# Patient Record
Sex: Male | Born: 1945 | Race: White | Hispanic: No | Marital: Single | Smoking: Current every day smoker
Health system: Southern US, Community
[De-identification: ages and names within clinical notes are randomized; demographics above are authoritative.]

## PROBLEM LIST (undated history)

## (undated) DIAGNOSIS — I10 Essential (primary) hypertension: Secondary | ICD-10-CM

## (undated) DIAGNOSIS — M160 Bilateral primary osteoarthritis of hip: Secondary | ICD-10-CM

## (undated) DIAGNOSIS — M5136 Other intervertebral disc degeneration, lumbar region: Secondary | ICD-10-CM

## (undated) DIAGNOSIS — R51 Headache: Secondary | ICD-10-CM

## (undated) DIAGNOSIS — R519 Headache, unspecified: Secondary | ICD-10-CM

## (undated) HISTORY — DX: Bilateral primary osteoarthritis of hip: M16.0

## (undated) HISTORY — DX: Headache, unspecified: R51.9

## (undated) HISTORY — PX: CATARACT EXTRACTION, BILATERAL: SHX1313

## (undated) HISTORY — DX: Headache: R51

## (undated) HISTORY — DX: Other intervertebral disc degeneration, lumbar region: M51.36

## (undated) HISTORY — DX: Essential (primary) hypertension: I10

---

## 1978-01-31 HISTORY — PX: WISDOM TOOTH EXTRACTION: SHX21

## 2017-02-01 ENCOUNTER — Encounter: Payer: Self-pay | Admitting: Family Medicine

## 2017-02-01 ENCOUNTER — Ambulatory Visit (INDEPENDENT_AMBULATORY_CARE_PROVIDER_SITE_OTHER): Payer: PRIVATE HEALTH INSURANCE | Admitting: Family Medicine

## 2017-02-01 VITALS — BP 196/118 | HR 86 | Temp 97.4°F | Resp 16 | Wt 184.0 lb

## 2017-02-01 DIAGNOSIS — Z23 Encounter for immunization: Secondary | ICD-10-CM

## 2017-02-01 DIAGNOSIS — M160 Bilateral primary osteoarthritis of hip: Secondary | ICD-10-CM

## 2017-02-01 DIAGNOSIS — Z01818 Encounter for other preprocedural examination: Secondary | ICD-10-CM | POA: Diagnosis not present

## 2017-02-01 DIAGNOSIS — F17200 Nicotine dependence, unspecified, uncomplicated: Secondary | ICD-10-CM

## 2017-02-01 DIAGNOSIS — I10 Essential (primary) hypertension: Secondary | ICD-10-CM

## 2017-02-01 LAB — CBC WITH DIFFERENTIAL/PLATELET
BASOS ABS: 0.1 10*3/uL (ref 0.0–0.1)
BASOS PCT: 0.9 % (ref 0.0–3.0)
Eosinophils Absolute: 0.6 10*3/uL (ref 0.0–0.7)
Eosinophils Relative: 7.7 % — ABNORMAL HIGH (ref 0.0–5.0)
HEMATOCRIT: 43.6 % (ref 39.0–52.0)
Hemoglobin: 14.8 g/dL (ref 13.0–17.0)
LYMPHS PCT: 15.3 % (ref 12.0–46.0)
Lymphs Abs: 1.3 10*3/uL (ref 0.7–4.0)
MCHC: 33.8 g/dL (ref 30.0–36.0)
MCV: 96.1 fl (ref 78.0–100.0)
MONOS PCT: 8.1 % (ref 3.0–12.0)
Monocytes Absolute: 0.7 10*3/uL (ref 0.1–1.0)
NEUTROS ABS: 5.6 10*3/uL (ref 1.4–7.7)
NEUTROS PCT: 68 % (ref 43.0–77.0)
PLATELETS: 241 10*3/uL (ref 150.0–400.0)
RBC: 4.54 Mil/uL (ref 4.22–5.81)
RDW: 13.2 % (ref 11.5–15.5)
WBC: 8.3 10*3/uL (ref 4.0–10.5)

## 2017-02-01 LAB — COMPREHENSIVE METABOLIC PANEL
ALBUMIN: 4.8 g/dL (ref 3.5–5.2)
ALT: 10 U/L (ref 0–53)
AST: 19 U/L (ref 0–37)
Alkaline Phosphatase: 84 U/L (ref 39–117)
BUN: 10 mg/dL (ref 6–23)
CALCIUM: 10.2 mg/dL (ref 8.4–10.5)
CHLORIDE: 94 meq/L — AB (ref 96–112)
CO2: 30 meq/L (ref 19–32)
CREATININE: 0.69 mg/dL (ref 0.40–1.50)
GFR: 120.5 mL/min (ref >60.00)
Glucose, Bld: 93 mg/dL (ref 70–99)
Potassium: 4.3 mEq/L (ref 3.5–5.1)
Sodium: 133 mEq/L — ABNORMAL LOW (ref 135–145)
Total Bilirubin: 0.6 mg/dL (ref 0.2–1.2)
Total Protein: 7.4 g/dL (ref 6.0–8.3)

## 2017-02-01 LAB — TSH: TSH: 2.86 u[IU]/mL (ref 0.35–4.50)

## 2017-02-01 MED ORDER — HYDROCHLOROTHIAZIDE 25 MG PO TABS: 25.0000 mg | ORAL_TABLET | Freq: Every day | ORAL | 1 refills | Status: DC

## 2017-02-01 MED ORDER — OXYCODONE HCL 5 MG PO TABS: ORAL_TABLET | ORAL | 0 refills | Status: DC

## 2017-02-01 NOTE — Patient Instructions (Signed)
Buy a blood pressure cuff at your pharmacy (upper arm cuff). Check blood pressure and heart rate twice per day and write these numbers down and bring them back to next office visit to review with me.

## 2017-02-01 NOTE — Progress Notes (Signed)
Office Note 02/01/2017  CC:  Chief Complaint  Patient presents with  . Establish Care    surgical clearance - Right hip surgery    HPI:  Randy Burgess is a 72 y.o.  male who is here accompanied by a friend to establish care and hopes to get preoperative clearance for upcoming total R hip replacement by Dr. Merlyn Albert (03/15/2017). Patient's most recent primary MD: none Old records were not reviewed prior to or during today's visit.  Pt has never gone to the doctor with any regularity, has no prior PCP that we could get records from. Says bp has been elevated at MD visits in the past but he says getting on bp med was never recommended. Has been having hip pain for several years now, much worse the last few months.  He saw a chiropracter for intermittent LBP and he got x-rays of LB and ?hip.  He was referred to Dr. 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 Pain intensity at rest, sitting, is 5/10.  With weight bearing it goes up to 8-9/10.  He walks with a cane primarily, but for long distances he uses a wheelchair.  He has been taking aleve/ibup and aspirin regularly for pain but this doesn't help much at all. He is not (has never been) on any narcotic pain med for his hip pain.  He has no known hx of MI/CAD, CHF, cardiac valvular abnormality, dysrhythmia, heart block, or other cardiac problem. No hx of COPD or asthma or OSA. Before being relatively incapacitated by hip pain, he was able to power walk, go up flights of stairs, and do heavy physical labor in his yard w/out CP or unusual SOB.  No palpitations,dizziness arm pain, jaw pain, or LE swelling.  He has never had surgery before.  No hx of abnormal bleeding.  No hx of thrombosis/PE.   Past Medical History:  Diagnosis Date  . DDD (degenerative disc disease), lumbar   . Elevated blood pressure reading without diagnosis of hypertension   . Frequent headaches    . Osteoarthritis of both hips    R>>L--due for THA 03/15/16 with Dr. Lequita Halt    Past Surgical History:  Procedure Laterality Date  . WISDOM TOOTH EXTRACTION  1980   No excessive bleeding and no problems with the light sedation he was given.    Family History  Problem Relation Age of Onset  . Diabetes Father   . Cancer Daughter     Social History   Socioeconomic History  . Marital status: Single    Spouse name: Not on file  . Number of children: Not on file  . Years of education: Not on file  . Highest education level: Not on file  Social Needs  . Financial resource strain: Not on file  . Food insecurity - worry: Not on file  . Food insecurity - inability: Not on file  . Transportation needs - medical: Not on file  . Transportation needs - non-medical: Not on file  Occupational History  . Not on file  Tobacco Use  . Smoking status: Current Every Day Smoker    Packs/day: 1.00    Years: 55.00    Pack years: 55.00    Types: Cigarettes  . Smokeless tobacco: Current User  Substance and Sexual Activity  . Alcohol use: Yes    Alcohol/week: 4.8 oz    Types: 8 Cans of beer  per week  . Drug use: No  . Sexual activity: Not on file  Other Topics Concern  . Not on file  Social History Narrative   Single, no children.   Educ: college   Occup: retired from [employer].   Born in [place], grew up in [place].   [place]   Tobacco: 50 pack-yr history--current as of 02/01/17.   Alcohol:mild/moderate   No drug use/abuse.    Outpatient Encounter Medications as of 02/01/2017  Medication Sig  . aspirin 325 MG EC tablet Take 325 mg by mouth daily.  . Multiple Vitamin (MULTIVITAMIN) capsule Take 1 capsule by mouth daily.  . hydrochlorothiazide (HYDRODIURIL) 25 MG tablet Take 1 tablet (25 mg total) by mouth daily.  Marland Kitchen. oxyCODONE (OXY IR/ROXICODONE) 5 MG immediate release tablet 1-2 tabs po tid prn moderate to severe pain   No facility-administered encounter medications  on file as of 02/01/2017.     Allergies  Allergen Reactions  . Penicillins   . Sulfa Antibiotics     ROS Review of Systems  Constitutional: Negative for appetite change, chills, fatigue and fever.  HENT: Negative for congestion, dental problem, ear pain and sore throat.   Eyes: Negative for discharge, redness and visual disturbance.  Respiratory: Negative for cough, chest tightness, shortness of breath and wheezing.   Cardiovascular: Negative for chest pain, palpitations and leg swelling.  Gastrointestinal: Negative for abdominal pain, blood in stool, diarrhea, nausea and vomiting.  Genitourinary: Negative for difficulty urinating, dysuria, flank pain, frequency, hematuria and urgency.  Musculoskeletal: Positive for arthralgias (right hip, as per HPI). Negative for back pain, joint swelling, myalgias and neck stiffness.  Skin: Negative for pallor and rash.  Neurological: Negative for dizziness, speech difficulty, weakness and headaches.  Hematological: Negative for adenopathy. Does not bruise/bleed easily.  Psychiatric/Behavioral: Negative for confusion and sleep disturbance. The patient is not nervous/anxious.     PE; Initial bp today was 200/130 Blood pressure (!) 196/118, pulse 86, temperature (!) 97.4 F (36.3 C), temperature source Oral, resp. rate 16, weight 184 lb (83.5 kg), SpO2 99 %. There is no height or weight on file to calculate BMI. (pt could not stand in order to get a height measurement.  Gen: Alert, well appearing.  Patient is oriented to person, place, time, and situation.  Sitting in wheelchair. AFFECT: pleasant, lucid thought and speech. NFA:OZHYENT:Eyes: no injection, icteris, swelling, or exudate.  EOMI, PERRLA. Mouth: lips without lesion/swelling.  Oral mucosa pink and moist. Oropharynx without erythema, exudate, or swelling.  Neck: supple/nontender.  No LAD, mass, or TM.  Carotid pulses 2+ bilaterally, without bruits. CV: RRR, no m/r/g.   LUNGS: CTA bilat, nonlabored  resps, good aeration in all lung fields. ABD: soft, NT, ND, BS normal.  No hepatospenomegaly or mass.  No bruits. EXT: no clubbing, cyanosis, or edema.  SKIN: no pallor or jaundice.  Pertinent labs:  No results found for: TSH No results found for: WBC, HGB, HCT, MCV, PLT No results found for: CREATININE, BUN, NA, K, CL, CO2 No results found for: ALT, AST, GGT, ALKPHOS, BILITOT No results found for: CHOL No results found for: HDL No results found for: LDLCALC No results found for: TRIG No results found for: CHOLHDL No results found for: PSA  12 lead EKG today: NSR, rate 87, poor R wave progression, no ST/T changes, no Q waves. LVH criteria by voltage in aVL.  No ectopy.  PR and QT intervals normal. QRS duration normal. NO prior EKG for  comparison.  ASSESSMENT AND PLAN:   New pt:  Here for preoperative clearance for R THA that is already scheduled for 03/15/17 with Dr. Lequita Halt. Pt has never really gotten any regular preventative medical care or routine medical checks.  1) Uncontrolled HTN: start hctz 25mg  qd. Stop NSAIDS/ASA.   Buy a blood pressure cuff at your pharmacy (upper arm cuff). Check blood pressure and heart rate twice per day and write these numbers down and bring them back to next office visit to review with me. Check CBC, CMET, and TSH today. Need to get bp down into normal range prior to surgical clearance. No cardiac testing planned at this time since functional capacity has been good and he is asymptomatic.  2) Bilat hip DJD, end stage on R---start oxycodone 5mg , 1-2 tid prn pain, #60.  Stop NSAIDs as stated above. Plan for R TKA as stated above.  3) Tobacco dependence: not covered too much today. Will address further at future visits---benefit of quitting even for a week or two prior to surgery --better healing, less chance of DVT.  4) Preventative health: Tdap and flu vaccines given today. Offer shingrix at future visit, although we'll likely hold off until  after his hip surgery to give this. Spent 45 min with pt today, with >50% of this time spent in counseling and care coordination regarding the above problems.  An After Visit Summary was printed and given to the patient.  Return in about 7 days (around 02/08/2017) for f/u uncontrolled HTN.  Signed:  Santiago Bumpers, MD           02/01/2017

## 2017-02-08 ENCOUNTER — Ambulatory Visit (INDEPENDENT_AMBULATORY_CARE_PROVIDER_SITE_OTHER): Payer: PRIVATE HEALTH INSURANCE | Admitting: Family Medicine

## 2017-02-08 ENCOUNTER — Encounter: Payer: Self-pay | Admitting: *Deleted

## 2017-02-08 VITALS — BP 166/106 | HR 91 | Temp 97.6°F | Resp 16 | Wt 182.5 lb

## 2017-02-08 DIAGNOSIS — I10 Essential (primary) hypertension: Secondary | ICD-10-CM

## 2017-02-08 MED ORDER — AMLODIPINE BESY-BENAZEPRIL HCL 10-20 MG PO CAPS
1.0000 | ORAL_CAPSULE | Freq: Every day | ORAL | 1 refills | Status: AC
Start: 2017-02-08 — End: ?

## 2017-02-08 NOTE — Patient Instructions (Signed)
-   STOP taking aspirin   -

## 2017-02-08 NOTE — Progress Notes (Signed)
OFFICE VISIT  02/08/2017   CC:  Chief Complaint  Patient presents with  . Follow-up    HTN   HPI:    Patient is a 72 y.o. Caucasian male who presents accompanied by a friend for 7 day f/u uncontrolled HTN. Started HCTZ 25mg  qd last visit.  We need to get his bp consistently <140/90 before he can get his upcoming R THR that is scheduled for 03/15/17 with Dr. Lequita HaltAluisio. CBC, CMET, TSH all good at visit 1 week ago.  Compliant with bp med, home bp's still up 160s-190s over 90s-100s. No HA's, dizziness, CP, or SOB. No focal weakness. No side effect from med.  Past Medical History:  Diagnosis Date  . DDD (degenerative disc disease), lumbar   . Essential hypertension    + LVH on EKG 02/01/17.  Started hctz 25mg  qd 02/01/17.  . Frequent headaches   . Osteoarthritis of both hips    R>>L--due for THA 03/15/16 with Dr. Lequita HaltAluisio    Past Surgical History:  Procedure Laterality Date  . CATARACT EXTRACTION, BILATERAL    . WISDOM TOOTH EXTRACTION  1980   No excessive bleeding and no problems with the light sedation he was given.    Outpatient Medications Prior to Visit  Medication Sig Dispense Refill  . hydrochlorothiazide (HYDRODIURIL) 25 MG tablet Take 1 tablet (25 mg total) by mouth daily. 30 tablet 1  . Multiple Vitamin (MULTIVITAMIN) capsule Take 1 capsule by mouth daily.    Marland Kitchen. oxyCODONE (OXY IR/ROXICODONE) 5 MG immediate release tablet 1-2 tabs po tid prn moderate to severe pain 60 tablet 0  . aspirin 325 MG EC tablet Take 325 mg by mouth daily.     No facility-administered medications prior to visit.     Allergies  Allergen Reactions  . Penicillins   . Sulfa Antibiotics     ROS As per HPI  PE: Blood pressure (!) 166/106, pulse 91, temperature 97.6 F (36.4 C), temperature source Oral, resp. rate 16, weight 182 lb 8 oz (82.8 kg), SpO2 95 %. Gen: Alert, well appearing.  Patient is oriented to person, place, time, and situation. AFFECT: pleasant, lucid thought and speech. CV:  RRR, no m/r/g.   LUNGS: CTA bilat, nonlabored resps, good aeration in all lung fields. EXT: no clubbing, cyanosis, or edema.    LABS:  Lab Results  Component Value Date   TSH 2.86 02/01/2017   Lab Results  Component Value Date   WBC 8.3 02/01/2017   HGB 14.8 02/01/2017   HCT 43.6 02/01/2017   MCV 96.1 02/01/2017   PLT 241.0 02/01/2017   Lab Results  Component Value Date   CREATININE 0.69 02/01/2017   BUN 10 02/01/2017   NA 133 (L) 02/01/2017   K 4.3 02/01/2017   CL 94 (L) 02/01/2017   CO2 30 02/01/2017   Lab Results  Component Value Date   ALT 10 02/01/2017   AST 19 02/01/2017   ALKPHOS 84 02/01/2017   BILITOT 0.6 02/01/2017    IMPRESSION AND PLAN:  Uncontrolled HTN: not much improved on hctz 25mg  qd for the last 1 wk. Continue hctz 25mg  and add lotrel 10-20 qd. Stop aspirin--he doesn't really have any indication for this med. Continue home bp and HR monitoring.  An After Visit Summary was printed and given to the patient.  FOLLOW UP: Return for f/u HTN in 12-14d. --recheck BMET at that time.  Signed:  Santiago BumpersPhil Maija Biggers, MD           02/08/2017

## 2017-02-09 ENCOUNTER — Other Ambulatory Visit: Payer: Self-pay | Admitting: Family Medicine

## 2017-02-09 MED ORDER — OXYCODONE HCL 5 MG PO TABS: ORAL_TABLET | ORAL | 0 refills | Status: DC

## 2017-02-09 NOTE — Telephone Encounter (Signed)
Copied from CRM [phone]. Topic: Quick Communication - Rx Refill/Question >> Feb 09, 2017  1:39 PM Eston Mouldavis, Lev Cervone B wrote: Medication: oxyCODONE (OXY IR/ROXICODONE) 5 MG immediate release tablet   Has the patient contacted their pharmacy? yes   (Agent: If no, request that the patient contact the pharmacy for the refill.)   Preferred Pharmacy (with phone number or street name): [id number] [place], [place] - [address] [phone] (Phone) [phone] (Fax)  **   Agent: Please be advised that RX refills may take up to 3 business days. We ask that you follow-up with your pharmacy.

## 2017-02-09 NOTE — Telephone Encounter (Signed)
RF request for oxycodone LOV: 02/08/17 Next ov: 02/20/17 Last written: 02/01/17 #60 w/ 0RF  Please advise. Thanks.

## 2017-02-09 NOTE — Telephone Encounter (Signed)
Rx put up front for p/u. Pt advised and voiced understanding.   

## 2017-02-14 ENCOUNTER — Ambulatory Visit: Payer: Self-pay | Admitting: Orthopedic Surgery

## 2017-02-14 ENCOUNTER — Encounter: Payer: Self-pay | Admitting: Family Medicine

## 2017-02-20 ENCOUNTER — Encounter: Payer: Self-pay | Admitting: Family Medicine

## 2017-02-20 ENCOUNTER — Ambulatory Visit (INDEPENDENT_AMBULATORY_CARE_PROVIDER_SITE_OTHER): Payer: PRIVATE HEALTH INSURANCE | Admitting: Family Medicine

## 2017-02-20 VITALS — BP 134/75 | HR 85 | Temp 97.6°F | Resp 16

## 2017-02-20 DIAGNOSIS — I10 Essential (primary) hypertension: Secondary | ICD-10-CM

## 2017-02-20 NOTE — Progress Notes (Signed)
OFFICE VISIT  02/20/2017   CC:  Chief Complaint  Patient presents with  . Follow-up    HTN   HPI:    Patient is a 72 y.o. Caucasian male who presents for f/u HTN. Last visit (17 days ago) I added lotrel 10-20 qd to his regimen (he was only on hctz 25mg  qd at that time). Goal BP prior to surgical clearance is < 140/90 consistently.  Home bp monitoring: gradually came down over the last 1 week to 130s/70s. He feels well other than his hip pains. He does not need any oxycodone at this time: he has some at home and is using this regularly.  Past Medical History:  Diagnosis Date  . DDD (degenerative disc disease), lumbar   . Essential hypertension    + LVH on EKG 02/01/17.  Started hctz 25mg  qd 02/01/17.  . Frequent headaches   . 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 Lequita HaltAluisio    Past Surgical History:  Procedure Laterality Date  . CATARACT EXTRACTION, BILATERAL    . WISDOM TOOTH EXTRACTION  1980   No excessive bleeding and no problems with the light sedation he was given.    Outpatient Medications Prior to Visit  Medication Sig Dispense Refill  . amLODipine-benazepril (LOTREL) 10-20 MG capsule Take 1 capsule by mouth daily. 30 capsule 1  . hydrochlorothiazide (HYDRODIURIL) 25 MG tablet Take 1 tablet (25 mg total) by mouth daily. 30 tablet 1  . Multiple Vitamin (MULTIVITAMIN) capsule Take 1 capsule by mouth daily.    Marland Kitchen. oxyCODONE (OXY IR/ROXICODONE) 5 MG immediate release tablet 1-2 tabs po tid prn moderate to severe pain 60 tablet 0   No facility-administered medications prior to visit.     Allergies  Allergen Reactions  . Penicillins   . Sulfa Antibiotics     ROS As per HPI  PE: Blood pressure 134/75, pulse 85, temperature 97.6 F (36.4 C), temperature source Oral, resp. rate 16, SpO2 100 %. Gen: Alert, well appearing.  Patient is oriented to person, place, time, and situation. AFFECT:  pleasant, lucid thought and speech. No further exam today.  LABS:    Chemistry      Component Value Date/Time   NA 133 (L) 02/01/2017 1510   K 4.3 02/01/2017 1510   CL 94 (L) 02/01/2017 1510   CO2 30 02/01/2017 1510   BUN 10 02/01/2017 1510   CREATININE 0.69 02/01/2017 1510      Component Value Date/Time   CALCIUM 10.2 02/01/2017 1510   ALKPHOS 84 02/01/2017 1510   AST 19 02/01/2017 1510   ALT 10 02/01/2017 1510   BILITOT 0.6 02/01/2017 1510     IMPRESSION AND PLAN:  Essential HTN: now controlled fine--I'll fill out his form clearing him for surgery. He'll continue current bp meds. Instructions:  If your blood pressure at home is consistently >150 on top or >90 on bottom, call our office or make a return appointment.    FOLLOW UP: Return in about 3 months (around 05/21/2017) for routine chronic illness f/u.  Signed:  Santiago BumpersPhil Jakita Dutkiewicz, MD           02/20/2017

## 2017-02-20 NOTE — Patient Instructions (Signed)
If your blood pressure at home is consistently >150 on top or >90 on bottom, call our office or make a return appointment.    Good luck with your surgery!

## 2017-02-21 ENCOUNTER — Other Ambulatory Visit: Payer: Self-pay | Admitting: Family Medicine

## 2017-02-21 NOTE — Telephone Encounter (Signed)
Oxycodone refill. Chart shows that prescription was printed to be filled on 02/09/17. Pt has contacted pharmacy and pharmacy has not received medication. Last OV 02/20/17.

## 2017-02-21 NOTE — Telephone Encounter (Signed)
Pt was advised on 02/09/17 that his prescription was ready for pick up at our front desk. Pt advised again and advised that we are unable to send this medication to his pharmacy because it is a controlled medication. Pt voiced understanding.

## 2017-02-21 NOTE — Telephone Encounter (Signed)
Copied from CRM [phone]. Topic: Quick Communication - Rx Refill/Question >> Feb 21, 2017 11:36 AM Jolayne Hainesaylor, Brittany L wrote: Medication: oxyCODONE (OXY IR/ROXICODONE) 5 MG immediate release tablet  Has the patient contacted their pharmacy? [facility] & they told him to call provider   (Agent: If no, request that the patient contact the pharmacy for the refill.)   Preferred Pharmacy (with phone number or street name): [id number] - [place], [place] - [address]   Agent: Please be advised that RX refills may take up to 3 business days. We ask that you follow-up with your pharmacy.

## 2017-02-23 ENCOUNTER — Telehealth: Payer: Self-pay | Admitting: Family Medicine

## 2017-02-23 MED ORDER — OXYCODONE HCL 5 MG PO TABS: ORAL_TABLET | ORAL | 0 refills | Status: DC

## 2017-02-23 NOTE — Telephone Encounter (Signed)
Pt called In in regards to his medication pick up. Pt says that he sent his friend for pick up and was told that Rx wasn't there. Spoke with office and was informed that Rx discussed was picked up on 02/10/17. Explained to pt, he expressed understanding.   Pt would like to know if provider could provide him with another (new) Rx as he needs more pain med? When speaking with office they did advise that request was sent to provider. I informed pt that office would call him when/ if able to supply and when Rx is  Ready for pick up.    Pt says that he would like to have a Rx available to him as soon as possible.

## 2017-02-23 NOTE — Telephone Encounter (Signed)
Oxycodone rx printed. 

## 2017-02-23 NOTE — Telephone Encounter (Signed)
Patient notified that prescription is ready for pick up. He verbalized understanding.

## 2017-02-23 NOTE — Telephone Encounter (Signed)
Rx placed at front desk.   Pt advised.

## 2017-02-23 NOTE — Telephone Encounter (Signed)
Patient's friend, Gladstone Pihlias came into the office this afternoon stating pt was supposed to have RX for oxycodone ready.  Patient's friend states that he was told that the RX would be ready.  Patient was called yesterday and told to pick up rx.  RX for 02/09/17 was already picked up on 02/10/17.    Please advise.

## 2017-02-24 ENCOUNTER — Other Ambulatory Visit (HOSPITAL_COMMUNITY): Payer: Self-pay | Admitting: *Deleted

## 2017-02-24 NOTE — Patient Instructions (Signed)
Randy PickerelKenneth Burgess  02/24/2017   Your procedure is scheduled on: 03-15-17  [facility]  [facility]tr[facility]  Report to admitting at 1000 AM  Call this number if you have problems the morning of surgery [phone]   Remember: Do not eat food or drink liquids :After Midnight.     Take these medicines the morning of surgery with A SIP OF WATER:  Oxycodone IR if needed, eye drop if needed, ranitidine (zantac)                                You may not have any metal on your body including hair pins and              piercings  Do not wear jewelry, make-up, lotions, powders or perfumes, deodorant             Do not wear nail polish.  Do not shave  48 hours prior to surgery.              Men may shave face and neck.   Do not bring valuables to the hospital. [facility] IS NOT             RESPONSIBLE   FOR VALUABLES.  Contacts, dentures or bridgework may not be worn into surgery.  Leave suitcase in the car. After surgery it may be brought to your room.                  Please read over the following fact sheets you were given: _____________________________________________________________________             [facility] - Preparing for [facility] Before surgery, you can play an important role.  Because skin is not sterile, your skin needs to be as free of germs as possible.  You can reduce the number of germs on your skin by washing with CHG (chlorahexidine gluconate) soap before surgery.  CHG is an antiseptic cleaner which kills germs and bonds with the skin to continue killing germs even after washing. Please DO NOT use if you have an allergy to CHG or antibacterial soaps.  If your skin becomes reddened/irritated stop using the CHG and inform your nurse when you arrive at Short Stay. Do not shave (including legs and underarms) for at least 48 hours prior to the first CHG shower.  You may shave your face/neck. Please follow these instructions carefully:  1.  Shower  with CHG Soap the night before surgery and the  morning of Surgery.  2.  If you choose to wash your hair, wash your hair first as usual with your  normal  shampoo.  3.  After you shampoo, rinse your hair and body thoroughly to remove the  shampoo.                           4.  Use CHG as you would any other liquid soap.  You can apply chg directly  to the skin and wash                       Gently with a scrungie or clean washcloth.  5.  Apply the CHG Soap to your body ONLY FROM THE NECK DOWN.   Do not use on face/ open  Wound or open sores. Avoid contact with eyes, ears mouth and genitals (private parts).                       Wash face,  Genitals (private parts) with your normal soap.             6.  Wash thoroughly, paying special attention to the area where your surgery  will be performed.  7.  Thoroughly rinse your body with warm water from the neck down.  8.  DO NOT shower/wash with your normal soap after using and rinsing off  the CHG Soap.                9.  Pat yourself dry with a clean towel.            10.  Wear clean pajamas.            11.  Place clean sheets on your bed the night of your first shower and do not  sleep with pets. Day of Surgery : Do not apply any lotions/deodorants the morning of surgery.  Please wear clean clothes to the hospital/surgery center.  FAILURE TO FOLLOW THESE INSTRUCTIONS MAY RESULT IN THE CANCELLATION OF YOUR SURGERY PATIENT SIGNATURE_________________________________  NURSE SIGNATURE__________________________________  ________________________________________________________________________   Randy Burgess  An incentive spirometer is a tool that can help keep your lungs clear and active. This tool measures how well you are filling your lungs with each breath. Taking long deep breaths may help reverse or decrease the chance of developing breathing (pulmonary) problems (especially infection) following:  A long period  of time when you are unable to move or be active. BEFORE THE PROCEDURE   If the spirometer includes an indicator to show your best effort, your nurse or respiratory therapist will set it to a desired goal.  If possible, sit up straight or lean slightly forward. Try not to slouch.  Hold the incentive spirometer in an upright position. INSTRUCTIONS FOR USE  1. Sit on the edge of your bed if possible, or sit up as far as you can in bed or on a chair. 2. Hold the incentive spirometer in an upright position. 3. Breathe out normally. 4. Place the mouthpiece in your mouth and seal your lips tightly around it. 5. Breathe in slowly and as deeply as possible, raising the piston or the ball toward the top of the column. 6. Hold your breath for 3-5 seconds or for as long as possible. Allow the piston or ball to fall to the bottom of the column. 7. Remove the mouthpiece from your mouth and breathe out normally. 8. Rest for a few seconds and repeat Steps 1 through 7 at least 10 times every 1-2 hours when you are awake. Take your time and take a few normal breaths between deep breaths. 9. The spirometer may include an indicator to show your best effort. Use the indicator as a goal to work toward during each repetition. 10. After each set of 10 deep breaths, practice coughing to be sure your lungs are clear. If you have an incision (the cut made at the time of surgery), support your incision when coughing by placing a pillow or rolled up towels firmly against it. Once you are able to get out of bed, walk around indoors and cough well. You may stop using the incentive spirometer when instructed by your caregiver.  RISKS AND COMPLICATIONS  Take your time so you do not get  dizzy or light-headed.  If you are in pain, you may need to take or ask for pain medication before doing incentive spirometry. It is harder to take a deep breath if you are having pain. AFTER USE  Rest and breathe slowly and easily.  It  can be helpful to keep track of a log of your progress. Your caregiver can provide you with a simple table to help with this. If you are using the spirometer at home, follow these instructions: [place] IF:   You are having difficultly using the spirometer.  You have trouble using the spirometer as often as instructed.  Your pain medication is not giving enough relief while using the spirometer.  You develop fever of 100.5 F (38.1 C) or higher. SEEK IMMEDIATE MEDICAL CARE IF:   You cough up bloody sputum that had not been present before.  You develop fever of 102 F (38.9 C) or greater.  You develop worsening pain at or near the incision site. MAKE SURE YOU:   Understand these instructions.  Will watch your condition.  Will get help right away if you are not doing well or get worse. Document Released: 05/30/2006 Document Revised: 04/11/2011 Document Reviewed: 07/31/2006 ExitCare Patient Information 2014 ExitCare, [place].   ________________________________________________________________________  WHAT IS A BLOOD TRANSFUSION? Blood Transfusion Information  A transfusion is the replacement of blood or some of its parts. Blood is made up of multiple cells which provide different functions.  Red blood cells carry oxygen and are used for blood loss replacement.  White blood cells fight against infection.  Platelets control bleeding.  Plasma helps clot blood.  Other blood products are available for specialized needs, such as hemophilia or other clotting disorders. BEFORE THE TRANSFUSION  Who gives blood for transfusions?   Healthy volunteers who are fully evaluated to make sure their blood is safe. This is blood bank blood. Transfusion therapy is the safest it has ever been in the practice of medicine. Before blood is taken from a donor, a complete history is taken to make sure that person has no history of diseases nor engages in risky social behavior (examples  are intravenous drug use or sexual activity with multiple partners). The donor's travel history is screened to minimize risk of transmitting infections, such as malaria. The donated blood is tested for signs of infectious diseases, such as HIV and hepatitis. The blood is then tested to be sure it is compatible with you in order to minimize the chance of a transfusion reaction. If you or a relative donates blood, this is often done in anticipation of surgery and is not appropriate for emergency situations. It takes many days to process the donated blood. RISKS AND COMPLICATIONS Although transfusion therapy is very safe and saves many lives, the main dangers of transfusion include:   Getting an infectious disease.  Developing a transfusion reaction. This is an allergic reaction to something in the blood you were given. Every precaution is taken to prevent this. The decision to have a blood transfusion has been considered carefully by your caregiver before blood is given. Blood is not given unless the benefits outweigh the risks. AFTER THE TRANSFUSION  Right after receiving a blood transfusion, you will usually feel much better and more energetic. This is especially true if your red blood cells have gotten low (anemic). The transfusion raises the level of the red blood cells which carry oxygen, and this usually causes an energy increase.  The nurse administering the transfusion will  monitor you carefully for complications. HOME CARE INSTRUCTIONS  No special instructions are needed after a transfusion. You may find your energy is better. Speak with your caregiver about any limitations on activity for underlying diseases you may have. SEEK MEDICAL CARE IF:   Your condition is not improving after your transfusion.  You develop redness or irritation at the intravenous (IV) site. SEEK IMMEDIATE MEDICAL CARE IF:  Any of the following symptoms occur over the next 12 hours:  Shaking chills.  You have a  temperature by mouth above 102 F (38.9 C), not controlled by medicine.  Chest, back, or muscle pain.  People around you feel you are not acting correctly or are confused.  Shortness of breath or difficulty breathing.  Dizziness and fainting.  You get a rash or develop hives.  You have a decrease in urine output.  Your urine turns a dark color or changes to pink, red, or brown. Any of the following symptoms occur over the next 10 days:  You have a temperature by mouth above 102 F (38.9 C), not controlled by medicine.  Shortness of breath.  Weakness after normal activity.  The white part of the eye turns yellow (jaundice).  You have a decrease in the amount of urine or are urinating less often.  Your urine turns a dark color or changes to pink, red, or brown. Document Released: 01/15/2000 Document Revised: 04/11/2011 Document Reviewed: 09/03/2007 [facility] Patient Information 2014 [place], [place].  _______________________________________________________________________

## 2017-02-24 NOTE — Progress Notes (Signed)
ekg 02-01-17 and  medical clearance note dr Milinda Cavemcgowen epic

## 2017-02-28 ENCOUNTER — Other Ambulatory Visit: Payer: Self-pay

## 2017-02-28 ENCOUNTER — Encounter (HOSPITAL_COMMUNITY): Payer: Self-pay

## 2017-02-28 ENCOUNTER — Encounter (HOSPITAL_COMMUNITY)
Admission: RE | Admit: 2017-02-28 | Discharge: 2017-02-28 | Disposition: A | Discharge status: Home / Self Care | Payer: 59 | Source: Ambulatory Visit | Attending: Orthopedic Surgery | Admitting: Orthopedic Surgery

## 2017-02-28 DIAGNOSIS — Z01812 Encounter for preprocedural laboratory examination: Secondary | ICD-10-CM | POA: Insufficient documentation

## 2017-02-28 LAB — COMPREHENSIVE METABOLIC PANEL
ALT: 17 U/L (ref 17–63)
ANION GAP: 11 (ref 5–15)
AST: 24 U/L (ref 15–41)
Albumin: 4.7 g/dL (ref 3.5–5.0)
Alkaline Phosphatase: 78 U/L (ref 38–126)
BUN: 9 mg/dL (ref 6–20)
CHLORIDE: 91 mmol/L — AB (ref 101–111)
CO2: 22 mmol/L (ref 22–32)
Calcium: 10.2 mg/dL (ref 8.9–10.3)
Creatinine, Ser: 0.58 mg/dL — ABNORMAL LOW (ref 0.61–1.24)
GFR calc Af Amer: >60 mL/min (ref >60)
Glucose, Bld: 106 mg/dL — ABNORMAL HIGH (ref 65–99)
POTASSIUM: 3.6 mmol/L (ref 3.5–5.1)
Sodium: 124 mmol/L — ABNORMAL LOW (ref 135–145)
Total Bilirubin: 0.4 mg/dL (ref 0.3–1.2)
Total Protein: 7.6 g/dL (ref 6.5–8.1)

## 2017-02-28 LAB — PROTIME-INR
INR: 1.02
PROTHROMBIN TIME: 13.3 s (ref 11.4–15.2)

## 2017-02-28 LAB — SURGICAL PCR SCREEN
MRSA, PCR: NEGATIVE
Staphylococcus aureus: POSITIVE — AB

## 2017-02-28 LAB — CBC
HCT: 35.7 % — ABNORMAL LOW (ref 39.0–52.0)
Hemoglobin: 13.1 g/dL (ref 13.0–17.0)
MCH: 31.7 pg (ref 26.0–34.0)
MCHC: 36.7 g/dL — ABNORMAL HIGH (ref 30.0–36.0)
MCV: 86.4 fL (ref 78.0–100.0)
PLATELETS: 261 10*3/uL (ref 150–400)
RBC: 4.13 MIL/uL — ABNORMAL LOW (ref 4.22–5.81)
RDW: 12.1 % (ref 11.5–15.5)
WBC: 7.2 10*3/uL (ref 4.0–10.5)

## 2017-02-28 LAB — APTT: APTT: 30 s (ref 24–36)

## 2017-03-09 ENCOUNTER — Other Ambulatory Visit: Payer: Self-pay | Admitting: Family Medicine

## 2017-03-09 MED ORDER — OXYCODONE HCL 5 MG PO TABS: ORAL_TABLET | ORAL | 0 refills | Status: DC

## 2017-03-09 NOTE — Telephone Encounter (Signed)
RF request for oxycodone LOV: 02/23/17 Next ov: None Last written: 02/23/17 #90 w/ 0RF  Please advise. Thanks.

## 2017-03-09 NOTE — Telephone Encounter (Signed)
Rx put up front for p/u. Pt advised and voiced understanding.   

## 2017-03-09 NOTE — Telephone Encounter (Signed)
Copied from CRM #[id number]. Topic: Quick Communication - Rx Refill/Question >> Mar 09, 2017 11:17 AM Lelon FrohlichGolden, Tashia, RMA wrote: Medication: oxycodone 5 mg   Has the patient contacted their pharmacy? No controlled   (Agent: If no, request that the patient contact the pharmacy for the refill.)   Preferred Pharmacy (with phone number or street name[facility] [place] [address]   Agent: Please be advised that RX refills may take up to 3 business days. We ask that you follow-up with your pharmacy.

## 2017-03-12 ENCOUNTER — Ambulatory Visit: Payer: Self-pay | Admitting: Orthopedic Surgery

## 2017-03-12 NOTE — H&P (View-Only) (Signed)
Randy Burgess, Randy Burgess(72yo, [place])  DOB [birth year]   Chief Complaint Right Hip Pain   Patient's Care Team [facility] [place]: [address], [place], [place] [id number], Ph [phone], Fax [phone]  [facility]'s Pharmacies [facility] [id number] [address], [place] [id number], Ph [phone]) [phone], Fax [phone]) [phone]   Vitals Ht: 5 ft 11 in 02/28/2017 05:09 pm Wt: 179 lbs 02/28/2017 05:16 pm BMI: 25 02/28/2017 05:16 pm BP - 152/78 Pulse - 84   Allergies Reviewed Allergies PENICILLINS: - Childhood RXN  SULFA (SULFONAMIDE ANTIBIOTICS): - Childhood RXN    Medications Reviewed Medications amlodipine 10 mg-atorvastatin 10 mg tablet 02/28/17   entered ALEXZANDREW PERKINS, PA-C amlodipine 10 mg-benazepril 20 mg capsule 02/08/17   filled [facility] Daily Multi-Vitamin 02/28/17   entered ALEXZANDREW PERKINS, PA-C hydroCHLOROthiazide 25 mg tablet 02/28/17   filled [facility] oxyCODONE 5 mg tablet 02/24/17   filled [facility]   Problems Reviewed Problems Osteoarthritis of hip    Family History Reviewed Family History Father - Diabetes mellitus (died age: 1965)   Social History Reviewed Social History Smoking Status: Current every day smoker Smoker (1 PPD) Tobacco-years of use: 50 Chewing tobacco: none Alcohol intake: Occasional Hand Dominance: Right Work related injury?: N Advance directive: N Medical Power of Attorney: N   Surgical History Reviewed Surgical History Cataract    Past Medical History Reviewed Past Medical History Headaches: Y Joint Pain: Y    HPI Patient is a 72 year old male who is scheduled for a right total hip replacement arthroplasty by Dr. Ollen GrossFrank Aluisio on March 15[facility]. The patient is a 72 year old male who was seen in referral from Dr. Sherrie SportBoudreau. He has been having progressively worsening pain in the right groin and thigh over the past few years. He uses a cane to walk but his  activity is severely limited due to his pain. He used to be very active. He reports that he does not have much left hip pain. He has seen a chiropractor previously as there was some thought that the pain was coming from his low back. He did recently get x-rays of his hips and was referred here. He has never had injections or surgery on the hips. He has pain in the right groin that radiates into the anterior thigh and to the right knee. He has some right buttocks pain and right lateral hip pain as well. No numbness or tingling in the legs. He will take Ibuprofen from time to time but it does not make a huge improvement in his symptoms. 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 RIGHT is more symptomatic so we will do that first. He asked about doing both the same time and is not a candidate to do that. We will proceed with the RIGHT hip as planned and based on how he does with the RIGHT we will set up the LEFT one shortly afterwards. We discussed the procedure, risks, potential complications and rehab course in detail and will proceed with surgery as planned   ROS Constitutional: Constitutional: no significant weight gain or loss and no fever.  HEENT: Eyes: no irritation, dry eyes, vision change, or sore throat.  Cardiovascular: Cardiovascular: no palpitations or chest pain.  Respiratory: Respiratory: no cough or shortness of breath and No COPD.  Gastrointestinal: Gastrointestinal: no vomiting or diarrhea and not vomiting blood.  Genitourinary: Genitourinary: no blood in urine or difficulty urinating.  Musculoskeletal: Musculoskeletal: Joint  Pain.   Physical Exam Patient is a 72 year old male.  General Patient is seen and examined in a wheelchair. Mental Status - Alert, cooperative and good historian. General Appearance - pleasant, Not in acute distress. Orientation - Oriented X3. Build & Nutrition - Well nourished and Well developed.  Head and  Neck Head - normocephalic, atraumatic . Neck Global Assessment - supple, no bruit auscultated on the right, no bruit auscultated on the left.  Eye Pupil - Bilateral - PERR Motion - Bilateral - EOMI.  Chest and Lung Exam Auscultation Breath sounds - clear at anterior chest wall and clear at posterior chest wall. Adventitious sounds - No Adventitious sounds.  Cardiovascular Auscultation Rhythm - Regular rate and rhythm. Heart Sounds - S1 WNL and S2 WNL. Murmurs & Other Heart Sounds - Auscultation of the heart reveals - No Murmurs.  Abdomen Palpation/Percussion Tenderness - Abdomen is non-tender to palpation. Abdomen is soft. Auscultation Auscultation of the abdomen reveals - Bowel sounds normal.  Male Genitourinary Note: Not done, not pertinent to present illness  Musculoskeletal He has pain with passive and active motion of the right hip which is significantly limited. Flexion 100 degrees, no internal rotation, external rotation and abduction 15 degrees. He has some tenderness over the right greater trochanteric bursa. No tenderness over the left. He has no pain with motion of the left hip but motion is comparable to that of the right hip. Normal motion of the knees. No tenderness or effusion in the knees. Sensation and motor function intact.   Radiographs: AP and lateral views of the hips show severe degeneration of both the right and left hips, right greater than left. Both femoral heads have collapse with significant cystic change vs necrosis present. Significant spurring noted about the acetabular rim, most notably along the inferior rim.    Assessment / Plan 1. Osteoarthritis of right hip joint M16.11: Unilateral primary osteoarthritis, right hip  2. Osteoarthritis of left hip joint M16.12: Unilateral primary osteoarthritis, left hip  Patient Instructions Surgical Plans: Right Total Hip Replacement - Anterior Approach Disposition: Home, HHPT vs. HEP PCP: [facility] Anesthesia Issues: None Patient was instructed on what medications to stop prior to surgery. - Follow up visit in 2 weeks with Dr. Lequita Halt - Begin physical therapy following surgery - Pre-operative lab work as pre Pre-Surgical Testing - Prescriptions will be provided in hospital at time of discharge  Return to Office Ollen Gross, MD for Post-Op at [facility] on 03/28/2017 at 02:00 PM  Encounter signed-off by Patrica Duel, PA-C

## 2017-03-12 NOTE — H&P (Signed)
Randy Burgess, Randy Burgess(72yo, [place])  DOB [birth year]   Chief Complaint Right Hip Pain   Patient's Care Team [facility] [place] [address], [place], [place] [id number], Ph [phone], Fax [phone]  Patient's Pharmacies [facility] [phone]): [address], [place] [id number], Ph (336) [phone], Fax [phone]) [phone]   Vitals Ht: 5 ft 11 in 02/28/2017 05:09 pm Wt: 179 lbs 02/28/2017 05:16 pm BMI: 25 02/28/2017 05:16 pm BP - 152/78 Pulse - 84   Allergies Reviewed Allergies PENICILLINS: - Childhood RXN  SULFA (SULFONAMIDE ANTIBIOTICS): - Childhood RXN    Medications Reviewed Medications amlodipine 10 mg-atorvastatin 10 mg tablet 02/28/17   entered Rebbie Lauricella, PA-C amlodipine 10 mg-benazepril 20 mg capsule 02/08/17   filled [facility] Daily Multi-Vitamin 02/28/17   entered Micole Delehanty, PA-C hydroCHLOROthiazide 25 mg tablet 02/28/17   filled [facility] oxyCODONE 5 mg tablet 02/24/17   filled [facility]   Problems Reviewed Problems Osteoarthritis of hip    Family History Reviewed Family History Father - Diabetes mellitus (died age: 1965)   Social History Reviewed Social History Smoking Status: Current every day smoker Smoker (1 PPD) Tobacco-years of use: 50 Chewing tobacco: none Alcohol intake: Occasional Hand Dominance: Right Work related injury?: N Advance directive: N Medical Power of Attorney: N   Surgical History Reviewed Surgical History Cataract    Past Medical History Reviewed Past Medical History Headaches: Y Joint Pain: Y    HPI Patient is a 72 year old male who is scheduled for a right total hip replacement arthroplasty by Dr. Ollen GrossFrank Aluisio on March 15, 2017 [facility]. The patient is a 72 year old male who was seen in referral from Dr. Sherrie SportBoudreau. He has been having progressively worsening pain in the right groin and thigh over the past few years. He uses a cane to walk but his  activity is severely limited due to his pain. He used to be very active. He reports that he does not have much left hip pain. He has seen a chiropractor previously as there was some thought that the pain was coming from his low back. He did recently get x-rays of his hips and was referred here. He has never had injections or surgery on the hips. He has pain in the right groin that radiates into the anterior thigh and to the right knee. He has some right buttocks pain and right lateral hip pain as well. No numbness or tingling in the legs. He will take Ibuprofen from time to time but it does not make a huge improvement in his symptoms. 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 RIGHT is more symptomatic so we will do that first. He asked about doing both the same time and is not a candidate to do that. We will proceed with the RIGHT hip as planned and based on how he does with the RIGHT we will set up the LEFT one shortly afterwards. We discussed the procedure, risks, potential complications and rehab course in detail and will proceed with surgery as planned   ROS Constitutional: Constitutional: no significant weight gain or loss and no fever.  HEENT: Eyes: no irritation, dry eyes, vision change, or sore throat.  Cardiovascular: Cardiovascular: no palpitations or chest pain.  Respiratory: Respiratory: no cough or shortness of breath and No COPD.  Gastrointestinal: Gastrointestinal: no vomiting or diarrhea and not vomiting blood.  Genitourinary: Genitourinary: no blood in urine or difficulty urinating.  Musculoskeletal: Musculoskeletal: Joint  Pain.   Physical Exam Patient is a 72 year old male.  General Patient is seen and examined in a wheelchair. Mental Status - Alert, cooperative and good historian. General Appearance - pleasant, Not in acute distress. Orientation - Oriented X3. Build & Nutrition - Well nourished and Well developed.  Head and  Neck Head - normocephalic, atraumatic . Neck Global Assessment - supple, no bruit auscultated on the right, no bruit auscultated on the left.  Eye Pupil - Bilateral - PERR Motion - Bilateral - EOMI.  Chest and Lung Exam Auscultation Breath sounds - clear at anterior chest wall and clear at posterior chest wall. Adventitious sounds - No Adventitious sounds.  Cardiovascular Auscultation Rhythm - Regular rate and rhythm. Heart Sounds - S1 WNL and S2 WNL. Murmurs & Other Heart Sounds - Auscultation of the heart reveals - No Murmurs.  Abdomen Palpation/Percussion Tenderness - Abdomen is non-tender to palpation. Abdomen is soft. Auscultation Auscultation of the abdomen reveals - Bowel sounds normal.  Male Genitourinary Note: Not done, not pertinent to present illness  Musculoskeletal He has pain with passive and active motion of the right hip which is significantly limited. Flexion 100 degrees, no internal rotation, external rotation and abduction 15 degrees. He has some tenderness over the right greater trochanteric bursa. No tenderness over the left. He has no pain with motion of the left hip but motion is comparable to that of the right hip. Normal motion of the knees. No tenderness or effusion in the knees. Sensation and motor function intact.   Radiographs: AP and lateral views of the hips show severe degeneration of both the right and left hips, right greater than left. Both femoral heads have collapse with significant cystic change vs necrosis present. Significant spurring noted about the acetabular rim, most notably along the inferior rim.    Assessment / Plan 1. Osteoarthritis of right hip joint M16.11: Unilateral primary osteoarthritis, right hip  2. Osteoarthritis of left hip joint M16.12: Unilateral primary osteoarthritis, left hip  Patient Instructions Surgical Plans: Right Total Hip Replacement - Anterior Approach Disposition: Home, HHPT vs. HEP PCP: [facility] Anesthesia Issues: None Patient was instructed on what medications to stop prior to surgery. - Follow up visit in 2 weeks with Dr. Lequita Halt - Begin physical therapy following surgery - Pre-operative lab work as pre Pre-Surgical Testing - Prescriptions will be provided in hospital at time of discharge  Return to Office Ollen Gross, MD for Post-Op at [facility] on 03/28/2017 at 02:00 PM  Encounter signed-off by Patrica Duel, PA-C

## 2017-03-14 MED ORDER — TRANEXAMIC ACID 1000 MG/10ML IV SOLN
1000.0000 mg | INTRAVENOUS | Status: AC
  Administered 2017-03-15: 1000 mg via INTRAVENOUS
  Filled 2017-03-14: qty 1100

## 2017-03-15 ENCOUNTER — Inpatient Hospital Stay (HOSPITAL_COMMUNITY)
Admission: RE | Admit: 2017-03-15 | Discharge: 2017-03-29 | DRG: 469 | Disposition: A | Discharge status: Skilled Nursing Facility | Payer: Medicare (Managed Care) | Source: Ambulatory Visit | Attending: Internal Medicine | Admitting: Internal Medicine

## 2017-03-15 ENCOUNTER — Ambulatory Visit (HOSPITAL_COMMUNITY): Payer: Medicare (Managed Care) | Admitting: Certified Registered Nurse Anesthetist

## 2017-03-15 ENCOUNTER — Encounter (HOSPITAL_COMMUNITY): Payer: Self-pay | Admitting: Certified Registered Nurse Anesthetist

## 2017-03-15 ENCOUNTER — Ambulatory Visit (HOSPITAL_COMMUNITY): Payer: Medicare (Managed Care)

## 2017-03-15 ENCOUNTER — Inpatient Hospital Stay (HOSPITAL_COMMUNITY): Payer: Medicare (Managed Care)

## 2017-03-15 ENCOUNTER — Encounter (HOSPITAL_COMMUNITY)
Admission: RE | Disposition: A | Discharge status: Skilled Nursing Facility | Payer: Self-pay | Source: Ambulatory Visit | Attending: Orthopedic Surgery

## 2017-03-15 DIAGNOSIS — G4733 Obstructive sleep apnea (adult) (pediatric): Secondary | ICD-10-CM | POA: Diagnosis present

## 2017-03-15 DIAGNOSIS — M5136 Other intervertebral disc degeneration, lumbar region: Secondary | ICD-10-CM | POA: Diagnosis not present

## 2017-03-15 DIAGNOSIS — Z96649 Presence of unspecified artificial hip joint: Secondary | ICD-10-CM

## 2017-03-15 DIAGNOSIS — R222 Localized swelling, mass and lump, trunk: Secondary | ICD-10-CM | POA: Diagnosis not present

## 2017-03-15 DIAGNOSIS — M879 Osteonecrosis, unspecified: Secondary | ICD-10-CM | POA: Diagnosis present

## 2017-03-15 DIAGNOSIS — J9601 Acute respiratory failure with hypoxia: Secondary | ICD-10-CM

## 2017-03-15 DIAGNOSIS — J189 Pneumonia, unspecified organism: Secondary | ICD-10-CM | POA: Diagnosis not present

## 2017-03-15 DIAGNOSIS — F17210 Nicotine dependence, cigarettes, uncomplicated: Secondary | ICD-10-CM | POA: Diagnosis present

## 2017-03-15 DIAGNOSIS — Z9841 Cataract extraction status, right eye: Secondary | ICD-10-CM | POA: Diagnosis not present

## 2017-03-15 DIAGNOSIS — J9600 Acute respiratory failure, unspecified whether with hypoxia or hypercapnia: Secondary | ICD-10-CM

## 2017-03-15 DIAGNOSIS — J9859 Other diseases of mediastinum, not elsewhere classified: Secondary | ICD-10-CM

## 2017-03-15 DIAGNOSIS — G8918 Other acute postprocedural pain: Secondary | ICD-10-CM | POA: Diagnosis not present

## 2017-03-15 DIAGNOSIS — I10 Essential (primary) hypertension: Secondary | ICD-10-CM

## 2017-03-15 DIAGNOSIS — F10239 Alcohol dependence with withdrawal, unspecified: Secondary | ICD-10-CM | POA: Diagnosis not present

## 2017-03-15 DIAGNOSIS — D62 Acute posthemorrhagic anemia: Secondary | ICD-10-CM | POA: Diagnosis not present

## 2017-03-15 DIAGNOSIS — I5033 Acute on chronic diastolic (congestive) heart failure: Secondary | ICD-10-CM | POA: Diagnosis not present

## 2017-03-15 DIAGNOSIS — M40209 Unspecified kyphosis, site unspecified: Secondary | ICD-10-CM | POA: Diagnosis not present

## 2017-03-15 DIAGNOSIS — M160 Bilateral primary osteoarthritis of hip: Principal | ICD-10-CM | POA: Diagnosis present

## 2017-03-15 DIAGNOSIS — R0602 Shortness of breath: Secondary | ICD-10-CM | POA: Diagnosis not present

## 2017-03-15 DIAGNOSIS — J9602 Acute respiratory failure with hypercapnia: Secondary | ICD-10-CM

## 2017-03-15 DIAGNOSIS — I5030 Unspecified diastolic (congestive) heart failure: Secondary | ICD-10-CM | POA: Diagnosis not present

## 2017-03-15 DIAGNOSIS — Z23 Encounter for immunization: Secondary | ICD-10-CM | POA: Diagnosis present

## 2017-03-15 DIAGNOSIS — I5032 Chronic diastolic (congestive) heart failure: Secondary | ICD-10-CM

## 2017-03-15 DIAGNOSIS — E222 Syndrome of inappropriate secretion of antidiuretic hormone: Secondary | ICD-10-CM | POA: Diagnosis not present

## 2017-03-15 DIAGNOSIS — E872 Acidosis: Secondary | ICD-10-CM | POA: Diagnosis not present

## 2017-03-15 DIAGNOSIS — Z9842 Cataract extraction status, left eye: Secondary | ICD-10-CM | POA: Diagnosis not present

## 2017-03-15 DIAGNOSIS — Z96641 Presence of right artificial hip joint: Secondary | ICD-10-CM

## 2017-03-15 DIAGNOSIS — Z833 Family history of diabetes mellitus: Secondary | ICD-10-CM

## 2017-03-15 DIAGNOSIS — M7989 Other specified soft tissue disorders: Secondary | ICD-10-CM | POA: Diagnosis not present

## 2017-03-15 DIAGNOSIS — K5900 Constipation, unspecified: Secondary | ICD-10-CM | POA: Diagnosis not present

## 2017-03-15 DIAGNOSIS — J151 Pneumonia due to Pseudomonas: Secondary | ICD-10-CM | POA: Diagnosis not present

## 2017-03-15 DIAGNOSIS — J810 Acute pulmonary edema: Secondary | ICD-10-CM | POA: Diagnosis not present

## 2017-03-15 DIAGNOSIS — E871 Hypo-osmolality and hyponatremia: Secondary | ICD-10-CM

## 2017-03-15 DIAGNOSIS — J95821 Acute postprocedural respiratory failure: Secondary | ICD-10-CM | POA: Diagnosis not present

## 2017-03-15 DIAGNOSIS — R7881 Bacteremia: Secondary | ICD-10-CM | POA: Diagnosis not present

## 2017-03-15 DIAGNOSIS — G9341 Metabolic encephalopathy: Secondary | ICD-10-CM | POA: Diagnosis not present

## 2017-03-15 DIAGNOSIS — I110 Hypertensive heart disease with heart failure: Secondary | ICD-10-CM | POA: Diagnosis present

## 2017-03-15 DIAGNOSIS — Z882 Allergy status to sulfonamides status: Secondary | ICD-10-CM | POA: Diagnosis not present

## 2017-03-15 DIAGNOSIS — K219 Gastro-esophageal reflux disease without esophagitis: Secondary | ICD-10-CM | POA: Diagnosis present

## 2017-03-15 DIAGNOSIS — N179 Acute kidney failure, unspecified: Secondary | ICD-10-CM | POA: Diagnosis not present

## 2017-03-15 DIAGNOSIS — F419 Anxiety disorder, unspecified: Secondary | ICD-10-CM | POA: Diagnosis not present

## 2017-03-15 DIAGNOSIS — J440 Chronic obstructive pulmonary disease with acute lower respiratory infection: Secondary | ICD-10-CM | POA: Diagnosis not present

## 2017-03-15 DIAGNOSIS — Y95 Nosocomial condition: Secondary | ICD-10-CM | POA: Diagnosis not present

## 2017-03-15 DIAGNOSIS — M1611 Unilateral primary osteoarthritis, right hip: Secondary | ICD-10-CM | POA: Diagnosis not present

## 2017-03-15 DIAGNOSIS — I469 Cardiac arrest, cause unspecified: Secondary | ICD-10-CM | POA: Diagnosis not present

## 2017-03-15 DIAGNOSIS — E876 Hypokalemia: Secondary | ICD-10-CM | POA: Diagnosis not present

## 2017-03-15 DIAGNOSIS — G9340 Encephalopathy, unspecified: Secondary | ICD-10-CM | POA: Diagnosis not present

## 2017-03-15 DIAGNOSIS — G253 Myoclonus: Secondary | ICD-10-CM | POA: Diagnosis not present

## 2017-03-15 DIAGNOSIS — M25551 Pain in right hip: Secondary | ICD-10-CM | POA: Diagnosis present

## 2017-03-15 DIAGNOSIS — M169 Osteoarthritis of hip, unspecified: Secondary | ICD-10-CM | POA: Diagnosis present

## 2017-03-15 DIAGNOSIS — Z978 Presence of other specified devices: Secondary | ICD-10-CM | POA: Diagnosis not present

## 2017-03-15 DIAGNOSIS — Z880 Allergy status to penicillin: Secondary | ICD-10-CM

## 2017-03-15 DIAGNOSIS — G931 Anoxic brain damage, not elsewhere classified: Secondary | ICD-10-CM | POA: Diagnosis not present

## 2017-03-15 DIAGNOSIS — R579 Shock, unspecified: Secondary | ICD-10-CM | POA: Diagnosis not present

## 2017-03-15 HISTORY — PX: TOTAL HIP ARTHROPLASTY: SHX124

## 2017-03-15 LAB — POCT I-STAT 7, (LYTES, BLD GAS, ICA,H+H)
Acid-Base Excess: 1 mmol/L (ref 0.0–2.0)
Bicarbonate: 28 mmol/L (ref 20.0–28.0)
Calcium, Ion: 1.28 mmol/L (ref 1.15–1.40)
HCT: 37 % — ABNORMAL LOW (ref 39.0–52.0)
Hemoglobin: 12.6 g/dL — ABNORMAL LOW (ref 13.0–17.0)
O2 Saturation: 98 %
PO2 ART: 110 mmHg — AB (ref 83.0–108.0)
POTASSIUM: 2.9 mmol/L — AB (ref 3.5–5.1)
Sodium: 113 mmol/L — CL (ref 135–145)
TCO2: 30 mmol/L (ref 22–32)
pCO2 arterial: 56.3 mmHg — ABNORMAL HIGH (ref 32.0–48.0)
pH, Arterial: 7.303 — ABNORMAL LOW (ref 7.350–7.450)

## 2017-03-15 LAB — BLOOD GAS, ARTERIAL
ACID-BASE EXCESS: 2 mmol/L (ref 0.0–2.0)
ACID-BASE EXCESS: 2.2 mmol/L — AB (ref 0.0–2.0)
BICARBONATE: 27.1 mmol/L (ref 20.0–28.0)
Bicarbonate: 24.8 mmol/L (ref 20.0–28.0)
DELIVERY SYSTEMS: POSITIVE
DRAWN BY: 232811
Delivery systems: POSITIVE
Drawn by: 270211
EXPIRATORY PAP: 5
Expiratory PAP: 5
FIO2: 0.4
FIO2: 40
Inspiratory PAP: 10
Inspiratory PAP: 10
Mode: POSITIVE
O2 SAT: 96.2 %
O2 Saturation: 96 %
PATIENT TEMPERATURE: 37
PCO2 ART: 46.8 mmHg (ref 32.0–48.0)
PH ART: 7.38 (ref 7.350–7.450)
Patient temperature: 97.6
pCO2 arterial: 31.7 mmHg — ABNORMAL LOW (ref 32.0–48.0)
pH, Arterial: 7.503 — ABNORMAL HIGH (ref 7.350–7.450)
pO2, Arterial: 92.2 mmHg (ref 83.0–108.0)
pO2, Arterial: 75.3 mmHg — ABNORMAL LOW (ref 83.0–108.0)

## 2017-03-15 LAB — BASIC METABOLIC PANEL
ANION GAP: 12 (ref 5–15)
Anion gap: 13 (ref 5–15)
BUN: 7 mg/dL (ref 6–20)
BUN: 7 mg/dL (ref 6–20)
CHLORIDE: 78 mmol/L — AB (ref 101–111)
CO2: 24 mmol/L (ref 22–32)
CO2: 24 mmol/L (ref 22–32)
Calcium: 9.2 mg/dL (ref 8.9–10.3)
Calcium: 9.2 mg/dL (ref 8.9–10.3)
Chloride: 79 mmol/L — ABNORMAL LOW (ref 101–111)
Creatinine, Ser: 0.54 mg/dL — ABNORMAL LOW (ref 0.61–1.24)
Creatinine, Ser: 0.5 mg/dL — ABNORMAL LOW (ref 0.61–1.24)
GFR calc Af Amer: >60 mL/min (ref >60)
GFR calc Af Amer: >60 mL/min (ref >60)
GLUCOSE: 129 mg/dL — AB (ref 65–99)
GLUCOSE: 120 mg/dL — AB (ref 65–99)
POTASSIUM: 2.8 mmol/L — AB (ref 3.5–5.1)
Potassium: 3.1 mmol/L — ABNORMAL LOW (ref 3.5–5.1)
Sodium: 115 mmol/L — CL (ref 135–145)
Sodium: 115 mmol/L — CL (ref 135–145)

## 2017-03-15 LAB — URINALYSIS, ROUTINE W REFLEX MICROSCOPIC
Bilirubin Urine: NEGATIVE
GLUCOSE, UA: NEGATIVE mg/dL
HGB URINE DIPSTICK: NEGATIVE
Ketones, ur: NEGATIVE mg/dL
NITRITE: NEGATIVE
PROTEIN: NEGATIVE mg/dL
Specific Gravity, Urine: 1.017 (ref 1.005–1.030)
pH: 6 (ref 5.0–8.0)

## 2017-03-15 LAB — OSMOLALITY, URINE: Osmolality, Ur: 316 mOsm/kg (ref 300–900)

## 2017-03-15 LAB — SODIUM, URINE, RANDOM: Sodium, Ur: 13 mmol/L

## 2017-03-15 LAB — OSMOLALITY: OSMOLALITY: 241 mosm/kg — AB (ref 275–295)

## 2017-03-15 LAB — ABO/RH: ABO/RH(D): A POS

## 2017-03-15 SURGERY — ARTHROPLASTY, HIP, TOTAL, ANTERIOR APPROACH
Anesthesia: Spinal | Site: Hip | Laterality: Right

## 2017-03-15 MED ORDER — ORAL CARE MOUTH RINSE
15.0000 mL | Freq: Two times a day (BID) | OROMUCOSAL | Status: DC
  Administered 2017-03-16 – 2017-03-28 (×18): 15 mL via OROMUCOSAL

## 2017-03-15 MED ORDER — BUPIVACAINE-EPINEPHRINE 0.25% -1:200000 IJ SOLN
INTRAMUSCULAR | Status: AC
  Filled 2017-03-15: qty 1

## 2017-03-15 MED ORDER — OXYCODONE HCL 5 MG PO TABS: 10.0000 mg | ORAL_TABLET | ORAL | Status: DC | PRN

## 2017-03-15 MED ORDER — BISACODYL 10 MG RE SUPP: 10.0000 mg | Freq: Every day | RECTAL | Status: DC | PRN

## 2017-03-15 MED ORDER — METOCLOPRAMIDE HCL 5 MG/ML IJ SOLN: 5.0000 mg | Freq: Three times a day (TID) | INTRAMUSCULAR | Status: DC | PRN

## 2017-03-15 MED ORDER — PSEUDOEPHEDRINE HCL 30 MG PO TABS
30.0000 mg | ORAL_TABLET | Freq: Every day | ORAL | Status: DC
  Administered 2017-03-16 – 2017-03-29 (×12): 30 mg via ORAL
  Filled 2017-03-15 (×14): qty 1

## 2017-03-15 MED ORDER — ACETAMINOPHEN 650 MG RE SUPP: 650.0000 mg | RECTAL | Status: DC | PRN

## 2017-03-15 MED ORDER — PROPOFOL 500 MG/50ML IV EMUL
INTRAVENOUS | Status: DC | PRN
  Administered 2017-03-15: 75 ug/kg/min via INTRAVENOUS

## 2017-03-15 MED ORDER — POLYETHYLENE GLYCOL 3350 17 G PO PACK: 17.0000 g | PACK | Freq: Every day | ORAL | Status: DC | PRN

## 2017-03-15 MED ORDER — OXYCODONE HCL 5 MG PO TABS: 5.0000 mg | ORAL_TABLET | ORAL | Status: DC | PRN

## 2017-03-15 MED ORDER — DEXMEDETOMIDINE HCL IN NACL 200 MCG/50ML IV SOLN
0.4000 ug/kg/h | INTRAVENOUS | Status: DC
  Administered 2017-03-15: 0.7 ug/kg/h via INTRAVENOUS
  Administered 2017-03-15: 0.2 ug/kg/h via INTRAVENOUS
  Filled 2017-03-15 (×2): qty 50

## 2017-03-15 MED ORDER — MENTHOL 3 MG MT LOZG
1.0000 | LOZENGE | OROMUCOSAL | Status: DC | PRN
Start: 2017-03-15 — End: 2017-03-29
  Filled 2017-03-15: qty 9

## 2017-03-15 MED ORDER — SODIUM CHLORIDE 0.9 % IV SOLN: INTRAVENOUS | Status: DC

## 2017-03-15 MED ORDER — DEXMEDETOMIDINE HCL IN NACL 200 MCG/50ML IV SOLN
INTRAVENOUS | Status: AC
  Filled 2017-03-15: qty 50

## 2017-03-15 MED ORDER — FENTANYL CITRATE (PF) 100 MCG/2ML IJ SOLN
INTRAMUSCULAR | Status: AC
  Filled 2017-03-15: qty 2

## 2017-03-15 MED ORDER — DIPHENHYDRAMINE HCL 12.5 MG/5ML PO ELIX: 12.5000 mg | ORAL_SOLUTION | ORAL | Status: DC | PRN

## 2017-03-15 MED ORDER — POTASSIUM CHLORIDE IN NACL 40-0.9 MEQ/L-% IV SOLN
INTRAVENOUS | Status: DC
  Administered 2017-03-15 – 2017-03-16 (×2): 100 mL/h via INTRAVENOUS
  Administered 2017-03-17: 50 mL/h via INTRAVENOUS
  Filled 2017-03-15 (×11): qty 1000

## 2017-03-15 MED ORDER — CHLORHEXIDINE GLUCONATE 0.12 % MT SOLN
15.0000 mL | Freq: Two times a day (BID) | OROMUCOSAL | Status: DC
  Administered 2017-03-15 – 2017-03-29 (×22): 15 mL via OROMUCOSAL
  Filled 2017-03-15 (×23): qty 15

## 2017-03-15 MED ORDER — MEPERIDINE HCL 50 MG/ML IJ SOLN: 6.2500 mg | INTRAMUSCULAR | Status: DC | PRN

## 2017-03-15 MED ORDER — MORPHINE SULFATE (PF) 4 MG/ML IV SOLN
1.0000 mg | INTRAVENOUS | Status: DC | PRN
  Administered 2017-03-15: 1 mg via INTRAVENOUS
  Filled 2017-03-15: qty 1

## 2017-03-15 MED ORDER — METOCLOPRAMIDE HCL 5 MG PO TABS: 5.0000 mg | ORAL_TABLET | Freq: Three times a day (TID) | ORAL | Status: DC | PRN

## 2017-03-15 MED ORDER — DEXAMETHASONE SODIUM PHOSPHATE 10 MG/ML IJ SOLN
10.0000 mg | Freq: Once | INTRAMUSCULAR | Status: AC
Start: 2017-03-15 — End: 2017-03-15
  Administered 2017-03-15: 10 mg via INTRAVENOUS

## 2017-03-15 MED ORDER — DOCUSATE SODIUM 100 MG PO CAPS
100.0000 mg | ORAL_CAPSULE | Freq: Two times a day (BID) | ORAL | Status: DC
  Administered 2017-03-16 – 2017-03-29 (×18): 100 mg via ORAL
  Filled 2017-03-15 (×24): qty 1

## 2017-03-15 MED ORDER — TRANEXAMIC ACID 1000 MG/10ML IV SOLN
1000.0000 mg | Freq: Once | INTRAVENOUS | Status: AC
  Administered 2017-03-15: 1000 mg via INTRAVENOUS
  Filled 2017-03-15: qty 1100

## 2017-03-15 MED ORDER — AMLODIPINE BESYLATE 5 MG PO TABS
5.0000 mg | ORAL_TABLET | Freq: Once | ORAL | Status: DC
  Filled 2017-03-15: qty 1

## 2017-03-15 MED ORDER — DEXMEDETOMIDINE HCL IN NACL 200 MCG/50ML IV SOLN
0.2000 ug/kg/h | INTRAVENOUS | Status: DC
  Administered 2017-03-15: 0.2 ug/kg/h via INTRAVENOUS

## 2017-03-15 MED ORDER — LACTATED RINGERS IV BOLUS (SEPSIS)
300.0000 mL | Freq: Once | INTRAVENOUS | Status: AC
  Administered 2017-03-15: 300 mL via INTRAVENOUS

## 2017-03-15 MED ORDER — PROPOFOL 10 MG/ML IV BOLUS
INTRAVENOUS | Status: AC
  Filled 2017-03-15: qty 60

## 2017-03-15 MED ORDER — 0.9 % SODIUM CHLORIDE (POUR BTL) OPTIME
TOPICAL | Status: DC | PRN
  Administered 2017-03-15: 1000 mL

## 2017-03-15 MED ORDER — PHENOL 1.4 % MT LIQD: 1.0000 | OROMUCOSAL | Status: DC | PRN

## 2017-03-15 MED ORDER — ACETAMINOPHEN 10 MG/ML IV SOLN
1000.0000 mg | Freq: Once | INTRAVENOUS | Status: AC
  Administered 2017-03-15: 1000 mg via INTRAVENOUS

## 2017-03-15 MED ORDER — HYDROCHLOROTHIAZIDE 25 MG PO TABS: 25.0000 mg | ORAL_TABLET | Freq: Every day | ORAL | Status: DC

## 2017-03-15 MED ORDER — DEXAMETHASONE SODIUM PHOSPHATE 10 MG/ML IJ SOLN
10.0000 mg | Freq: Once | INTRAMUSCULAR | Status: AC
  Administered 2017-03-16: 10 mg via INTRAVENOUS
  Filled 2017-03-15: qty 1

## 2017-03-15 MED ORDER — BUPIVACAINE-EPINEPHRINE (PF) 0.25% -1:200000 IJ SOLN
INTRAMUSCULAR | Status: DC | PRN
  Administered 2017-03-15: 30 mL

## 2017-03-15 MED ORDER — HYDROMORPHONE HCL 1 MG/ML IJ SOLN
0.5000 mg | INTRAMUSCULAR | Status: AC | PRN
  Administered 2017-03-15 – 2017-03-16 (×5): 1 mg via INTRAVENOUS
  Administered 2017-03-16: 0.5 mg via INTRAVENOUS
  Filled 2017-03-15 (×6): qty 1

## 2017-03-15 MED ORDER — DEXTROSE 5 % IV SOLN
500.0000 mg | Freq: Four times a day (QID) | INTRAVENOUS | Status: DC | PRN
  Administered 2017-03-15: 500 mg via INTRAVENOUS
  Filled 2017-03-15: qty 550

## 2017-03-15 MED ORDER — RIVAROXABAN 10 MG PO TABS
10.0000 mg | ORAL_TABLET | Freq: Every day | ORAL | Status: DC
  Administered 2017-03-16 – 2017-03-27 (×11): 10 mg via ORAL
  Filled 2017-03-15 (×12): qty 1

## 2017-03-15 MED ORDER — METHOCARBAMOL 500 MG PO TABS
500.0000 mg | ORAL_TABLET | Freq: Four times a day (QID) | ORAL | Status: DC | PRN
Start: 2017-03-15 — End: 2017-03-15

## 2017-03-15 MED ORDER — ACETAMINOPHEN 500 MG PO TABS
1000.0000 mg | ORAL_TABLET | Freq: Four times a day (QID) | ORAL | Status: AC
  Administered 2017-03-16: 1000 mg via ORAL
  Filled 2017-03-15: qty 2

## 2017-03-15 MED ORDER — PROPOFOL 10 MG/ML IV BOLUS
INTRAVENOUS | Status: AC
Start: 2017-03-15 — End: 2017-03-15
  Filled 2017-03-15: qty 20

## 2017-03-15 MED ORDER — POTASSIUM CHLORIDE 10 MEQ/100ML IV SOLN
10.0000 meq | INTRAVENOUS | Status: AC
  Administered 2017-03-15 – 2017-03-16 (×5): 10 meq via INTRAVENOUS
  Filled 2017-03-15 (×2): qty 100

## 2017-03-15 MED ORDER — STERILE WATER FOR IRRIGATION IR SOLN
Status: DC | PRN
  Administered 2017-03-15: 2000 mL

## 2017-03-15 MED ORDER — FAMOTIDINE 20 MG PO TABS: 20.0000 mg | ORAL_TABLET | Freq: Two times a day (BID) | ORAL | Status: DC

## 2017-03-15 MED ORDER — VANCOMYCIN HCL IN DEXTROSE 1-5 GM/200ML-% IV SOLN
1000.0000 mg | Freq: Two times a day (BID) | INTRAVENOUS | Status: AC
  Filled 2017-03-15: qty 200

## 2017-03-15 MED ORDER — ACETAMINOPHEN 325 MG PO TABS
650.0000 mg | ORAL_TABLET | ORAL | Status: DC | PRN
  Administered 2017-03-17 – 2017-03-18 (×3): 650 mg via ORAL
  Filled 2017-03-15 (×3): qty 2

## 2017-03-15 MED ORDER — LACTATED RINGERS IV SOLN: INTRAVENOUS | Status: DC

## 2017-03-15 MED ORDER — ACETAMINOPHEN 10 MG/ML IV SOLN
INTRAVENOUS | Status: AC
  Filled 2017-03-15: qty 100

## 2017-03-15 MED ORDER — DEXMEDETOMIDINE HCL IN NACL 400 MCG/100ML IV SOLN
0.4000 ug/kg/h | INTRAVENOUS | Status: DC
  Administered 2017-03-15: 1.2 ug/kg/h via INTRAVENOUS
  Administered 2017-03-16 – 2017-03-17 (×4): 0.7 ug/kg/h via INTRAVENOUS
  Filled 2017-03-15 (×5): qty 100

## 2017-03-15 MED ORDER — CHLORHEXIDINE GLUCONATE 4 % EX LIQD: 60.0000 mL | Freq: Once | CUTANEOUS | Status: DC

## 2017-03-15 MED ORDER — METOCLOPRAMIDE HCL 5 MG/ML IJ SOLN: 10.0000 mg | Freq: Once | INTRAMUSCULAR | Status: DC | PRN

## 2017-03-15 MED ORDER — FLEET ENEMA 7-19 GM/118ML RE ENEM: 1.0000 | ENEMA | Freq: Once | RECTAL | Status: DC | PRN

## 2017-03-15 MED ORDER — VANCOMYCIN HCL IN DEXTROSE 1-5 GM/200ML-% IV SOLN
1000.0000 mg | INTRAVENOUS | Status: AC
  Administered 2017-03-15: 1000 mg via INTRAVENOUS

## 2017-03-15 MED ORDER — ONDANSETRON HCL 4 MG PO TABS: 4.0000 mg | ORAL_TABLET | Freq: Four times a day (QID) | ORAL | Status: DC | PRN

## 2017-03-15 MED ORDER — LACTATED RINGERS IV SOLN
INTRAVENOUS | Status: DC
  Administered 2017-03-15: 1000 mL via INTRAVENOUS
  Administered 2017-03-15 (×2): via INTRAVENOUS

## 2017-03-15 MED ORDER — ONDANSETRON HCL 4 MG/2ML IJ SOLN: 4.0000 mg | Freq: Four times a day (QID) | INTRAMUSCULAR | Status: DC | PRN

## 2017-03-15 MED ORDER — VANCOMYCIN HCL IN DEXTROSE 1-5 GM/200ML-% IV SOLN
INTRAVENOUS | Status: AC
  Filled 2017-03-15: qty 200

## 2017-03-15 MED ORDER — HYDROMORPHONE HCL 1 MG/ML IJ SOLN: 0.2500 mg | INTRAMUSCULAR | Status: DC | PRN

## 2017-03-15 MED ORDER — FENTANYL CITRATE (PF) 100 MCG/2ML IJ SOLN
50.0000 ug | INTRAMUSCULAR | Status: AC | PRN
  Administered 2017-03-15: 100 ug via INTRAVENOUS
  Administered 2017-03-15 (×2): 50 ug via INTRAVENOUS
  Filled 2017-03-15: qty 2

## 2017-03-15 MED ORDER — PHENYLEPHRINE HCL 10 MG/ML IJ SOLN
INTRAMUSCULAR | Status: DC | PRN
  Administered 2017-03-15: 120 ug via INTRAVENOUS

## 2017-03-15 SURGICAL SUPPLY — 39 items
BLADE SAG 18X100X1.27 (BLADE) ×3 IMPLANT
CAPT HIP TOTAL 2 ×3 IMPLANT
CLOSURE WOUND 1/2 X4 (GAUZE/BANDAGES/DRESSINGS) ×1
CLOTH BEACON ORANGE TIMEOUT ST (SAFETY) ×3 IMPLANT
COVER PERINEAL POST (MISCELLANEOUS) ×3 IMPLANT
COVER SURGICAL LIGHT HANDLE (MISCELLANEOUS) ×3 IMPLANT
DECANTER SPIKE VIAL GLASS SM (MISCELLANEOUS) ×3 IMPLANT
DRAPE STERI IOBAN 125X83 (DRAPES) ×3 IMPLANT
DRAPE U-SHAPE 47X51 STRL (DRAPES) ×6 IMPLANT
DRSG ADAPTIC 3X8 NADH LF (GAUZE/BANDAGES/DRESSINGS) ×3 IMPLANT
DRSG MEPILEX BORDER 4X4 (GAUZE/BANDAGES/DRESSINGS) ×3 IMPLANT
DRSG MEPILEX BORDER 4X8 (GAUZE/BANDAGES/DRESSINGS) ×3 IMPLANT
DURAPREP 26ML APPLICATOR (WOUND CARE) ×3 IMPLANT
ELECT REM PT RETURN 15FT ADLT (MISCELLANEOUS) ×3 IMPLANT
EVACUATOR 1/8 PVC DRAIN (DRAIN) ×3 IMPLANT
GLOVE BIO SURGEON STRL SZ7.5 (GLOVE) ×3 IMPLANT
GLOVE BIO SURGEON STRL SZ8 (GLOVE) ×6 IMPLANT
GLOVE BIOGEL PI IND STRL 6.5 (GLOVE) ×1 IMPLANT
GLOVE BIOGEL PI IND STRL 7.5 (GLOVE) ×3 IMPLANT
GLOVE BIOGEL PI IND STRL 8 (GLOVE) ×2 IMPLANT
GLOVE BIOGEL PI INDICATOR 6.5 (GLOVE) ×2
GLOVE BIOGEL PI INDICATOR 7.5 (GLOVE) ×6
GLOVE BIOGEL PI INDICATOR 8 (GLOVE) ×4
GLOVE ECLIPSE 7.5 STRL STRAW (GLOVE) ×3 IMPLANT
GLOVE SURG SS PI 6.5 STRL IVOR (GLOVE) ×3 IMPLANT
GLOVE SURG SS PI 8.0 STRL IVOR (GLOVE) ×3 IMPLANT
GOWN STRL REUS W/ TWL XL LVL3 (GOWN DISPOSABLE) ×2 IMPLANT
GOWN STRL REUS W/TWL LRG LVL3 (GOWN DISPOSABLE) ×6 IMPLANT
GOWN STRL REUS W/TWL XL LVL3 (GOWN DISPOSABLE) ×4
PACK ANTERIOR HIP CUSTOM (KITS) ×3 IMPLANT
STRIP CLOSURE SKIN 1/2X4 (GAUZE/BANDAGES/DRESSINGS) ×2 IMPLANT
SUT ETHIBOND NAB CT1 #1 30IN (SUTURE) ×3 IMPLANT
SUT MNCRL AB 4-0 PS2 18 (SUTURE) ×3 IMPLANT
SUT STRATAFIX 0 PDS 27 VIOLET (SUTURE) ×3
SUT VIC AB 2-0 CT1 27 (SUTURE) ×4
SUT VIC AB 2-0 CT1 TAPERPNT 27 (SUTURE) ×2 IMPLANT
SUTURE STRATFX 0 PDS 27 VIOLET (SUTURE) ×1 IMPLANT
TRAY FOLEY W/METER SILVER 16FR (SET/KITS/TRAYS/PACK) ×3 IMPLANT
YANKAUER SUCT BULB TIP 10FT TU (MISCELLANEOUS) ×3 IMPLANT

## 2017-03-15 NOTE — Anesthesia Procedure Notes (Signed)
Spinal  Start time: 03/15/2017 12:06 PM End time: 03/15/2017 12:12 PM Staffing Resident/CRNA: Kizzie Fantasiaarver, Yesenia Locurto J, CRNA Performed: resident/CRNA  Preanesthetic Checklist Completed: patient identified, site marked, surgical consent, pre-op evaluation, timeout performed, IV checked, risks and benefits discussed and monitors and equipment checked Spinal Block Patient position: sitting Prep: Betadine Patient monitoring: heart rate, continuous pulse ox and blood pressure Approach: left paramedian Location: L3-4 Injection technique: single-shot Needle Needle type: Quincke  Needle gauge: 22 G Needle length: 9 cm Needle insertion depth: 7 cm Additional Notes Pt sitting position, sterile prep and drape negative paresthesia/heme

## 2017-03-15 NOTE — Progress Notes (Signed)
Pt seen, more alert, answering questions appropriately, requesting bipap mask off.  Bipap mask removed, and pt placed on 2lnc.  HR73, rr14, spo2 97%.  RN aware.  Bipap remains in room on standby.  RT will continue to monitor and assess pt as needed.

## 2017-03-15 NOTE — Progress Notes (Signed)
CRITICAL VALUE ALERT  Critical Value: Na 115   Date & Time Notied:  03/15/17 and 1820   Provider Notified: MD Franchot Erichsenosenblatt at [phone]  Orders Received/Actions taken: Will continue to monitor and assess. Notified Josephina ShihShanda Graham as well.

## 2017-03-15 NOTE — Progress Notes (Addendum)
Pt has been very restless with c/o 10/10 pain. Spoke with on call provider for Ortho Sx, Avel Peacerew Perkins P.A. Given verbal orders for Dilaudid 0.5 mg-1 mg inj., q1, PRN.   - Pt has Critical Sodium Level- Na @ 115. Notified on call at E-link @ 2100. No orders given at this time.

## 2017-03-15 NOTE — Consult Note (Signed)
PULMONARY / CRITICAL CARE MEDICINE   Name: Randy Burgess MRN: [id number] DOB: 1945/08/16    ADMISSION DATE:  03/15/2017 CONSULTATION DATE:  03/15/17  REFERRING MD:  Hospitlaist  CHIEF COMPLAINT: Altered mental status, hyponatremia, Acute respiratory acidosis.  HISTORY OF PRESENT ILLNESS:   Patient underwent right hip arthropalasty elective today. He apparently was bit obtunded post-op and was found to have an acute respiratory acidosis. He was placed on BiPAP. Admission  to the ICU was requested by anaesthesia. In addition, the patient was found to have a sodium of 113 post-op. I do not see recent record of his electrlytes pre-admission. The patient remains obtunded and poorly responsive presently   PAST MEDICAL HISTORY :  He  has a past medical history of DDD (degenerative disc disease), lumbar, Essential hypertension, Frequent headaches, and Osteoarthritis of both hips.  PAST SURGICAL HISTORY: He  has a past surgical history that includes Wisdom tooth extraction (1980) and Cataract extraction, bilateral.  Allergies  Allergen Reactions  . Penicillins     Childhood allergy Has patient had a PCN reaction causing immediate rash, facial/tongue/throat swelling, SOB or lightheadedness with hypotension: Unknown Has patient had a PCN reaction causing severe rash involving mucus membranes or skin necrosis: Unknown Has patient had a PCN reaction that required hospitalization: Unknown Has patient had a PCN reaction occurring within the last 10 years: No If all of the above answers are "NO", then may proceed with Cephalosporin use.   . Sulfa Antibiotics     Unknown reactions     No current facility-administered medications on file prior to encounter.    Current Outpatient Medications on File Prior to Encounter  Medication Sig  . Carboxymethylcellul-Glycerin (LUBRICATING EYE DROPS OP) Apply 1 drop to eye as needed (dry eyes).  . Melatonin 5 MG TABS Take 5-10 mg by mouth at bedtime as  needed (sleep).  . ranitidine (ZANTAC) 150 MG tablet Take 150 mg by mouth 2 (two) times daily as needed for heartburn.  . Simethicone (ANTI GAS PO) Take 1-2 capsules by mouth daily as needed (gas).    FAMILY HISTORY:  His indicated that his mother is deceased. He indicated that his father is deceased. He indicated that his daughter is deceased.   SOCIAL HISTORY: He  reports that he has been smoking cigarettes.  He has a 55.00 pack-year smoking history. he has never used smokeless tobacco. He reports that he does not drink alcohol or use drugs.  REVIEW OF SYSTEMS:   The patient is unable to give ROS secondary to his encephalopathy.  SUBJECTIVE:  No obvious complaints presently  VITAL SIGNS: BP (!) 109/59   Pulse 60   Temp (!) 96.9 F (36.1 C)   Resp 12   Ht 5\' 11"  (1.803 m) Comment: Simultaneous filing. User may not have seen previous data.  Wt 179 lb (81.2 kg) Comment: Simultaneous filing. User may not have seen previous data.  SpO2 95%   BMI 24.97 kg/m   HEMODYNAMICS:    VENTILATOR SETTINGS: FiO2 (%):  [40 %] 40 %  INTAKE / OUTPUT: I/O last 3 completed shifts: In: 1258.9 [I.V.:1036.4; IV Piggyback:222.5] Out: 1190 [Urine:810; Drains:80; Blood:300]  PHYSICAL EXAMINATION: General: Patient is obtunded but arouses to stimuli. He is on BIPAP. The patient is mnoderately kyphotic Neuro:  He appears capable of moving all extremities HEENT: [place]/AT Cardiovascular:  RRRs1S2 Lungs:  Bilateral distantt BS Abdomen:  Soft, BS, non-tender Musculoskeletal: No gorss swelling of extremities Skin: Intact  LABS:  BMET Recent Labs  Lab  03/15/17 1434 03/15/17 1736  NA 113* 115*  K 2.9* 2.8*  CL  --  79*  CO2  --  24  BUN  --  7  CREATININE  --  0.54*  GLUCOSE  --  129*    Electrolytes Recent Labs  Lab 03/15/17 1736  CALCIUM 9.2    CBC Recent Labs  Lab 03/15/17 1434  HGB 12.6*  HCT 37.0*    Coag's No results for input(s): APTT, INR in the last 168  hours.  Sepsis Markers No results for input(s): LATICACIDVEN, PROCALCITON, O2SATVEN in the last 168 hours.  ABG Recent Labs  Lab 03/15/17 1434 03/15/17 1536  PHART 7.303* 7.380  PCO2ART 56.3* 46.8  PO2ART 110.0* 92.2    Liver Enzymes No results for input(s): AST, ALT, ALKPHOS, BILITOT, ALBUMIN in the last 168 hours.  Cardiac Enzymes No results for input(s): TROPONINI, PROBNP in the last 168 hours.  Glucose No results for input(s): GLUCAP in the last 168 hours.  Imaging X-ray Chest Pa Or Ap  Result Date: 03/15/2017 CLINICAL DATA:  Shortness of breath. EXAM: CHEST 1 VIEW COMPARISON:  None. FINDINGS: The patient is slightly rotated to the left. Normal heart size. Accentuation of the right paratracheal stripe is likely due to rotation. Elevation of the left hemidiaphragm. Bibasilar atelectasis. No focal consolidation, pleural effusion, or pneumothorax. No acute osseous abnormality. IMPRESSION: 1. Accentuation of the right paratracheal stripe is likely due to patient rotation. Recommend dedicated PA and lateral views to exclude underlying adenopathy/mass. 2. Bibasilar atelectasis. Electronically Signed   By: Obie Dredge M.D.   On: 03/15/2017 14:43   Dg Pelvis Portable  Result Date: 03/15/2017 CLINICAL DATA:  Right hip replacement. EXAM: PORTABLE PELVIS 1-2 VIEWS COMPARISON:  None. FINDINGS: AP view of the pelvis demonstrates right hip arthroplasty, without acute hardware complication. Severe left hip osteoarthritis including joint space narrowing and subchondral sclerosis. IMPRESSION: Expected appearance after right hip arthroplasty. Electronically Signed   By: Jeronimo Greaves M.D.   On: 03/15/2017 14:43   Dg C-arm 1-60 Min-no Report  Result Date: 03/15/2017 Fluoroscopy was utilized by the requesting physician.  No radiographic interpretation.     ASSESSMENT / PLAN: Encephalopathy The patient developed encephalopathy post-op. He appears to have COPD. ? restrictive lung disease  and perhaps OSA as well. ABG did improve on BIPAP and the patient remains on BIPAP. I see no gross evidence of pneumonia on his CXR He had spinal anaesthesia for his surgery. His sodium is only 113. Unclear why is so hyponatremic and we do not have previous electrolyte results. We are performing serial sodium checks. The sodium came up a tad with NaCL. Urine sodium  And serum osmolarity pending.  Acute Respiratory Failure I t does not appear that thepatient received sedation intraoperatively. Nevertheeess,he did develop an acute respiratory acidosis. It apear s to be responding to BIPAP. Will recheck ABG once again shortly and monittor neuro staus in the ICU.  Hyponatremia Unclear cause. W/U in progress. Patient appears to be responding to nacL presently. Does patient have SIADH? Frequent sodium checks ordered.          Leslie Andrea MD Pulmonary and Critical Care Medicine [facility] Pager: [phone]  03/15/2017, 7:13 PM

## 2017-03-15 NOTE — Transfer of Care (Signed)
Immediate Anesthesia Transfer of Care Note  Patient: Randy Burgess  Procedure(s) Performed: RIGHT TOTAL HIP ARTHROPLASTY ANTERIOR APPROACH (Right Hip)  Patient Location: PACU  Anesthesia Type:Spinal  Level of Consciousness: sedated  Airway & Oxygen Therapy: Patient Spontanous Breathing and Patient connected to face mask oxygen  Post-op Assessment: Report given to RN and Post -op Vital signs reviewed and stable  Post vital signs: Reviewed and stable  Last Vitals:  Vitals:   03/15/17 1014  BP: (!) 143/71  Pulse: 81  Resp: (!) 22  SpO2: 98%    Last Pain:  Vitals:   03/15/17 1120  TempSrc:   PainSc: 4       Patients Stated Pain Goal: 4 (03/15/17 1120)  Complications: No apparent anesthesia complications

## 2017-03-15 NOTE — Op Note (Signed)
OPERATIVE REPORT- TOTAL HIP ARTHROPLASTY   PREOPERATIVE DIAGNOSIS: Osteoarthritis of the Right hip.   POSTOPERATIVE DIAGNOSIS: Osteoarthritis of the Right  hip.   PROCEDURE: Right total hip arthroplasty, anterior approach.   SURGEON: Ollen GrossFrank Tametha Banning, MD   ASSISTANT: Avel Peacerew Perkins, PA-C  ANESTHESIA:  Spinal  ESTIMATED BLOOD LOSS:- 250 ml  DRAINS: Hemovac x1.   COMPLICATIONS: None   CONDITION: PACU - hemodynamically stable.   BRIEF CLINICAL NOTE: Randy Burgess is a 72 y.o. male who has advanced end-  stage arthritis of their Right  hip with progressively worsening pain and  dysfunction.The patient has failed nonoperative management and presents for  total hip arthroplasty.   PROCEDURE IN DETAIL: After successful administration of spinal  anesthetic, the traction boots for the [facility] bed were placed on both  feet and the patient was placed onto the [facility] bed, boots placed into the leg  holders. The Right hip was then isolated from the perineum with plastic  drapes and prepped and draped in the usual sterile fashion. ASIS and  greater trochanter were marked and a oblique incision was made, starting  at about 1 cm lateral and 2 cm distal to the ASIS and coursing towards  the anterior cortex of the femur. The skin was cut with a 10 blade  through subcutaneous tissue to the level of the fascia overlying the  tensor fascia lata muscle. The fascia was then incised in line with the  incision at the junction of the anterior third and posterior 2/3rd. The  muscle was teased off the fascia and then the interval between the TFL  and the rectus was developed. The Hohmann retractor was then placed at  the top of the femoral neck over the capsule. The vessels overlying the  capsule were cauterized and the fat on top of the capsule was removed.  A Hohmann retractor was then placed anterior underneath the rectus  femoris to give exposure to the entire anterior capsule. A T-shaped   capsulotomy was performed. The edges were tagged and the femoral head  was identified.       Osteophytes are removed off the superior acetabulum.  The femoral neck was then cut in situ with an oscillating saw. Traction  was then applied to the left lower extremity utilizing the [facility]  traction. The femoral head was then removed. Retractors were placed  around the acetabulum and then circumferential removal of the labrum was  performed. Osteophytes were also removed. Reaming starts at 49 mm to  medialize and  Increased in 2 mm increments to 55 mm. We reamed in  approximately 40 degrees of abduction, 20 degrees anteversion. A 56 mm  pinnacle acetabular shell was then impacted in anatomic position under  fluoroscopic guidance with excellent purchase. We did not need to place  any additional dome screws. A 36 mm neutral + 4 marathon liner was then  placed into the acetabular shell.       The femoral lift was then placed along the lateral aspect of the femur  just distal to the vastus ridge. The leg was  externally rotated and capsule  was stripped off the inferior aspect of the femoral neck down to the  level of the lesser trochanter, this was done with electrocautery. The femur was lifted after this was performed. The  leg was then placed in an extended and adducted position essentially delivering the femur. We also removed the capsule superiorly and the piriformis from the piriformis fossa to  gain excellent exposure of the  proximal femur. Rongeur was used to remove some cancellous bone to get  into the lateral portion of the proximal femur for placement of the  initial starter reamer. The starter broaches was placed  the starter broach  and was shown to go down the center of the canal. Broaching  with the  Corail system was then performed starting at size 8, coursing  Up to size 16. A size 16 had excellent torsional and rotational  and axial stability. The trial standard offset neck was then  placed  with a 36 + 5 trial head. The hip was then reduced. We confirmed that  the stem was in the canal both on AP and lateral x-rays. It also has excellent sizing. The hip was reduced with outstanding stability through full extension and full external rotation.. AP pelvis was taken and the leg lengths were measured and found to be equal. Hip was then dislocated again and the femoral head and neck removed. The  femoral broach was removed. Size 16 Corail stem with a standard offset  neck was then impacted into the femur following native anteversion. Has  excellent purchase in the canal. Excellent torsional and rotational and  axial stability. It is confirmed to be in the canal on AP and lateral  fluoroscopic views. The 36 + 5 ceramic head was placed and the hip  reduced with outstanding stability. Again AP pelvis was taken and it  confirmed that the leg lengths were equal. The wound was then copiously  irrigated with saline solution and the capsule reattached and repaired  with Ethibond suture. 30 ml of .25% Bupivicaine was  injected into the capsule and into the edge of the tensor fascia lata as well as subcutaneous tissue. The fascia overlying the tensor fascia lata was then closed with a running #1 V-Loc. Subcu was closed with interrupted 2-0 Vicryl and subcuticular running 4-0 Monocryl. Incision was cleaned  and dried. Steri-Strips and a bulky sterile dressing applied. Hemovac  drain was hooked to suction and then the patient was awakened and transported to  recovery in stable condition.        Please note that a surgical assistant was a medical necessity for this procedure to perform it in a safe and expeditious manner. Assistant was necessary to provide appropriate retraction of vital neurovascular structures and to prevent femoral fracture and allow for anatomic placement of the prosthesis.  Ollen Gross, M.D.

## 2017-03-15 NOTE — Interval H&P Note (Signed)
History and Physical Interval Note:  03/15/2017 10:04 AM  Randy Burgess  has presented today for surgery, with the diagnosis of Right hip osteoarthritis  The various methods of treatment have been discussed with the patient and family. After consideration of risks, benefits and other options for treatment, the patient has consented to  Procedure(s): RIGHT TOTAL HIP ARTHROPLASTY ANTERIOR APPROACH (Right) as a surgical intervention .  The patient's history has been reviewed, patient examined, no change in status, stable for surgery.  I have reviewed the patient's chart and labs.  Questions were answered to the patient's satisfaction.     [name]

## 2017-03-15 NOTE — Discharge Instructions (Signed)

## 2017-03-15 NOTE — Progress Notes (Signed)
CRITICAL VALUE ALERT  Critical Value: serum osmolality- 241  Date & Time Notied:03/15/17 @ 2300  Provider notified, no orders given at this time

## 2017-03-15 NOTE — Anesthesia Preprocedure Evaluation (Signed)
Anesthesia Evaluation  Patient identified by MRN, date of birth, ID band Patient awake    Reviewed: Allergy & Precautions, NPO status , Patient's Chart, lab work & pertinent test results  Airway Mallampati: III  TM Distance: >3 FB Neck ROM: Full    Dental  (+) Poor Dentition   Pulmonary Current Smoker,    + rhonchi        Cardiovascular hypertension, Pt. on medications Normal cardiovascular exam Rhythm:Regular Rate:Normal     Neuro/Psych  Headaches, negative psych ROS   GI/Hepatic negative GI ROS, Neg liver ROS,   Endo/Other  negative endocrine ROS  Renal/GU negative Renal ROS  negative genitourinary   Musculoskeletal  (+) Arthritis , Osteoarthritis,  OA Right hip   Abdominal   Peds  Hematology negative hematology ROS (+)   Anesthesia Other Findings   Reproductive/Obstetrics                             Anesthesia Physical Anesthesia Plan  ASA: III  Anesthesia Plan: Spinal   Post-op Pain Management:    Induction:   PONV Risk Score and Plan: 1 and Ondansetron, Treatment may vary due to age or medical condition and Propofol infusion  Airway Management Planned: Natural Airway, Simple Face Mask and Nasal Cannula  Additional Equipment:   Intra-op Plan:   Post-operative Plan:   Informed Consent: I have reviewed the patients History and Physical, chart, labs and discussed the procedure including the risks, benefits and alternatives for the proposed anesthesia with the patient or authorized representative who has indicated his/her understanding and acceptance.   Dental advisory given  Plan Discussed with: Anesthesiologist, CRNA and Surgeon  Anesthesia Plan Comments:         Anesthesia Quick Evaluation

## 2017-03-16 ENCOUNTER — Encounter (HOSPITAL_COMMUNITY): Payer: Self-pay | Admitting: Orthopedic Surgery

## 2017-03-16 LAB — CBC
HEMATOCRIT: 25.7 % — AB (ref 39.0–52.0)
Hemoglobin: 9.7 g/dL — ABNORMAL LOW (ref 13.0–17.0)
MCH: 31 pg (ref 26.0–34.0)
MCHC: 37.7 g/dL — AB (ref 30.0–36.0)
MCV: 82.1 fL (ref 78.0–100.0)
Platelets: 188 10*3/uL (ref 150–400)
RBC: 3.13 MIL/uL — ABNORMAL LOW (ref 4.22–5.81)
RDW: 11.7 % (ref 11.5–15.5)
WBC: 17.6 10*3/uL — ABNORMAL HIGH (ref 4.0–10.5)

## 2017-03-16 LAB — CREATININE, URINE, RANDOM: Creatinine, Urine: 153.27 mg/dL

## 2017-03-16 LAB — BASIC METABOLIC PANEL
Anion gap: 10 (ref 5–15)
BUN: 9 mg/dL (ref 6–20)
CALCIUM: 8.8 mg/dL — AB (ref 8.9–10.3)
CO2: 23 mmol/L (ref 22–32)
CREATININE: 0.59 mg/dL — AB (ref 0.61–1.24)
Chloride: 83 mmol/L — ABNORMAL LOW (ref 101–111)
GFR calc Af Amer: >60 mL/min (ref >60)
GFR calc non Af Amer: >60 mL/min (ref >60)
Glucose, Bld: 100 mg/dL — ABNORMAL HIGH (ref 65–99)
Potassium: 3.9 mmol/L (ref 3.5–5.1)
Sodium: 116 mmol/L — CL (ref 135–145)

## 2017-03-16 LAB — MRSA PCR SCREENING: MRSA by PCR: NEGATIVE

## 2017-03-16 LAB — URIC ACID: Uric Acid, Serum: 3.6 mg/dL — ABNORMAL LOW (ref 4.4–7.6)

## 2017-03-16 MED ORDER — SODIUM CHLORIDE 1 G PO TABS
1.0000 g | ORAL_TABLET | Freq: Three times a day (TID) | ORAL | Status: DC
  Administered 2017-03-16 – 2017-03-17 (×3): 1 g via ORAL
  Filled 2017-03-16 (×4): qty 1

## 2017-03-16 NOTE — Consult Note (Addendum)
PULMONARY / CRITICAL CARE MEDICINE   Name: Randy PickerelKenneth Burgess MRN: [id number] DOB: Jan 22, 1946    ADMISSION DATE:  03/15/2017 CONSULTATION DATE:  03/15/17  REFERRING MD:  Hospitlaist  CHIEF COMPLAINT: Altered mental status, hyponatremia, Acute respiratory acidosis.  HISTORY OF PRESENT ILLNESS:   Patient underwent right hip arthropalasty elective today. He apparently was bit obtunded post-op and was found to have an acute respiratory acidosis. He was placed on BiPAP. Admission  to the ICU was requested by anaesthesia. In addition, the patient was found to have a sodium of 113 post-op. I do not see recent record of his electrlytes pre-admission. The patient remains obtunded and poorly responsive presently.  The patient is more awake this AM. He is able to respond appropriately to out questions and commands. He did pull out a few IVs overnight. His sodium appears to bew slowly rising. He is manging off BIPAP presently    PAST MEDICAL HISTORY :  He  has a past medical history of DDD (degenerative disc disease), lumbar, Essential hypertension, Frequent headaches, and Osteoarthritis of both hips.  PAST SURGICAL HISTORY: He  has a past surgical history that includes Wisdom tooth extraction (1980) and Cataract extraction, bilateral.  Allergies  Allergen Reactions  . Penicillins     Childhood allergy Has patient had a PCN reaction causing immediate rash, facial/tongue/throat swelling, SOB or lightheadedness with hypotension: Unknown Has patient had a PCN reaction causing severe rash involving mucus membranes or skin necrosis: Unknown Has patient had a PCN reaction that required hospitalization: Unknown Has patient had a PCN reaction occurring within the last 10 years: No If all of the above answers are "NO", then may proceed with Cephalosporin use.   . Sulfa Antibiotics     Unknown reactions     No current facility-administered medications on file prior to encounter.    Current  Outpatient Medications on File Prior to Encounter  Medication Sig  . Carboxymethylcellul-Glycerin (LUBRICATING EYE DROPS OP) Apply 1 drop to eye as needed (dry eyes).  . Melatonin 5 MG TABS Take 5-10 mg by mouth at bedtime as needed (sleep).  . ranitidine (ZANTAC) 150 MG tablet Take 150 mg by mouth 2 (two) times daily as needed for heartburn.  . Simethicone (ANTI GAS PO) Take 1-2 capsules by mouth daily as needed (gas).    FAMILY HISTORY:  His indicated that his mother is deceased. He indicated that his father is deceased. He indicated that his daughter is deceased.   SOCIAL HISTORY: He  reports that he has been smoking cigarettes.  He has a 55.00 pack-year smoking history. he has never used smokeless tobacco. He reports that he does not drink alcohol or use drugs.  REVIEW OF SYSTEMS:   The patient is unable to give ROS secondary to his encephalopathy.  SUBJECTIVE:  No obvious complaints presently  VITAL SIGNS: BP (!) 118/101   Pulse 73   Temp (!) 97.4 F (36.3 C) (Oral)   Resp 16   Ht 5\' 11"  (1.803 m) Comment: Simultaneous filing. User may not have seen previous data.  Wt 179 lb (81.2 kg) Comment: Simultaneous filing. User may not have seen previous data.  SpO2 100%   BMI 24.97 kg/m   HEMODYNAMICS:    VENTILATOR SETTINGS: FiO2 (%):  [40 %] 40 %  INTAKE / OUTPUT: I/O last 3 completed shifts: In: 2158.9 [I.V.:1936.4; IV Piggyback:222.5] Out: 1665 [Urine:1285; Drains:80; Blood:300]  PHYSICAL EXAMINATION: General: Patient is obtunded but arouses to stimuli. He is on BIPAP. The patient is  mnoderately kyphotic Neuro:  He appears capable of moving all extremities HEENT: [place]/AT Cardiovascular:  RRRs1S2 Lungs:  Bilateral distantt BS Abdomen:  Soft, BS, non-tender Musculoskeletal: No gross swelling of extremities Skin: Intact  LABS:  BMET Recent Labs  Lab 03/15/17 1736 03/15/17 1942 03/16/17 0308  NA 115* 115* 116*  K 2.8* 3.1* 3.9  CL 79* 78* 83*  CO2 24 24 23    BUN 7 7 9   CREATININE 0.54* 0.50* 0.59*  GLUCOSE 129* 120* 100*    Electrolytes Recent Labs  Lab 03/15/17 1736 03/15/17 1942 03/16/17 0308  CALCIUM 9.2 9.2 8.8*    CBC Recent Labs  Lab 03/15/17 1434 03/16/17 0308  WBC  --  17.6*  HGB 12.6* 9.7*  HCT 37.0* 25.7*  PLT  --  188    Coag's No results for input(s): APTT, INR in the last 168 hours.  Sepsis Markers No results for input(s): LATICACIDVEN, PROCALCITON, O2SATVEN in the last 168 hours.  ABG Recent Labs  Lab 03/15/17 1434 03/15/17 1536 03/15/17 2058  PHART 7.303* 7.380 7.503*  PCO2ART 56.3* 46.8 31.7*  PO2ART 110.0* 92.2 75.3*    Liver Enzymes No results for input(s): AST, ALT, ALKPHOS, BILITOT, ALBUMIN in the last 168 hours.  Cardiac Enzymes No results for input(s): TROPONINI, PROBNP in the last 168 hours.  Glucose No results for input(s): GLUCAP in the last 168 hours.  Imaging X-ray Chest Pa Or Ap  Result Date: 03/15/2017 CLINICAL DATA:  Shortness of breath. EXAM: CHEST 1 VIEW COMPARISON:  None. FINDINGS: The patient is slightly rotated to the left. Normal heart size. Accentuation of the right paratracheal stripe is likely due to rotation. Elevation of the left hemidiaphragm. Bibasilar atelectasis. No focal consolidation, pleural effusion, or pneumothorax. No acute osseous abnormality. IMPRESSION: 1. Accentuation of the right paratracheal stripe is likely due to patient rotation. Recommend dedicated PA and lateral views to exclude underlying adenopathy/mass. 2. Bibasilar atelectasis. Electronically Signed   By: Obie Dredge M.D.   On: 03/15/2017 14:43   Dg Pelvis Portable  Result Date: 03/15/2017 CLINICAL DATA:  Right hip replacement. EXAM: PORTABLE PELVIS 1-2 VIEWS COMPARISON:  None. FINDINGS: AP view of the pelvis demonstrates right hip arthroplasty, without acute hardware complication. Severe left hip osteoarthritis including joint space narrowing and subchondral sclerosis. IMPRESSION: Expected  appearance after right hip arthroplasty. Electronically Signed   By: Jeronimo Greaves M.D.   On: 03/15/2017 14:43   Dg C-arm 1-60 Min-no Report  Result Date: 03/15/2017 Fluoroscopy was utilized by the requesting physician.  No radiographic interpretation.     ASSESSMENT / PLAN: Encephalopathy The patient is cognitively much improved and able to converse with [place] off of Bipap.' The sodiuim appears to be slowly climbing. This does not appear to be SIADH. Unclear if the patient had some extra-renal sodium loss  T   Acute Respiratory Failure I t does not appear that thepatient received sedation intraoperatively. Nevertheeess,he did develop an acute respiratory acidosis. It apears to be responding to BIPAP. Will recheck ABG once again shortly and monittor neuro staus in the ICU. 2/14:: It appears that the acute resp. Failure has resolved. last ABG suggests a slight resp. alkalosis  Hyponatremia Unclear cause. W/U in progress. Patient appears to be responding to nacL presently.  The last sodium was 116. The patine  has a hypotonic hyponatermia. No rent suggestion of major diarrhea The patient appears neither fluid overloaded or dry. Urine osmolarity (316)> serum osmolarity (241). The low urien sodium is not consistent with SIADH. The  patient has had a low sodiumgoing back at least 2 weeks. On 1/29 his sodium was recoreded as 124 but unsure if any further w/u was undertaken at that time.           Leslie Andrea MD Pulmonary and Critical Care Medicine [facility] Pager: [phone]  03/16/2017, 8:02 AM

## 2017-03-16 NOTE — Anesthesia Postprocedure Evaluation (Signed)
Anesthesia Post Note  Patient: Randy Burgess  Procedure(s) Performed: RIGHT TOTAL HIP ARTHROPLASTY ANTERIOR APPROACH (Right Hip)     Patient location during evaluation: PACU Anesthesia Type: Spinal Level of consciousness: awake, patient uncooperative and responds to stimulation Pain management: pain level controlled Vital Signs Assessment: post-procedure vital signs reviewed and stable Respiratory status: spontaneous breathing and respiratory function unstable (On BiPap) Cardiovascular status: blood pressure returned to baseline and stable Postop Assessment: spinal receding and no apparent nausea or vomiting Anesthetic complications: yes Anesthetic complication details: respiratory eventComments: Patient arrived in PACU obtunded with nasal airway in place, labored breathing with upper airway obstruction requiring jaw thrust to maintain airway.SpO2's 94-99%. 12 lead EKG obtained as well as PCXR and ABG. 12 lead showed NSR unchanged from preop, CXR showed clear lung fields and ABG showed acute respiratory acidosis. After initiating Bipap patient became more combative and confused, pulling at IV and mask. Started on Precedex infusion. Recommend admisssion to stepdown unit.                  Cecillia Menees A.

## 2017-03-16 NOTE — Evaluation (Signed)
Physical Therapy Evaluation Patient Details Name: Randy Burgess MRN: [id number] DOB: 1945/10/31 Today's Date: 03/16/2017   History of Present Illness  Pt s/p R THR and with post op encephalopathy  Clinical Impression  Pt s/p R THR and presents with decreased R LE strength/ROM, post op pain and post op encephalopathy limiting functional mobility.  Pt should progress to dc home but pt is unclear on level of assist available at home..    Follow Up Recommendations Follow surgeon's recommendation for DC plan and follow-up therapies    Equipment Recommendations  (TBD when pt is more cognitively clear)    Recommendations for Other Services OT consult     Precautions / Restrictions Precautions Precautions: Fall Restrictions Weight Bearing Restrictions: No Other Position/Activity Restrictions: WBAT      Mobility  Bed Mobility Overal bed mobility: Needs Assistance Bed Mobility: Supine to Sit;Sit to Supine     Supine to sit: Mod assist;+2 for physical assistance;+2 for safety/equipment Sit to supine: Mod assist;+2 for physical assistance;+2 for safety/equipment   General bed mobility comments: INcreased time with cues for sequence and use of L LE to self assist  Transfers Overall transfer level: Needs assistance Equipment used: Rolling walker (2 wheeled) Transfers: Sit to/from Stand Sit to Stand: Min assist;Mod assist;+2 physical assistance;+2 safety/equipment;From elevated surface         General transfer comment: cues for safety, transition position and use of UEs to self assist  Ambulation/Gait Ambulation/Gait assistance: Min assist;+2 physical assistance;+2 safety/equipment Ambulation Distance (Feet): 5 Feet Assistive device: Rolling walker (2 wheeled) Gait Pattern/deviations: Step-to pattern;Decreased step length - right;Decreased step length - left;Shuffle;Trunk flexed Gait velocity: decr Gait velocity interpretation: Below normal speed for age/gender General Gait  Details: Cues for posture, position from RW and sequence.  Pt side stepped up bedside only for safety 2* extremely stooped posture and difficulty with following commands.  Stairs            Wheelchair Mobility    Modified Rankin (Stroke Patients Only)       Balance                                             Pertinent Vitals/Pain Pain Assessment: Faces Faces Pain Scale: Hurts little more Pain Location: R hip Pain Descriptors / Indicators: Sore Pain Intervention(s): Limited activity within patient's tolerance;Monitored during session;Premedicated before session    Home Living Family/patient expects to be discharged to:: Private residence Living Arrangements: Alone Available Help at Discharge: Other (Comment)(Pt unclear on level of assist at home) Type of Home: House Home Access: Stairs to enter   [employer] of Steps: 1 Home Layout: One level Home Equipment: Walker - 2 wheels      Prior Function Level of Independence: Independent               Hand Dominance        Extremity/Trunk Assessment   Upper Extremity Assessment Upper Extremity Assessment: Overall WFL for tasks assessed    Lower Extremity Assessment Lower Extremity Assessment: RLE deficits/detail       Communication   Communication: No difficulties  Cognition Arousal/Alertness: Lethargic Behavior During Therapy: Flat affect Overall Cognitive Status: Impaired/Different from baseline Area of Impairment: Following commands;Safety/judgement;Problem solving;Memory                     Memory: Decreased short-term memory Following Commands: Follows  one step commands inconsistently Safety/Judgement: Decreased awareness of safety;Decreased awareness of deficits   Problem Solving: Slow processing;Decreased initiation        General Comments      Exercises     Assessment/Plan    PT Assessment Patient needs continued PT services  PT Problem List  Decreased strength;Decreased range of motion;Decreased activity tolerance;Decreased mobility;Pain;Decreased knowledge of use of DME       PT Treatment Interventions DME instruction;Gait training;Stair training;Functional mobility training;Therapeutic activities;Therapeutic exercise;Patient/family education    PT Goals (Current goals can be found in the Care Plan section)  Acute Rehab PT Goals Patient Stated Goal: Go home PT Goal Formulation: Patient unable to participate in goal setting Time For Goal Achievement: 12-05-2017 Potential to Achieve Goals: Good    Frequency 7X/week   Barriers to discharge        Co-evaluation               AM-PAC PT "6 Clicks" Daily Activity  Outcome Measure Difficulty turning over in bed (including adjusting bedclothes, sheets and blankets)?: Unable Difficulty moving from lying on back to sitting on the side of the bed? : Unable Difficulty sitting down on and standing up from a chair with arms (e.g., wheelchair, bedside commode, etc,.)?: Unable Help needed moving to and from a bed to chair (including a wheelchair)?: A Lot Help needed walking in hospital room?: A Lot Help needed climbing 3-5 steps with a railing? : Total 6 Click Score: 8    End of Session Equipment Utilized During Treatment: Gait belt Activity Tolerance: Patient limited by fatigue;Patient limited by lethargy Patient left: in bed;with call bell/phone within reach;with restraints reapplied Nurse Communication: Mobility status PT Visit Diagnosis: Unsteadiness on feet (R26.81);Difficulty in walking, not elsewhere classified (R26.2)    Time: [id number] PT Time Calculation (min) (ACUTE ONLY): 30 min   Charges:   PT Evaluation $PT Eval Moderate Complexity: 1 Mod PT Treatments $Therapeutic Activity: 8-22 mins   PT G Codes:        Pg [phone]   Daran Favaro 03/16/2017, 12:49 PM

## 2017-03-16 NOTE — Progress Notes (Signed)
Pt is more alert, but still confused. Has pulled out 2 Peripheral IVs, and has pulled out wound drain. RN notified on call P.A. regarding wound drain. RN has placed mittens on Pt and will continue to monitor.

## 2017-03-16 NOTE — Progress Notes (Signed)
   Subjective: 1 Day Post-Op Procedure(s) (LRB): RIGHT TOTAL HIP ARTHROPLASTY ANTERIOR APPROACH (Right) Patient reports pain as moderate.   Patient seen in rounds with Dr. Lequita HaltAluisio. Greatly Appreciate CCM assistance with the patient. Patient is having problems with pain in the hip, requiring pain medications Transferred to Step Down yesterday following surgery.  Resp distressed in PACU requiring BiPAP. Slow to wake up in PACU, disoriented and pulling at mask. Plan is to go Home after hospital stay.  Objective: Vital signs in last 24 hours: Temp:  [96.9 F (36.1 C)-99 F (37.2 C)] 97.4 F (36.3 C) (02/14 0711) Pulse Rate:  [60-82] 67 (02/14 0800) Resp:  [9-26] 9 (02/14 0800) BP: (79-217)/(41-202) 96/52 (02/14 0800) SpO2:  [91 %-100 %] 100 % (02/14 0800) FiO2 (%):  [40 %] 40 % (02/13 1545) Weight:  [81.2 kg (179 lb)] 81.2 kg (179 lb) (02/13 1040)  Intake/Output from previous day:  Intake/Output Summary (Last 24 hours) at 03/16/2017 0901 Last data filed at 03/16/2017 0800 Gross per 24 hour  Intake 2387.31 ml  Output 1665 ml  Net 722.31 ml    Intake/Output this shift: Total I/O In: 114.2 [I.V.:114.2] Out: -   Labs: Recent Labs    03/15/17 1434 03/16/17 0308  HGB 12.6* 9.7*   Recent Labs    03/15/17 1434 03/16/17 0308  WBC  --  17.6*  RBC  --  3.13*  HCT 37.0* 25.7*  PLT  --  188   Recent Labs    03/15/17 1942 03/16/17 0308  NA 115* 116*  K 3.1* 3.9  CL 78* 83*  CO2 24 23  BUN 7 9  CREATININE 0.50* 0.59*  GLUCOSE 120* 100*  CALCIUM 9.2 8.8*   No results for input(s): LABPT, INR in the last 72 hours.  EXAM General - Patient is Alert and but will start to slur and dose off at times.  Only is comprehendable part of the visit. Extremity - Neurovascular intact Sensation intact distally Dressing - dressing C/D/I Motor Function - intact, moving foot and toes well on exam.    Past Medical History:  Diagnosis Date  . DDD (degenerative disc disease),  lumbar   . Essential hypertension    + LVH on EKG 02/01/17.  Started hctz 25mg  qd 02/01/17.  . Frequent headaches   . 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 Lequita HaltAluisio    Assessment/Plan: 1 Day Post-Op Procedure(s) (LRB): RIGHT TOTAL HIP ARTHROPLASTY ANTERIOR APPROACH (Right) Principal Problem:   OA (osteoarthritis) of hip Active Problems:   H/O total hip arthroplasty  Estimated body mass index is 24.97 kg/m as calculated from the following:   Height as of this encounter: 5\' 11"  (1.803 m).   Weight as of this encounter: 81.2 kg (179 lb).  Greatly Appreciate CCM LOW SODIUM noted.   Hopefully electrolytes will improve quickly and mentation will improve.  DVT Prophylaxis - Xarelto Weight Bearing As Tolerated right Leg Hemovac Pulled Begin Therapy  Avel Peacerew Perkins, PA-C Orthopaedic Surgery 03/16/2017, 9:01 AM

## 2017-03-16 NOTE — Care Management Note (Signed)
Case Management Note  Patient Details  Name: Randy Burgess MRN: [id number] Date of Birth: 1945/03/23  Subjective/Objective:                  Hip surg. Post op resp. Distress requiring bipap, sand now o2 at 40%  Action/Plan: Date: March 16, 2017 Marcelle SmilingRhonda Maanvi Lecompte, BSN, BonaparteRN3, [place]CCM [phone] Chart and notes review for patient progress and needs. Will follow for case management and discharge needs. No cm or discharge needs present at time of this review. Next review date: [id number]  Expected Discharge Date:                  Expected Discharge Plan:  Home w Home Health Services  In-House Referral:     Discharge planning Services  CM Consult  Post Acute Care Choice:    Choice offered to:     DME Arranged:    DME Agency:     HH Arranged:    HH Agency:     Status of Service:  In process, will continue to follow  If discussed at Long Length of Stay Meetings, dates discussed:    Additional Comments:  Golda AcreDavis, Kimmora Risenhoover Lynn, RN 03/16/2017, 8:45 AM

## 2017-03-17 ENCOUNTER — Other Ambulatory Visit: Payer: Self-pay

## 2017-03-17 DIAGNOSIS — Z96641 Presence of right artificial hip joint: Secondary | ICD-10-CM

## 2017-03-17 DIAGNOSIS — M1611 Unilateral primary osteoarthritis, right hip: Secondary | ICD-10-CM

## 2017-03-17 DIAGNOSIS — G9340 Encephalopathy, unspecified: Secondary | ICD-10-CM

## 2017-03-17 DIAGNOSIS — E871 Hypo-osmolality and hyponatremia: Secondary | ICD-10-CM

## 2017-03-17 LAB — CBC
HCT: 19.8 % — ABNORMAL LOW (ref 39.0–52.0)
Hemoglobin: 7.6 g/dL — ABNORMAL LOW (ref 13.0–17.0)
MCH: 32.2 pg (ref 26.0–34.0)
MCHC: 38.4 g/dL — ABNORMAL HIGH (ref 30.0–36.0)
MCV: 83.9 fL (ref 78.0–100.0)
PLATELETS: 165 10*3/uL (ref 150–400)
RBC: 2.36 MIL/uL — ABNORMAL LOW (ref 4.22–5.81)
RDW: 11.9 % (ref 11.5–15.5)
WBC: 18.6 10*3/uL — AB (ref 4.0–10.5)

## 2017-03-17 LAB — BASIC METABOLIC PANEL
ANION GAP: 10 (ref 5–15)
BUN: 15 mg/dL (ref 6–20)
CALCIUM: 8.7 mg/dL — AB (ref 8.9–10.3)
CO2: 20 mmol/L — ABNORMAL LOW (ref 22–32)
CREATININE: 0.64 mg/dL (ref 0.61–1.24)
Chloride: 90 mmol/L — ABNORMAL LOW (ref 101–111)
Glucose, Bld: 98 mg/dL (ref 65–99)
Potassium: 4.4 mmol/L (ref 3.5–5.1)
Sodium: 120 mmol/L — ABNORMAL LOW (ref 135–145)

## 2017-03-17 LAB — PREPARE RBC (CROSSMATCH)

## 2017-03-17 LAB — URIC ACID, RANDOM URINE: URIC ACID, URINE: 22.6 mg/dL

## 2017-03-17 MED ORDER — FAMOTIDINE 20 MG PO TABS
20.0000 mg | ORAL_TABLET | Freq: Two times a day (BID) | ORAL | Status: DC
  Administered 2017-03-17 – 2017-03-29 (×21): 20 mg via ORAL
  Filled 2017-03-17 (×23): qty 1

## 2017-03-17 MED ORDER — ACETAMINOPHEN 325 MG PO TABS
650.0000 mg | ORAL_TABLET | Freq: Once | ORAL | Status: AC
  Administered 2017-03-17: 650 mg via ORAL
  Filled 2017-03-17: qty 2

## 2017-03-17 MED ORDER — GUAIFENESIN ER 600 MG PO TB12
1200.0000 mg | ORAL_TABLET | Freq: Two times a day (BID) | ORAL | Status: DC
  Administered 2017-03-17 – 2017-03-29 (×21): 1200 mg via ORAL
  Filled 2017-03-17 (×22): qty 2

## 2017-03-17 MED ORDER — SALINE SPRAY 0.65 % NA SOLN
2.0000 | NASAL | Status: DC | PRN
  Administered 2017-03-17: 2 via NASAL
  Filled 2017-03-17: qty 44

## 2017-03-17 MED ORDER — AMLODIPINE BESYLATE 10 MG PO TABS
10.0000 mg | ORAL_TABLET | Freq: Every day | ORAL | Status: DC
  Administered 2017-03-17 – 2017-03-29 (×11): 10 mg via ORAL
  Filled 2017-03-17 (×12): qty 1

## 2017-03-17 MED ORDER — IPRATROPIUM-ALBUTEROL 0.5-2.5 (3) MG/3ML IN SOLN
3.0000 mL | Freq: Four times a day (QID) | RESPIRATORY_TRACT | Status: DC
  Administered 2017-03-17 – 2017-03-22 (×18): 3 mL via RESPIRATORY_TRACT
  Filled 2017-03-17 (×15): qty 3
  Filled 2017-03-17: qty 42
  Filled 2017-03-17 (×4): qty 3

## 2017-03-17 MED ORDER — BENAZEPRIL HCL 20 MG PO TABS
20.0000 mg | ORAL_TABLET | Freq: Every day | ORAL | Status: DC
  Administered 2017-03-17 – 2017-03-29 (×11): 20 mg via ORAL
  Filled 2017-03-17 (×13): qty 1

## 2017-03-17 MED ORDER — OXYCODONE HCL 5 MG PO TABS
5.0000 mg | ORAL_TABLET | Freq: Four times a day (QID) | ORAL | Status: DC | PRN
  Administered 2017-03-17 – 2017-03-18 (×6): 10 mg via ORAL
  Filled 2017-03-17 (×6): qty 2

## 2017-03-17 MED ORDER — SODIUM CHLORIDE 0.9 % IV SOLN
Freq: Once | INTRAVENOUS | Status: AC
Start: 2017-03-17 — End: 2017-03-17
  Administered 2017-03-17: 18:00:00 via INTRAVENOUS

## 2017-03-17 NOTE — Progress Notes (Signed)
Physical Therapy Treatment Patient Details Name: Randy PickerelKenneth Burgess MRN: [id number] DOB: 07/29/1945 Today's Date: 03/17/2017    History of Present Illness Pt s/p R THR and with post op encephalopathy    PT Comments    POD # 2 am session (pt in step down ICU) Post op confusion appears resolved.  Pt restless and eager to go home home.  Very congested with some difficulty breathing present with cough and nasal mucous. Remained on 3 lts nasal avg sats 90%.   Assisted OOB to amb a limited distance.  Pt hunched over walker and unable to stand fully upright.  Required + 2 assist for safety and to have recliner follow behind.    Follow Up Recommendations  Follow surgeon's recommendation for DC plan and follow-up therapies     Equipment Recommendations  Other (comment)(TBD)    Recommendations for Other Services       Precautions / Restrictions Precautions Precautions: Fall Restrictions Weight Bearing Restrictions: No Other Position/Activity Restrictions: WBAT    Mobility  Bed Mobility Overal bed mobility: Needs Assistance Bed Mobility: Supine to Sit     Supine to sit: Mod assist;+2 for physical assistance;+2 for safety/equipment     General bed mobility comments: INcreased time with cues for sequence and use of L LE to self assist  Transfers Overall transfer level: Needs assistance Equipment used: Rolling walker (2 wheeled) Transfers: Sit to/from Stand Sit to Stand: Min assist;Mod assist;+2 physical assistance;+2 safety/equipment;From elevated surface         General transfer comment: cues for safety, transition position and use of UEs to self assist  Ambulation/Gait Ambulation/Gait assistance: Min assist;+2 physical assistance;+2 safety/equipment Ambulation Distance (Feet): 8 Feet Assistive device: Rolling walker (2 wheeled) Gait Pattern/deviations: Step-to pattern;Decreased step length - right;Decreased step length - left;Shuffle;Trunk flexed Gait velocity: decr    General Gait Details: + 2 assist for safety such that recliner was following.  Pt forward flex over walker despite MAX VC's to increase upright posture    Stairs            Wheelchair Mobility    Modified Rankin (Stroke Patients Only)       Balance                                            Cognition Arousal/Alertness: Awake/alert Behavior During Therapy: Restless                                   General Comments: required repeat VC's as pt was restless "ready to go home" (currently in step down)       Exercises      General Comments        Pertinent Vitals/Pain Pain Assessment: Faces Faces Pain Scale: Hurts little more Pain Location: R hip Pain Descriptors / Indicators: Sore Pain Intervention(s): Monitored during session;Repositioned;Ice applied    Home Living                      Prior Function            PT Goals (current goals can now be found in the care plan section) Progress towards PT goals: Progressing toward goals    Frequency    7X/week      PT Plan Current plan remains appropriate  Co-evaluation              AM-PAC PT "6 Clicks" Daily Activity  Outcome Measure  Difficulty turning over in bed (including adjusting bedclothes, sheets and blankets)?: Unable Difficulty moving from lying on back to sitting on the side of the bed? : Unable Difficulty sitting down on and standing up from a chair with arms (e.g., wheelchair, bedside commode, etc,.)?: Unable Help needed moving to and from a bed to chair (including a wheelchair)?: A Lot Help needed walking in hospital room?: A Lot Help needed climbing 3-5 steps with a railing? : Total 6 Click Score: 8    End of Session Equipment Utilized During Treatment: Gait belt Activity Tolerance: Patient limited by fatigue Patient left: in chair;with call bell/phone within reach Nurse Communication: Mobility status PT Visit Diagnosis:  Unsteadiness on feet (R26.81);Difficulty in walking, not elsewhere classified (R26.2)     Time: 1610-9604 PT Time Calculation (min) (ACUTE ONLY): 25 min  Charges:  $Gait Training: 8-22 mins $Therapeutic Activity: 8-22 mins                    G Codes:       Felecia Shelling  PTA [facility]  Acute  Rehab Pager      [phone]

## 2017-03-17 NOTE — Progress Notes (Signed)
   Subjective: 2 Days Post-Op Procedure(s) (LRB): RIGHT TOTAL HIP ARTHROPLASTY ANTERIOR APPROACH (Right) Patient reports pain as moderate.   Patient seen in rounds for Dr. Lequita HaltAluisio. Patient is having problems with pain in the hip, requiring pain medications   Objective: Vital signs in last 24 hours: Temp:  [97.1 F (36.2 C)-98.2 F (36.8 C)] 97.8 F (36.6 C) (02/15 0722) Pulse Rate:  [25-119] 83 (02/15 0500) Resp:  [9-24] 12 (02/15 0500) BP: (113-171)/(44-85) 115/59 (02/15 0500) SpO2:  [86 %-100 %] 97 % (02/15 0500)  Intake/Output from previous day:  Intake/Output Summary (Last 24 hours) at 03/17/2017 0957 Last data filed at 03/17/2017 0500 Gross per 24 hour  Intake 2739.16 ml  Output 1950 ml  Net 789.16 ml    Intake/Output this shift: No intake/output data recorded.  Labs: Recent Labs    03/15/17 1434 03/16/17 0308 03/17/17 0301  HGB 12.6* 9.7* 7.6*   Recent Labs    03/16/17 0308 03/17/17 0301  WBC 17.6* 18.6*  RBC 3.13* 2.36*  HCT 25.7* 19.8*  PLT 188 165   Recent Labs    03/16/17 0308 03/17/17 0301  NA 116* 120*  K 3.9 4.4  CL 83* 90*  CO2 23 20*  BUN 9 15  CREATININE 0.59* 0.64  GLUCOSE 100* 98  CALCIUM 8.8* 8.7*   No results for input(s): LABPT, INR in the last 72 hours.  EXAM General - Patient is Alert and able to answer some questions appropriately  Extremity - Neurovascular intact Sensation intact distally Dressing - dressing C/D/I Motor Function - intact, moving foot and toes well on exam.   Past Medical History:  Diagnosis Date  . DDD (degenerative disc disease), lumbar   . Essential hypertension    + LVH on EKG 02/01/17.  Started hctz 25mg  qd 02/01/17.  . Frequent headaches   . 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 Lequita HaltAluisio    Assessment/Plan: 2 Days Post-Op Procedure(s) (LRB): RIGHT TOTAL HIP ARTHROPLASTY ANTERIOR APPROACH (Right) Principal Problem:   OA  (osteoarthritis) of hip Active Problems:   H/O total hip arthroplasty  Estimated body mass index is 24.97 kg/m as calculated from the following:   Height as of this encounter: 5\' 11"  (1.803 m).   Weight as of this encounter: 81.2 kg (179 lb). Up with therapy  DVT Prophylaxis - Xarelto Weight Bearing As Tolerated right Leg   Randy Peacerew Alim Cattell, PA-C Orthopaedic Surgery 03/17/2017, 9:57 AM

## 2017-03-17 NOTE — Progress Notes (Signed)
PULMONARY / CRITICAL CARE MEDICINE   Name: Randy PickerelKenneth Burgess MRN: [id number] DOB: 06-Jan-1946    ADMISSION DATE:  03/15/2017 CONSULTATION DATE:  03/15/2016  REFERRING MD:  Despina HickAlusio  CHIEF COMPLAINT:  Confusion, hyponatremia  HISTORY OF PRESENT ILLNESS:   72 y/o male underwent an elective R total hip replacement on 2/13 was brought to the ICU confused, in respiratory failure on BIPAP post operatively.  He was found to have hyponatremia on admission to the ICU.  He has an extensive smoking history.     SUBJECTIVE:  Had some constipation this week Didn't sleep well due to R hip pain Sodium improved  VITAL SIGNS: BP (!) 115/59   Pulse 83   Temp 97.8 F (36.6 C) (Oral)   Resp 12   Ht 5\' 11"  (1.803 m) Comment: Simultaneous filing. User may not have seen previous data.  Wt 179 lb (81.2 kg) Comment: Simultaneous filing. User may not have seen previous data.  SpO2 97%   BMI 24.97 kg/m   HEMODYNAMICS:    VENTILATOR SETTINGS:    INTAKE / OUTPUT: I/O last 3 completed shifts: In: 3981.8 [P.O.:480; I.V.:3501.8] Out: 2425 [Urine:2425]  PHYSICAL EXAMINATION:  General:  Resting in bed HENT: NCAT OP clear PULM: Upper airway rhonchi noted, normal effort, normal effort CV: Tachycardic, regular, no mgr GI: BS+, soft, nontender MSK: normal bulk and tone Neuro: awake, alert, no distress, MAEW   LABS:  BMET Recent Labs  Lab 03/15/17 1942 03/16/17 0308 03/17/17 0301  NA 115* 116* 120*  K 3.1* 3.9 4.4  CL 78* 83* 90*  CO2 24 23 20*  BUN 7 9 15   CREATININE 0.50* 0.59* 0.64  GLUCOSE 120* 100* 98    Electrolytes Recent Labs  Lab 03/15/17 1942 03/16/17 0308 03/17/17 0301  CALCIUM 9.2 8.8* 8.7*    CBC Recent Labs  Lab 03/15/17 1434 03/16/17 0308 03/17/17 0301  WBC  --  17.6* 18.6*  HGB 12.6* 9.7* 7.6*  HCT 37.0* 25.7* 19.8*  PLT  --  188 165    Coag's No results for input(s): APTT, INR in the last 168 hours.  Sepsis Markers No results for input(s):  LATICACIDVEN, PROCALCITON, O2SATVEN in the last 168 hours.  ABG Recent Labs  Lab 03/15/17 1434 03/15/17 1536 03/15/17 2058  PHART 7.303* 7.380 7.503*  PCO2ART 56.3* 46.8 31.7*  PO2ART 110.0* 92.2 75.3*    Liver Enzymes No results for input(s): AST, ALT, ALKPHOS, BILITOT, ALBUMIN in the last 168 hours.  Cardiac Enzymes No results for input(s): TROPONINI, PROBNP in the last 168 hours.  Glucose No results for input(s): GLUCAP in the last 168 hours.  Imaging No results found.   STUDIES:    CULTURES:   ANTIBIOTICS:   SIGNIFICANT EVENTS:   LINES/TUBES:   DISCUSSION: 72 y/o male with confusion, cough, mild respiratory distress after an elective R hip replacement.  Noted to be hyponatremic.  ASSESSMENT / PLAN:  PULMONARY A: Cough, chest congestion > acutely worse 2/15 Smoker, presume COPD P:   Needs outpatient PFT Start guaifenesin Out of bed Incentive spirometry Flutter valve Start duoneb  CARDIOVASCULAR A:  Sinus tachycardia, hemodynamically stable Hypertension > worse 2/15 P:  Tele Hold HCTZ Restart amlodipine and benazepril  RENAL A:   Hyponatremia due to SIADH P:   Hold HCTZ indefinitely, do not restart on discharge Free water restrict Stop salt tablets Continue saline infusion, low dose (50cc/hr)  GASTROINTESTINAL A:   GERD P:   Restart H2 blocker bid Regular diet  HEMATOLOGIC A:  Anemia, no bleeding At risk for DVT P:  DVT prophylaxis per orthopeidcs  INFECTIOUS A:   No acute issues P:     ENDOCRINE A:   No acute issues P:   Monitor glucose  NEUROLOGIC A:   Acute encephalopathy> improving Post op pain P:   Stop precedex Stop benadryl OK to treat pain with narcotics as needed from my perspective  FAMILY  - Updates: none bedside  PCCM will see again on 2/16, then likely sign off.  If further assistance with medical management necessary then would recommend consulting [facility].  Heber Clare, MD [place]  PCCM Pager: [phone] Cell: [phone] After 3pm or if no response, call [phone]   03/17/2017, 9:50 AM

## 2017-03-17 NOTE — Progress Notes (Signed)
Pt not on BIPAP at this time.  

## 2017-03-17 NOTE — H&P (Signed)
Rt gave pt flutter valve. Pt knows and understands how to use. 

## 2017-03-18 ENCOUNTER — Inpatient Hospital Stay (HOSPITAL_COMMUNITY): Payer: Medicare (Managed Care)

## 2017-03-18 LAB — TYPE AND SCREEN
ABO/RH(D): A POS
Antibody Screen: NEGATIVE
UNIT DIVISION: 0
Unit division: 0

## 2017-03-18 LAB — CBC WITH DIFFERENTIAL/PLATELET
BASOS ABS: 0 10*3/uL (ref 0.0–0.1)
Basophils Relative: 0 %
EOS ABS: 0 10*3/uL (ref 0.0–0.7)
EOS PCT: 0 %
HCT: 24.3 % — ABNORMAL LOW (ref 39.0–52.0)
Hemoglobin: 9.2 g/dL — ABNORMAL LOW (ref 13.0–17.0)
Lymphocytes Relative: 4 %
Lymphs Abs: 0.7 10*3/uL (ref 0.7–4.0)
MCH: 31.6 pg (ref 26.0–34.0)
MCHC: 36.8 g/dL — ABNORMAL HIGH (ref 30.0–36.0)
MCV: 83.5 fL (ref 78.0–100.0)
Monocytes Absolute: 2.2 10*3/uL — ABNORMAL HIGH (ref 0.1–1.0)
Monocytes Relative: 11 %
Neutro Abs: 17.9 10*3/uL — ABNORMAL HIGH (ref 1.7–7.7)
Neutrophils Relative %: 85 %
PLATELETS: 144 10*3/uL — AB (ref 150–400)
RBC: 2.91 MIL/uL — AB (ref 4.22–5.81)
RDW: 12.8 % (ref 11.5–15.5)
WBC: 20.8 10*3/uL — AB (ref 4.0–10.5)

## 2017-03-18 LAB — BPAM RBC
BLOOD PRODUCT EXPIRATION DATE: 201903042359
BLOOD PRODUCT EXPIRATION DATE: 201903042359
ISSUE DATE / TIME: 201902151736
ISSUE DATE / TIME: 201902152122
UNIT TYPE AND RH: 6200
Unit Type and Rh: 6200

## 2017-03-18 LAB — GLUCOSE, CAPILLARY: Glucose-Capillary: 169 mg/dL — ABNORMAL HIGH (ref 65–99)

## 2017-03-18 LAB — BASIC METABOLIC PANEL
Anion gap: 11 (ref 5–15)
BUN: 10 mg/dL (ref 6–20)
CO2: 19 mmol/L — ABNORMAL LOW (ref 22–32)
CREATININE: 0.66 mg/dL (ref 0.61–1.24)
Calcium: 8.9 mg/dL (ref 8.9–10.3)
Chloride: 91 mmol/L — ABNORMAL LOW (ref 101–111)
Glucose, Bld: 132 mg/dL — ABNORMAL HIGH (ref 65–99)
POTASSIUM: 4.4 mmol/L (ref 3.5–5.1)
SODIUM: 121 mmol/L — AB (ref 135–145)

## 2017-03-18 MED ORDER — TRAZODONE HCL 50 MG PO TABS
50.0000 mg | ORAL_TABLET | ORAL | Status: AC
  Administered 2017-03-18: 50 mg via ORAL
  Filled 2017-03-18: qty 1

## 2017-03-18 MED ORDER — IOPAMIDOL (ISOVUE-300) INJECTION 61%
INTRAVENOUS | Status: AC
  Filled 2017-03-18: qty 75

## 2017-03-18 MED ORDER — SODIUM CHLORIDE 0.9 % IJ SOLN
INTRAMUSCULAR | Status: AC
  Filled 2017-03-18: qty 50

## 2017-03-18 MED ORDER — LORAZEPAM 2 MG/ML IJ SOLN
0.5000 mg | Freq: Three times a day (TID) | INTRAMUSCULAR | Status: DC | PRN
  Administered 2017-03-19: 1 mg via INTRAVENOUS
  Filled 2017-03-18 (×2): qty 1

## 2017-03-18 NOTE — Progress Notes (Signed)
Physical Therapy Treatment Patient Details Name: Randy Burgess MRN: [id number] DOB: 1946-01-04 Today's Date: 03/18/2017    History of Present Illness Pt s/p R THR and with post op encephalopathy    PT Comments    Pt with increased activity tolerance this am and ambulating in hall for first time.  Pt continues unsteady, impulsive and with poor insight into current condition, limitations and abilities.   Follow Up Recommendations  Follow surgeon's recommendation for DC plan and follow-up therapies     Equipment Recommendations       Recommendations for Other Services OT consult     Precautions / Restrictions Precautions Precautions: Fall Restrictions Weight Bearing Restrictions: No Other Position/Activity Restrictions: WBAT    Mobility  Bed Mobility               General bed mobility comments: Pt up in chair and requests back to same  Transfers Overall transfer level: Needs assistance Equipment used: Rolling walker (2 wheeled) Transfers: Sit to/from Stand Sit to Stand: Min assist;+2 physical assistance         General transfer comment: cues for safety, transition position and use of UEs to self assist  Ambulation/Gait Ambulation/Gait assistance: Min assist;+2 physical assistance;+2 safety/equipment Ambulation Distance (Feet): 38 Feet(and 15') Assistive device: Rolling walker (2 wheeled) Gait Pattern/deviations: Step-to pattern;Decreased step length - right;Decreased step length - left;Shuffle;Trunk flexed Gait velocity: decr Gait velocity interpretation: Below normal speed for age/gender General Gait Details: + 2 assist for safety such that recliner was following.  Pt forward flex over walker despite MAX VC's to increase upright posture    Stairs            Wheelchair Mobility    Modified Rankin (Stroke Patients Only)       Balance Overall balance assessment: Needs assistance Sitting-balance support: No upper extremity supported;Feet  supported Sitting balance-Leahy Scale: Good     Standing balance support: Bilateral upper extremity supported Standing balance-Leahy Scale: Poor                              Cognition Arousal/Alertness: Awake/alert Behavior During Therapy: Restless;Impulsive Overall Cognitive Status: No family/caregiver present to determine baseline cognitive functioning Area of Impairment: Following commands;Safety/judgement;Problem solving;Memory                     Memory: Decreased short-term memory Following Commands: Follows one step commands consistently Safety/Judgement: Decreased awareness of safety;Decreased awareness of deficits   Problem Solving: Slow processing;Decreased initiation General Comments: required repeat VC's as pt was restless "ready to go home" (currently in step down)       Exercises      General Comments        Pertinent Vitals/Pain Pain Assessment: 0-10 Pain Score: 7  Pain Location: R hip Pain Descriptors / Indicators: Aching;Sore Pain Intervention(s): Limited activity within patient's tolerance;Monitored during session;Premedicated before session;Ice applied    Home Living                      Prior Function            PT Goals (current goals can now be found in the care plan section) Acute Rehab PT Goals Patient Stated Goal: Go home PT Goal Formulation: Patient unable to participate in goal setting Time For Goal Achievement: 04-25-17 Potential to Achieve Goals: Good Progress towards PT goals: Progressing toward goals    Frequency    7X/week  PT Plan Current plan remains appropriate    Co-evaluation              AM-PAC PT "6 Clicks" Daily Activity  Outcome Measure  Difficulty turning over in bed (including adjusting bedclothes, sheets and blankets)?: Unable Difficulty moving from lying on back to sitting on the side of the bed? : Unable Difficulty sitting down on and standing up from a chair with  arms (e.g., wheelchair, bedside commode, etc,.)?: Unable Help needed moving to and from a bed to chair (including a wheelchair)?: A Lot Help needed walking in hospital room?: A Lot Help needed climbing 3-5 steps with a railing? : A Lot 6 Click Score: 9    End of Session Equipment Utilized During Treatment: Gait belt Activity Tolerance: Patient limited by fatigue;Patient limited by pain Patient left: in chair;with call bell/phone within reach Nurse Communication: Mobility status PT Visit Diagnosis: Unsteadiness on feet (R26.81);Difficulty in walking, not elsewhere classified (R26.2)     Time: [id number] PT Time Calculation (min) (ACUTE ONLY): 18 min  Charges:  $Gait Training: 8-22 mins                    G Codes:       Pg [phone]    Chas Axel 03/18/2017, 4:50 PM

## 2017-03-18 NOTE — Progress Notes (Signed)
Physical Therapy Treatment Patient Details Name: Randy PickerelKenneth Burgess MRN: [id number] DOB: 10-27-1945 Today's Date: 03/18/2017    History of Present Illness Pt s/p R THR and with post op encephalopathy    PT Comments    Initiated therex program.   Follow Up Recommendations  Follow surgeon's recommendation for DC plan and follow-up therapies     Equipment Recommendations  Other (comment)    Recommendations for Other Services OT consult     Precautions / Restrictions Precautions Precautions: Fall Restrictions Weight Bearing Restrictions: No Other Position/Activity Restrictions: WBAT    Mobility  Bed Mobility               General bed mobility comments: Pt up in chair and requests back to same  Transfers Overall transfer level: Needs assistance Equipment used: Rolling walker (2 wheeled) Transfers: Sit to/from Stand Sit to Stand: Min assist;+2 physical assistance         General transfer comment: cues for safety, transition position and use of UEs to self assist  Ambulation/Gait Ambulation/Gait assistance: Min assist;+2 physical assistance;+2 safety/equipment Ambulation Distance (Feet): 38 Feet(and 15') Assistive device: Rolling walker (2 wheeled) Gait Pattern/deviations: Step-to pattern;Decreased step length - right;Decreased step length - left;Shuffle;Trunk flexed Gait velocity: decr Gait velocity interpretation: Below normal speed for age/gender General Gait Details: + 2 assist for safety such that recliner was following.  Pt forward flex over walker despite MAX VC's to increase upright posture    Stairs            Wheelchair Mobility    Modified Rankin (Stroke Patients Only)       Balance Overall balance assessment: Needs assistance Sitting-balance support: No upper extremity supported;Feet supported Sitting balance-Leahy Scale: Good     Standing balance support: Bilateral upper extremity supported Standing balance-Leahy Scale: Poor                              Cognition Arousal/Alertness: Awake/alert Behavior During Therapy: Restless;Impulsive Overall Cognitive Status: No family/caregiver present to determine baseline cognitive functioning Area of Impairment: Following commands;Safety/judgement;Problem solving;Memory                     Memory: Decreased short-term memory Following Commands: Follows one step commands consistently Safety/Judgement: Decreased awareness of safety;Decreased awareness of deficits   Problem Solving: Slow processing;Decreased initiation General Comments: required repeat VC's as pt was restless "ready to go home" (currently in step down)       Exercises Total Joint Exercises Ankle Circles/Pumps: AROM;Both;15 reps;Supine Quad Sets: AROM;Both;10 reps;Supine Heel Slides: AAROM;Right;20 reps;Supine Hip ABduction/ADduction: AAROM;Right;15 reps;Supine    General Comments        Pertinent Vitals/Pain Pain Assessment: 0-10 Pain Score: 4  Pain Location: R hip Pain Descriptors / Indicators: Aching;Sore Pain Intervention(s): Limited activity within patient's tolerance;Monitored during session;Premedicated before session;Ice applied    Home Living                      Prior Function            PT Goals (current goals can now be found in the care plan section) Acute Rehab PT Goals Patient Stated Goal: Go home PT Goal Formulation: Patient unable to participate in goal setting Time For Goal Achievement: 01/02/18 Potential to Achieve Goals: Good Progress towards PT goals: Progressing toward goals    Frequency    7X/week      PT Plan Current plan remains appropriate  Co-evaluation              AM-PAC PT "6 Clicks" Daily Activity  Outcome Measure  Difficulty turning over in bed (including adjusting bedclothes, sheets and blankets)?: Unable Difficulty moving from lying on back to sitting on the side of the bed? : Unable Difficulty sitting  down on and standing up from a chair with arms (e.g., wheelchair, bedside commode, etc,.)?: Unable Help needed moving to and from a bed to chair (including a wheelchair)?: A Lot Help needed walking in hospital room?: A Lot Help needed climbing 3-5 steps with a railing? : A Lot 6 Click Score: 9    End of Session Equipment Utilized During Treatment: Gait belt Activity Tolerance: Patient tolerated treatment well Patient left: in chair;with call bell/phone within reach Nurse Communication: Mobility status PT Visit Diagnosis: Unsteadiness on feet (R26.81);Difficulty in walking, not elsewhere classified (R26.2)     Time: 1610-9604 PT Time Calculation (min) (ACUTE ONLY): 15 min  Charges:  $Gait Training: 8-22 mins $Therapeutic Exercise: 8-22 mins                    G Codes:       Pg [phone]    Randy Burgess 03/18/2017, 4:57 PM

## 2017-03-18 NOTE — Progress Notes (Signed)
PULMONARY / CRITICAL CARE MEDICINE   Name: Randy PickerelKenneth Burgess MRN: [id number] DOB: 11-03-1945    ADMISSION DATE:  03/15/2017 CONSULTATION DATE:  03/15/2016  REFERRING MD:  Despina HickAlusio  CHIEF COMPLAINT:  Confusion, hyponatremia  HISTORY OF PRESENT ILLNESS:   72 y/o male underwent an elective R total hip replacement on 2/13 was brought to the ICU confused, in respiratory failure on BIPAP post operatively.  He was found to have hyponatremia on admission to the ICU.  He has an extensive smoking history.   SUBJECTIVE:  No acute issues Sitting up at bedside.  Reports that he did not sleep all night as he was getting disturbed Reports that pain is under control.   VITAL SIGNS: BP 105/78   Pulse (!) 121   Temp 98.3 F (36.8 C) (Oral)   Resp 20   Ht 5\' 11"  (1.803 m) Comment: Simultaneous filing. User may not have seen previous data.  Wt 179 lb (81.2 kg) Comment: Simultaneous filing. User may not have seen previous data.  SpO2 91%   BMI 24.97 kg/m   HEMODYNAMICS:    VENTILATOR SETTINGS:    INTAKE / OUTPUT: I/O last 3 completed shifts: In: 4834.6 [P.O.:360; I.V.:3851.6; Blood:623] Out: 2950 [Urine:2950]  PHYSICAL EXAMINATION: Gen:      No acute distress HEENT:  EOMI, sclera anicteric Neck:     No masses; no thyromegaly Lungs:    Coarse rhonchi, no wheeze; normal respiratory effort CV:         Regular rate and rhythm; no murmurs Abd:      + bowel sounds; soft, non-tender; no palpable masses, no distension Ext:    No edema; adequate peripheral perfusion Skin:      Warm and dry; no rash Neuro: alert and oriented x 3 Psych: normal mood and affect  LABS:  BMET Recent Labs  Lab 03/16/17 0308 03/17/17 0301 03/18/17 0322  NA 116* 120* 121*  K 3.9 4.4 4.4  CL 83* 90* 91*  CO2 23 20* 19*  BUN 9 15 10   CREATININE 0.59* 0.64 0.66  GLUCOSE 100* 98 132*    Electrolytes Recent Labs  Lab 03/16/17 0308 03/17/17 0301 03/18/17 0322  CALCIUM 8.8* 8.7* 8.9    CBC Recent Labs   Lab 03/16/17 0308 03/17/17 0301 03/18/17 0322  WBC 17.6* 18.6* 20.8*  HGB 9.7* 7.6* 9.2*  HCT 25.7* 19.8* 24.3*  PLT 188 165 144*    Coag's No results for input(s): APTT, INR in the last 168 hours.  Sepsis Markers No results for input(s): LATICACIDVEN, PROCALCITON, O2SATVEN in the last 168 hours.  ABG Recent Labs  Lab 03/15/17 1434 03/15/17 1536 03/15/17 2058  PHART 7.303* 7.380 7.503*  PCO2ART 56.3* 46.8 31.7*  PO2ART 110.0* 92.2 75.3*    Liver Enzymes No results for input(s): AST, ALT, ALKPHOS, BILITOT, ALBUMIN in the last 168 hours.  Cardiac Enzymes No results for input(s): TROPONINI, PROBNP in the last 168 hours.  Glucose Recent Labs  Lab 03/18/17 0733  GLUCAP 169*    Imaging Chest x-ray 03/15/17-hemidiaphragm elevation.  Bibasilar atelectasis. I have reviewed the images personally.  STUDIES:    CULTURES:   ANTIBIOTICS:   SIGNIFICANT EVENTS:   LINES/TUBES:   DISCUSSION: 72 y/o male with confusion, cough, mild respiratory distress after an elective R hip replacement.  Noted to be hyponatremic.  ASSESSMENT / PLAN:  PULMONARY A: Cough, chest congestion Smoker, presume COPD P:   Continue Mucinex, incentive spirometry, flutter wall Duo nebs Likely has COPD.  Will need outpatient PFTs  CARDIOVASCULAR A:  Sinus tachycardia, hemodynamically stable Hypertension > worse 2/15 P:  Tele Hold HCTZ Restart amlodipine and benazepril  RENAL A:   Hyponatremia due to SIADH.  Sodium is slowly improving. P:   Holding hydrochlorothiazide.  Do not restart on discharge Free water restriction.  Continue normal saline at 50 cc an hour  GASTROINTESTINAL A:   GERD P:   Pepcid twice daily Regular diet  HEMATOLOGIC A:   Anemia, no bleeding At risk for DVT P:  DVT prophylaxis per orthopeidcs  INFECTIOUS A:   WBC count higher today.  No clear evidence of infection P:   Check chest x-ray.  Will ask Ortho team to reevaluate incision  site Blood culture if he spikes.  Okay to observe off antibiotics for now  ENDOCRINE A:   No acute issues P:   Monitor glucose  NEUROLOGIC A:   Acute encephalopathy> improving Post op pain P:   Oxycodone for pain  FAMILY  - Updates: Patient updated at bedside  PCCM is signing off as there are not critical care issues. Please call with any questions.  We will be available as needed.  Chilton Greathouse MD [facility] Pager [phone] If no answer or after 3pm call: [phone] 03/18/2017, 7:53 AM

## 2017-03-18 NOTE — Progress Notes (Signed)
eLink Physician-Brief Progress Note Patient Name: Randy PickerelKenneth Burgess DOB: 10-26-1945 MRN: [id number]   Date of Service  03/18/2017  HPI/Events of Note  Patient on CT table and has no access for ordered CT scan with contrast. Sat = 91% and RR = 26. BP = 202/150 and HR = 124. There is no documentation in Epic progress notes as to why the CT Scan was ordered.   eICU Interventions  Will order: 1. Hold off on CT Chest with contrast for tonight.  2. Portable CXR STAT.  3. Take patient back to ICU and put on BiPAP as ordered.      Intervention Category Major Interventions: Other:  Lenell AntuSommer,Steven Eugene 03/18/2017, 11:31 PM

## 2017-03-18 NOTE — Progress Notes (Signed)
    Subjective:  Patient reports pain as mild to moderate.  Denies N/V/CP/SOB. Hip pain controlled well. States he didn't sleep well due to being disturbed.  Objective:   VITALS:   Vitals:   03/18/17 0300 03/18/17 0400 03/18/17 0600 03/18/17 0800  BP:  (!) 93/59 105/78   Pulse: (!) 115 (!) 109 (!) 121   Resp: 19 17 20    Temp:  98.3 F (36.8 C)  97.9 F (36.6 C)  TempSrc:  Oral  Oral  SpO2: 99% 95% 91%   Weight:      Height:        NAD ABD soft Sensation intact distally Intact pulses distally Dorsiflexion/Plantar flexion intact Incision: dressing C/D/I Compartment soft Dressing changed: incis c/d/i without erythema   Lab Results  Component Value Date   WBC 20.8 (H) 03/18/2017   HGB 9.2 (L) 03/18/2017   HCT 24.3 (L) 03/18/2017   MCV 83.5 03/18/2017   PLT 144 (L) 03/18/2017   BMET    Component Value Date/Time   NA 121 (L) 03/18/2017 0322   K 4.4 03/18/2017 0322   CL 91 (L) 03/18/2017 0322   CO2 19 (L) 03/18/2017 0322   GLUCOSE 132 (H) 03/18/2017 0322   BUN 10 03/18/2017 0322   CREATININE 0.66 03/18/2017 0322   CALCIUM 8.9 03/18/2017 0322   GFRNONAA >60 03/18/2017 0322   GFRAA >60 03/18/2017 0322     Assessment/Plan: 3 Days Post-Op   Principal Problem:   OA (osteoarthritis) of hip Active Problems:   H/O total hip arthroplasty   WBAT with walker DVT ppx: xarelto, SCDs, TEDS PO pain control PT/OT Leukocytosis: no evidence of SSI, likely postop demargination Dispo: cont medical management    Iline OvenBrian J Theo Krumholz 03/18/2017, 9:15 AM   Samson FredericBrian Amrom Ore, MD Cell [phone]

## 2017-03-18 NOTE — Progress Notes (Signed)
Patient having diffifulty sleeping and requesting something for sleep. Paged on call PA. New order for trazodone placed in Epic and given. Continue to monitor.

## 2017-03-19 ENCOUNTER — Inpatient Hospital Stay (HOSPITAL_COMMUNITY): Payer: Medicare (Managed Care)

## 2017-03-19 LAB — COMPREHENSIVE METABOLIC PANEL
ALT: 35 U/L (ref 17–63)
AST: 64 U/L — AB (ref 15–41)
Albumin: 3.4 g/dL — ABNORMAL LOW (ref 3.5–5.0)
Alkaline Phosphatase: 75 U/L (ref 38–126)
Anion gap: 8 (ref 5–15)
BUN: 12 mg/dL (ref 6–20)
CHLORIDE: 91 mmol/L — AB (ref 101–111)
CO2: 21 mmol/L — AB (ref 22–32)
CREATININE: 0.68 mg/dL (ref 0.61–1.24)
Calcium: 9 mg/dL (ref 8.9–10.3)
GFR calc Af Amer: >60 mL/min (ref >60)
GFR calc non Af Amer: >60 mL/min (ref >60)
Glucose, Bld: 104 mg/dL — ABNORMAL HIGH (ref 65–99)
POTASSIUM: 4.9 mmol/L (ref 3.5–5.1)
SODIUM: 120 mmol/L — AB (ref 135–145)
Total Bilirubin: 0.9 mg/dL (ref 0.3–1.2)
Total Protein: 6.3 g/dL — ABNORMAL LOW (ref 6.5–8.1)

## 2017-03-19 LAB — BLOOD GAS, ARTERIAL
Acid-base deficit: 2 mmol/L (ref 0.0–2.0)
Bicarbonate: 21.4 mmol/L (ref 20.0–28.0)
DRAWN BY: 225631
O2 Content: 6 L/min
O2 Saturation: 85.4 %
Patient temperature: 37
RATE: 20 resp/min
pCO2 arterial: 33.1 mmHg (ref 32.0–48.0)
pH, Arterial: 7.427 (ref 7.350–7.450)
pO2, Arterial: 51.7 mmHg — ABNORMAL LOW (ref 83.0–108.0)

## 2017-03-19 MED ORDER — LORAZEPAM 2 MG/ML IJ SOLN: 1.0000 mg | INTRAMUSCULAR | Status: DC

## 2017-03-19 MED ORDER — TRAZODONE HCL 50 MG PO TABS
50.0000 mg | ORAL_TABLET | ORAL | Status: AC
  Administered 2017-03-19: 50 mg via ORAL
  Filled 2017-03-19: qty 1

## 2017-03-19 MED ORDER — VITAMIN B-1 100 MG PO TABS: 100.0000 mg | ORAL_TABLET | Freq: Every day | ORAL | Status: DC

## 2017-03-19 MED ORDER — PNEUMOCOCCAL VAC POLYVALENT 25 MCG/0.5ML IJ INJ
0.5000 mL | INJECTION | INTRAMUSCULAR | Status: AC
  Administered 2017-03-21: 0.5 mL via INTRAMUSCULAR
  Filled 2017-03-19: qty 0.5

## 2017-03-19 MED ORDER — FOLIC ACID 5 MG/ML IJ SOLN: 1.0000 mg | Freq: Every day | INTRAMUSCULAR | Status: DC

## 2017-03-19 MED ORDER — NICOTINE 21 MG/24HR TD PT24
21.0000 mg | MEDICATED_PATCH | Freq: Every day | TRANSDERMAL | Status: DC
  Administered 2017-03-19: 21 mg via TRANSDERMAL
  Filled 2017-03-19 (×8): qty 1

## 2017-03-19 MED ORDER — FOLIC ACID 1 MG PO TABS
1.0000 mg | ORAL_TABLET | Freq: Every day | ORAL | Status: DC
  Administered 2017-03-21 – 2017-03-29 (×9): 1 mg via ORAL
  Filled 2017-03-19 (×9): qty 1

## 2017-03-19 MED ORDER — THIAMINE HCL 100 MG/ML IJ SOLN
100.0000 mg | Freq: Every day | INTRAMUSCULAR | Status: DC
  Administered 2017-03-19: 100 mg via INTRAVENOUS
  Filled 2017-03-19: qty 2

## 2017-03-19 MED ORDER — LORAZEPAM 2 MG/ML IJ SOLN: 0.0000 mg | Freq: Two times a day (BID) | INTRAMUSCULAR | Status: DC

## 2017-03-19 MED ORDER — FOLIC ACID 1 MG PO TABS: 1.0000 mg | ORAL_TABLET | Freq: Every day | ORAL | Status: DC

## 2017-03-19 MED ORDER — ADULT MULTIVITAMIN W/MINERALS CH: 1.0000 | ORAL_TABLET | Freq: Every day | ORAL | Status: DC

## 2017-03-19 MED ORDER — LORAZEPAM 2 MG/ML IJ SOLN
0.0000 mg | Freq: Four times a day (QID) | INTRAMUSCULAR | Status: DC
  Administered 2017-03-19 (×2): 1 mg via INTRAVENOUS
  Filled 2017-03-19 (×3): qty 1

## 2017-03-19 NOTE — Progress Notes (Signed)
BiPAP order remains PRN, no machine in room, RT to monitor.

## 2017-03-19 NOTE — Progress Notes (Signed)
Security is  called to the Pt's room, he is noted to be  combative and hitting at the nursing staff, his breathing appears to be labored. He is placed in the  hospital bed to help facilitate beathing . He continues to try to get oob and is pulling at IV. Order given for IV Ativan and restraints for safety. I will report off to the day shift  Rn who will re zoom care.

## 2017-03-19 NOTE — Progress Notes (Signed)
PT Cancellation Note  Patient Details Name: Fritz PickerelKenneth Guzzo MRN: [id number] DOB: 09-03-1945   Cancelled Treatment:     PT deferred this date.  RN advises pt has received Ativan and is in restraints.  Will follow.   Stashia Sia 03/19/2017, 12:27 PM

## 2017-03-19 NOTE — Progress Notes (Signed)
Patient states "I want to end it."  When RN asked what the patient meant by that statement the patient stated "I want to kill myself but first I want to shoot you bitch."  Patient previously administered IV Ativan significantly confused at this time.  Will continue to monitor.

## 2017-03-19 NOTE — Progress Notes (Signed)
  Late entry, Pt is restless and appears to be upset with nursing staff. States that  he wants to be left alone, he is having diffculty sleeping. PA paged for sleeping meds. Refuses to sleep in his bed, he only once to sleep in his recliners, he is a high fall risk and  continues to try and get oob to use the urinal. The writer has continued to remind the pt to call for help with bathroom assistance, but continues to sporadically get oob  when he feels the urge to urinate.  Bed alarm on for safety  I will continue to monitor.

## 2017-03-19 NOTE — Progress Notes (Signed)
PULMONARY / CRITICAL CARE MEDICINE   Name: Randy PickerelKenneth Burgess MRN: [id number] DOB: 1945-05-30    ADMISSION DATE:  03/15/2017 CONSULTATION DATE:  03/15/2016  REFERRING MD:  Despina HickAlusio  CHIEF COMPLAINT:  Confusion, hyponatremia  HISTORY OF PRESENT ILLNESS:   72 y/o male underwent an elective R total hip replacement on 2/13 was brought to the ICU confused, in respiratory failure on BIPAP post operatively.  He was found to have hyponatremia on admission to the ICU.  He has an extensive smoking history.    SUBJECTIVE:  More delirious today.  Asking for a cigarette and a shot of alcohol   VITAL SIGNS: BP (!) 148/76   Pulse (!) 101   Temp 98.1 F (36.7 C) (Oral)   Resp (!) 28   Ht 5\' 11"  (1.803 m) Comment: Simultaneous filing. User may not have seen previous data.  Wt 179 lb (81.2 kg) Comment: Simultaneous filing. User may not have seen previous data.  SpO2 95%   BMI 24.97 kg/m   HEMODYNAMICS:    VENTILATOR SETTINGS:    INTAKE / OUTPUT: I/O last 3 completed shifts: In: 2494.7 [P.O.:360; I.V.:1511.7; Blood:623] Out: 850 [Urine:850]  PHYSICAL EXAMINATION: Blood pressure (!) 148/76, pulse (!) 101, temperature 98.1 F (36.7 C), temperature source Oral, resp. rate (!) 28, height 5\' 11"  (1.803 m), weight 179 lb (81.2 kg), SpO2 95 %. Gen:      No acute distress HEENT:  EOMI, sclera anicteric Neck:     No masses; no thyromegaly Lungs:    Coarse bilateral rhonchi. CV:         Regular rate and rhythm; no murmurs Abd:      + bowel sounds; soft, non-tender; no palpable masses, no distension Ext:    No edema; adequate peripheral perfusion Skin:      Warm and dry; no rash Neuro: Delirious, no focal deficits  LABS:  BMET Recent Labs  Lab 03/16/17 0308 03/17/17 0301 03/18/17 0322  NA 116* 120* 121*  K 3.9 4.4 4.4  CL 83* 90* 91*  CO2 23 20* 19*  BUN 9 15 10   CREATININE 0.59* 0.64 0.66  GLUCOSE 100* 98 132*    Electrolytes Recent Labs  Lab 03/16/17 0308 03/17/17 0301  03/18/17 0322  CALCIUM 8.8* 8.7* 8.9    CBC Recent Labs  Lab 03/16/17 0308 03/17/17 0301 03/18/17 0322  WBC 17.6* 18.6* 20.8*  HGB 9.7* 7.6* 9.2*  HCT 25.7* 19.8* 24.3*  PLT 188 165 144*    Coag's No results for input(s): APTT, INR in the last 168 hours.  Sepsis Markers No results for input(s): LATICACIDVEN, PROCALCITON, O2SATVEN in the last 168 hours.  ABG Recent Labs  Lab 03/15/17 1536 03/15/17 2058 03/19/17 0645  PHART 7.380 7.503* 7.427  PCO2ART 46.8 31.7* 33.1  PO2ART 92.2 75.3* 51.7*    Liver Enzymes No results for input(s): AST, ALT, ALKPHOS, BILITOT, ALBUMIN in the last 168 hours.  Cardiac Enzymes No results for input(s): TROPONINI, PROBNP in the last 168 hours.  Glucose Recent Labs  Lab 03/18/17 0733  GLUCAP 169*    Imaging Chest x-ray 03/18/17-hemidiaphragm elevation.  Bibasilar atelectasis.?  Mediastinal adenopathy I have reviewed the images personally.  STUDIES:    CULTURES:   ANTIBIOTICS:   SIGNIFICANT EVENTS:   LINES/TUBES:   DISCUSSION: [age] y/o male with confusion, cough, mild respiratory distress after an elective R hip replacement.  Noted to be hyponatremic. Has become more delirious over 24 hours.  Suspect he is going to alcohol withdrawal.  ASSESSMENT /  PLAN:  PULMONARY A: Cough, chest congestion Smoker, presume COPD ?  Mediastinal adenopathy, lung cancer P:   Continue Mucinex, incentive spirometry, flutter wall Duo nebs Likely has COPD.  Will need outpatient PFTs Will need CT chest with contrast to assess mediastinum when more stable.    CARDIOVASCULAR A:  Sinus tachycardia, hemodynamically stable Hypertension > worse 2/15 P:  Tele Hold HCTZ Continue amlodipine and benazepril  RENAL A:   Hyponatremia due to SIADH.  Sodium is slowly improving. Need to rule out lung malignancy P:   Holding hydrochlorothiazide.  Do not restart on discharge Free water restriction.   GASTROINTESTINAL A:   GERD P:    Pepcid twice daily Regular diet  HEMATOLOGIC A:   Anemia, no bleeding At risk for DVT P:  DVT prophylaxis per orthopeidcs  INFECTIOUS A:   WBC count higher today.  No clear evidence of infection Surgical site is clean.  P:   Blood culture if he spikes. Okay to observe off antibiotics  ENDOCRINE A:   No acute issues P:   Monitor glucose  NEUROLOGIC A:   Acute encephalopathy More delirious today.  Suspect alcohol withdrawal. Pt reports drinking everyday Post op pain P:   Oxycodone for pain. Start CIWA protocol Thiamine, folate Precedex if needed.  FAMILY  - Updates: Patient updated at bedside  Chilton Greathouse MD [facility] Pager [phone] If no answer or after 3pm call: [phone] 03/19/2017, 7:50 AM

## 2017-03-19 NOTE — Progress Notes (Addendum)
The Pt continues to try to get oob,  high fall risk has pulled out two IV"s, is a difficult stick, IV team paged to place IV, is continuing to refuse to go back to bed, chair alarm in place, sob placed on 5l [place] refuses to where 02 probe. Wants to leave AMA. I will continue to monitor and assess the  Pt.

## 2017-03-19 NOTE — Progress Notes (Signed)
Subjective: 4 Days Post-Op Procedure(s) (LRB): RIGHT TOTAL HIP ARTHROPLASTY ANTERIOR APPROACH (Right) Patient sedated  Objective: Vital signs in last 24 hours: Temp:  [97.8 F (36.6 C)-98.4 F (36.9 C)] 98.1 F (36.7 C) (02/17 0020) Pulse Rate:  [101-117] 101 (02/17 0645) Resp:  [18-28] 28 (02/17 0700) BP: (113-155)/(68-94) 148/76 (02/17 0700) SpO2:  [92 %-100 %] 95 % (02/17 0645)  Intake/Output from previous day: 02/16 0701 - 02/17 0700 In: 900 [I.V.:900] Out: 150 [Urine:150] Intake/Output this shift: No intake/output data recorded.  Recent Labs    03/17/17 0301 03/18/17 0322 03/19/17 0758  HGB 7.6* 9.2* 8.6*   Recent Labs    03/18/17 0322 03/19/17 0758  WBC 20.8* 29.6*  RBC 2.91* 2.76*  HCT 24.3* 23.3*  PLT 144* 151   Recent Labs    03/17/17 0301 03/18/17 0322  NA 120* 121*  K 4.4 4.4  CL 90* 91*  CO2 20* 19*  BUN 15 10  CREATININE 0.64 0.66  GLUCOSE 98 132*  CALCIUM 8.7* 8.9   No results for input(s): LABPT, INR in the last 72 hours.  Neurologically intact ABD soft Neurovascular intact Intact pulses distally Incision: dressing C/D/I and no drainage No cellulitis present Compartment soft no sign  of DVT  Assessment/Plan: 4 Days Post-Op Procedure(s) (LRB): RIGHT TOTAL HIP ARTHROPLASTY ANTERIOR APPROACH (Right) Up with therapy  Awaiting chest CT to r/o mass once more stable Pulm/critical care following Likely ETOH withdrawal Seen by myself and Dr. Shelle IronBeane this AM.  Christene LyeBISSELL, Lockie ParesJACLYN M. 03/19/2017, 8:37 AM

## 2017-03-20 ENCOUNTER — Inpatient Hospital Stay (HOSPITAL_COMMUNITY): Payer: Medicare (Managed Care)

## 2017-03-20 DIAGNOSIS — J189 Pneumonia, unspecified organism: Secondary | ICD-10-CM

## 2017-03-20 DIAGNOSIS — J9601 Acute respiratory failure with hypoxia: Secondary | ICD-10-CM

## 2017-03-20 LAB — GLUCOSE, CAPILLARY: Glucose-Capillary: 137 mg/dL — ABNORMAL HIGH (ref 65–99)

## 2017-03-20 LAB — CBC WITH DIFFERENTIAL/PLATELET
BASOS PCT: 0 %
Basophils Absolute: 0 10*3/uL (ref 0.0–0.1)
EOS PCT: 0 %
Eosinophils Absolute: 0 10*3/uL (ref 0.0–0.7)
HEMATOCRIT: 23.3 % — AB (ref 39.0–52.0)
HEMOGLOBIN: 8.6 g/dL — AB (ref 13.0–17.0)
Lymphocytes Relative: 3 %
Lymphs Abs: 0.9 10*3/uL (ref 0.7–4.0)
MCH: 31.2 pg (ref 26.0–34.0)
MCHC: 36.9 g/dL — AB (ref 30.0–36.0)
MCV: 84.4 fL (ref 78.0–100.0)
MONO ABS: 2.7 10*3/uL — AB (ref 0.1–1.0)
MONOS PCT: 9 %
Neutro Abs: 26 10*3/uL — ABNORMAL HIGH (ref 1.7–7.7)
Neutrophils Relative %: 88 %
Platelets: 151 10*3/uL (ref 150–400)
RBC: 2.76 MIL/uL — ABNORMAL LOW (ref 4.22–5.81)
RDW: 13.2 % (ref 11.5–15.5)
WBC: 29.6 10*3/uL — ABNORMAL HIGH (ref 4.0–10.5)

## 2017-03-20 LAB — BLOOD GAS, ARTERIAL
ACID-BASE DEFICIT: 1.1 mmol/L (ref 0.0–2.0)
Acid-Base Excess: 0.3 mmol/L (ref 0.0–2.0)
BICARBONATE: 26.8 mmol/L (ref 20.0–28.0)
Bicarbonate: 26.1 mmol/L (ref 20.0–28.0)
DELIVERY SYSTEMS: POSITIVE
DELIVERY SYSTEMS: POSITIVE
Drawn by: 11249
Drawn by: 441261
FIO2: 50
FIO2: 50
LHR: 16 {breaths}/min
Mode: POSITIVE
Mode: POSITIVE
O2 SAT: 92.4 %
O2 SAT: 95.5 %
PATIENT TEMPERATURE: 97.7
PEEP/CPAP: 5 cmH2O
PEEP/CPAP: 5 cmH2O
PH ART: 7.239 — AB (ref 7.350–7.450)
PO2 ART: 75.5 mmHg — AB (ref 83.0–108.0)
PO2 ART: 83.6 mmHg (ref 83.0–108.0)
PRESSURE CONTROL: 15 cmH2O
PRESSURE SUPPORT: 10 cmH2O
Patient temperature: 98.6
RATE: 16 resp/min
pCO2 arterial: 64.4 mmHg — ABNORMAL HIGH (ref 32.0–48.0)
pCO2 arterial: 51.4 mmHg — ABNORMAL HIGH (ref 32.0–48.0)
pH, Arterial: 7.326 — ABNORMAL LOW (ref 7.350–7.450)

## 2017-03-20 LAB — BASIC METABOLIC PANEL
ANION GAP: 12 (ref 5–15)
Anion gap: 11 (ref 5–15)
BUN: 8 mg/dL (ref 6–20)
BUN: 10 mg/dL (ref 6–20)
CALCIUM: 9.1 mg/dL (ref 8.9–10.3)
CALCIUM: 9.1 mg/dL (ref 8.9–10.3)
CHLORIDE: 93 mmol/L — AB (ref 101–111)
CO2: 24 mmol/L (ref 22–32)
CO2: 23 mmol/L (ref 22–32)
CREATININE: 0.57 mg/dL — AB (ref 0.61–1.24)
Chloride: 93 mmol/L — ABNORMAL LOW (ref 101–111)
Creatinine, Ser: 0.49 mg/dL — ABNORMAL LOW (ref 0.61–1.24)
GFR calc Af Amer: >60 mL/min (ref >60)
GFR calc non Af Amer: >60 mL/min (ref >60)
GLUCOSE: 121 mg/dL — AB (ref 65–99)
Glucose, Bld: 89 mg/dL (ref 65–99)
Potassium: 4.3 mmol/L (ref 3.5–5.1)
Potassium: 4.4 mmol/L (ref 3.5–5.1)
SODIUM: 128 mmol/L — AB (ref 135–145)
Sodium: 128 mmol/L — ABNORMAL LOW (ref 135–145)

## 2017-03-20 LAB — CBC
HEMATOCRIT: 26.1 % — AB (ref 39.0–52.0)
HEMOGLOBIN: 9.5 g/dL — AB (ref 13.0–17.0)
MCH: 31.1 pg (ref 26.0–34.0)
MCHC: 36.4 g/dL — AB (ref 30.0–36.0)
MCV: 85.6 fL (ref 78.0–100.0)
Platelets: 207 10*3/uL (ref 150–400)
RBC: 3.05 MIL/uL — ABNORMAL LOW (ref 4.22–5.81)
RDW: 13.3 % (ref 11.5–15.5)
WBC: 30.3 10*3/uL — ABNORMAL HIGH (ref 4.0–10.5)

## 2017-03-20 LAB — MAGNESIUM: Magnesium: 1.3 mg/dL — ABNORMAL LOW (ref 1.7–2.4)

## 2017-03-20 LAB — PHOSPHORUS: PHOSPHORUS: 3.3 mg/dL (ref 2.5–4.6)

## 2017-03-20 MED ORDER — SODIUM CHLORIDE 0.9 % IV SOLN
1.0000 g | Freq: Three times a day (TID) | INTRAVENOUS | Status: DC
  Administered 2017-03-20 – 2017-03-21 (×3): 1 g via INTRAVENOUS
  Filled 2017-03-20 (×5): qty 1

## 2017-03-20 MED ORDER — VANCOMYCIN HCL 10 G IV SOLR
1500.0000 mg | Freq: Once | INTRAVENOUS | Status: AC
  Administered 2017-03-20: 1500 mg via INTRAVENOUS
  Filled 2017-03-20: qty 1500

## 2017-03-20 MED ORDER — DEXMEDETOMIDINE HCL IN NACL 200 MCG/50ML IV SOLN: 0.4000 ug/kg/h | INTRAVENOUS | Status: DC

## 2017-03-20 MED ORDER — LABETALOL HCL 5 MG/ML IV SOLN
10.0000 mg | INTRAVENOUS | Status: DC | PRN
  Administered 2017-03-20 (×2): 10 mg via INTRAVENOUS
  Filled 2017-03-20 (×3): qty 4

## 2017-03-20 MED ORDER — HALOPERIDOL LACTATE 5 MG/ML IJ SOLN
1.0000 mg | INTRAMUSCULAR | Status: DC | PRN
  Filled 2017-03-20 (×2): qty 1

## 2017-03-20 MED ORDER — VANCOMYCIN HCL IN DEXTROSE 1-5 GM/200ML-% IV SOLN
1000.0000 mg | Freq: Two times a day (BID) | INTRAVENOUS | Status: DC
  Administered 2017-03-21: 1000 mg via INTRAVENOUS
  Filled 2017-03-20: qty 200

## 2017-03-20 NOTE — Progress Notes (Signed)
Pharmacy Antibiotic Note  Randy Burgess is a 72 y.o. male admitted on 03/15/2017 with HCAP.  Pharmacy has been consulted for vancomycin and cefepime dosing. Pt with increased WBC and infiltrate in left base.   Plan:  Cefepime 1 gr IV q8h    Vancomycin 1500 mg IV x1, then vancomycin 1000 mg IV q12h  Monitor clinical course, renal function, cultures as available   Height: 5\' 11"  (180.3 cm)(Simultaneous filing. User may not have seen previous data.) Weight: 179 lb (81.2 kg)(Simultaneous filing. User may not have seen previous data.) IBW/kg (Calculated) : 75.3  Temp (24hrs), Avg:97.9 F (36.6 C), Min:96.7 F (35.9 C), Max:98.4 F (36.9 C)  Recent Labs  Lab 03/16/17 0308 03/17/17 0301 03/18/17 0322 03/19/17 0758 03/20/17 0345 03/20/17 1311  WBC 17.6* 18.6* 20.8* 29.6* 30.3*  --   CREATININE 0.59* 0.64 0.66 0.68 0.57* 0.49*    Estimated Creatinine Clearance: 91.5 mL/min (A) (by C-G formula based on SCr of 0.49 mg/dL (L)).    Allergies  Allergen Reactions  . Penicillins     Childhood allergy Has patient had a PCN reaction causing immediate rash, facial/tongue/throat swelling, SOB or lightheadedness with hypotension: Unknown Has patient had a PCN reaction causing severe rash involving mucus membranes or skin necrosis: Unknown Has patient had a PCN reaction that required hospitalization: Unknown Has patient had a PCN reaction occurring within the last 10 years: No If all of the above answers are "NO", then may proceed with Cephalosporin use.   . Sulfa Antibiotics     Unknown reactions     Antimicrobials this admission: 2/18 vancomycin >>  2/18 cefepime >>   Dose adjustments this admission: ---  Microbiology results: 2/18 BCx: sent 2/14 MRSA PCR: negative  Thank you for allowing pharmacy to be a part of this patient's care.  Adalberto ColeNikola Blondell Laperle, PharmD, BCPS Pager [phone] 03/20/2017 2:42 PM

## 2017-03-20 NOTE — Progress Notes (Signed)
Curently on vent. Will continue to monitor for progress.

## 2017-03-20 NOTE — Progress Notes (Signed)
[facility]  Came back to evaluate Mr. Forbess; he is more awake and alert, albeit angry.  Moving air, oxygenating OK  conversant with me but still need re-direction  Plan: Use haldol prn agitation precedex if haldol not effective Stop BIPAP to help reduce agitation, monitor respiratory status closely  Heber CarolinaBrent Omran Keelin, MD [facility] Pager: [phone] Cell: [phone] After 3pm or if no response, call [phone]

## 2017-03-20 NOTE — Progress Notes (Signed)
eLink Physician-Brief Progress Note Patient Name: Randy PickerelKenneth Burgess DOB: Nov 26, 1945 MRN: [id number]   Date of Service  03/20/2017  HPI/Events of Note  Multiple issues: 1. Increasing lethargy - ABG = 7.232/66/78/26. Patient placed on BiPAP and 2 HtN - BP = 191/85 and HR = 99.  eICU Interventions  Will order: 1. Repeat ABG at 8 AM.  2. Labetalol 10 mg IV Q 10 minutes PRN SBP   > 170.     Intervention Category Major Interventions: Hypertension - evaluation and management;Change in mental status - evaluation and management  Jalisia Puchalski Dennard Nipugene 03/20/2017, 6:41 AM

## 2017-03-20 NOTE — Progress Notes (Signed)
PT Cancellation Note  Patient Details Name: Randy PickerelKenneth Burgess MRN: [id number] DOB: 12-Feb-1945   Cancelled Treatment:    Reason Eval/Treat Not Completed: Medical issues which prohibited therapy, increased lethargy.   Rada HayHill, Nazareth Norenberg Elizabeth 03/20/2017, 7:14 AM  Blanchard KelchKaren Jere Vanburen PT [phone]

## 2017-03-20 NOTE — Progress Notes (Signed)
PULMONARY / CRITICAL CARE MEDICINE   Name: Randy Burgess MRN: [id number] DOB: 06-Jun-1945    ADMISSION DATE:  03/15/2017 CONSULTATION DATE:  03/15/2017  REFERRING MD:  Despina Hick  CHIEF COMPLAINT:  Confusion, hyponatremia  HISTORY OF PRESENT ILLNESS:   72 y/o male underwent an elective R total hip replacement on 2/13 was brought to the ICU confused, in respiratory failure on BIPAP post operatively.  He was found to have hyponatremia on admission to the ICU.  He has an extensive smoking history.     PAST MEDICAL HISTORY :  He  has a past medical history of DDD (degenerative disc disease), lumbar, Essential hypertension, Frequent headaches, and Osteoarthritis of both hips.  SUBJECTIVE:  Worsening deilirium overnight Started on CIWA protocol, given Ativan  VITAL SIGNS: BP (!) 144/97 (BP Location: Right Arm)   Pulse 82   Temp (!) 96.7 F (35.9 C) (Axillary)   Resp (!) 25   Ht 5\' 11"  (1.803 m) Comment: Simultaneous filing. User may not have seen previous data.  Wt 179 lb (81.2 kg) Comment: Simultaneous filing. User may not have seen previous data.  SpO2 100%   BMI 24.97 kg/m   HEMODYNAMICS:    VENTILATOR SETTINGS: Vent Mode: BIPAP FiO2 (%):  [40 %-50 %] 40 % Set Rate:  [16 bmp] 16 bmp PEEP:  [5 cmH20] 5 cmH20  INTAKE / OUTPUT: I/O last 3 completed shifts: In: 1750 [I.V.:1750] Out: 2650 [Urine:2650]  PHYSICAL EXAMINATION:  General:  Resting in bed HENT: NCAT OP clear PULM: CTA, on BIPAP CV: RRR, no mgr GI: BS+, soft, nontender MSK: normal bulk and tone Neuro: somnolent in bed, opens eyes and moves to voice for me  LABS:  BMET Recent Labs  Lab 03/18/17 0322 03/19/17 0758 03/20/17 0345  NA 121* 120* 128*  K 4.4 4.9 4.3  CL 91* 91* 93*  CO2 19* 21* 24  BUN 10 12 8   CREATININE 0.66 0.68 0.57*  GLUCOSE 132* 104* 121*    Electrolytes Recent Labs  Lab 03/18/17 0322 03/19/17 0758 03/20/17 0345  CALCIUM 8.9 9.0 9.1  MG  --   --  1.3*  PHOS  --   --  3.3     CBC Recent Labs  Lab 03/18/17 0322 03/19/17 0758 03/20/17 0345  WBC 20.8* 29.6* 30.3*  HGB 9.2* 8.6* 9.5*  HCT 24.3* 23.3* 26.1*  PLT 144* 151 207    Coag's No results for input(s): APTT, INR in the last 168 hours.  Sepsis Markers No results for input(s): LATICACIDVEN, PROCALCITON, O2SATVEN in the last 168 hours.  ABG Recent Labs  Lab 03/19/17 0645 03/20/17 0620 03/20/17 0740  PHART 7.427 7.239* 7.326*  PCO2ART 33.1 64.4* 51.4*  PO2ART 51.7* 75.5* 83.6    Liver Enzymes Recent Labs  Lab 03/19/17 0758  AST 64*  ALT 35  ALKPHOS 75  BILITOT 0.9  ALBUMIN 3.4*    Cardiac Enzymes No results for input(s): TROPONINI, PROBNP in the last 168 hours.  Glucose Recent Labs  Lab 03/18/17 0733 03/20/17 0621  GLUCAP 169* 137*    Imaging Dg Chest Port 1 View  Result Date: 03/20/2017 CLINICAL DATA:  Respiratory failure EXAM: PORTABLE CHEST 1 VIEW COMPARISON:  March 18, 2017. FINDINGS: There is a small left pleural effusion with patchy bibasilar atelectasis. There is mild consolidation in the medial left base. Heart is upper normal in size with pulmonary vascularity within normal limits. There is aortic atherosclerosis. No adenopathy. No bone lesions. IMPRESSION: Bibasilar atelectasis with small pleural effusions. Focal  consolidation medial left base, new from most recent study and likely representing focal pneumonia. Stable cardiac silhouette.  There is aortic atherosclerosis. Aortic Atherosclerosis (ICD10-I70.0). Electronically Signed   By: Bretta BangWilliam  Woodruff III M.D.   On: 03/20/2017 07:02     STUDIES:    CULTURES:   ANTIBIOTICS:   SIGNIFICANT EVENTS: 2/13 R hip replacement  LINES/TUBES: 2/17 BIPAP  DISCUSSION: 72 y/o male with a history of EtOH and tobacco abuse here with post operative encephalopathy and hyponatremia after an elective R hip replacement.  ASSESSMENT / PLAN:  PULMONARY A: COPD Acute hypercarbic respiratory failure> presumably due  to ativan given 2/18 R upper lobe mass? P:   Hold ativan Hold narcotics Continue BIPAP for now, try to wean off May need to intubate if mental status doesn't improve Needs CT chest  CARDIOVASCULAR A:  Hypertension P:  Tele Benazepril Amlodipine  RENAL A:   SIADH > improving, increased 8 mEq/dL last 24 hours P:   Repeat BMET again today to ensure that rise in sodium appropriate Monitor BMET and UOP Replace electrolytes as needed  GASTROINTESTINAL A:   No acute issues P:   NPO while on BIPAP  HEMATOLOGIC A:   Anemia, no bleeding P:  Monitor for bleeding  INFECTIOUS A:   WBC up infiltrate L base, HCAP P:   Start vanc/zosyn Send blood cultures  ENDOCRINE A:   No acute issues   P:   Monitor glucose  NEUROLOGIC A:   Acute encephalopathy 2/18> ICU delirium complicated by HCAP and ativan use Prior EtOH use, but Attorney in fact (neighbor) says that he hasn't had EtOH in over a month S/P Total Hip replacement P:   Stop CIWA Stop narcotics Use APAP prn   FAMILY  - Updates:  Spoke with [name] (friend, attorney in fact for healthcare affairs)  - Inter-disciplinary family meet or Palliative Care meeting due by:  day 7  CC time 40 minutes  Heber CarolinaBrent Merrisa Skorupski, MD [facility] Pager: [phone] Cell: [phone] After 3pm or if no response, call [phone]    03/20/2017, 10:52 AM

## 2017-03-20 NOTE — Progress Notes (Signed)
   Subjective: 5 Days Post-Op Procedure(s) (LRB): RIGHT TOTAL HIP ARTHROPLASTY ANTERIOR APPROACH (Right) Patient sedated and not responsive to verbal commands.  Will react to stimuli. Patient seen in rounds for Dr. Lequita HaltAluisio. Getting another blood gas at time of rounds Plan was initially to go Home after hospital stay.  Will see how he recovers.  Objective: Vital signs in last 24 hours: Temp:  [96.7 F (35.9 C)-98.3 F (36.8 C)] 96.7 F (35.9 C) (02/18 0800) Pulse Rate:  [29-111] 82 (02/18 0800) Resp:  [20-30] 25 (02/18 0800) BP: (144-191)/(69-105) 144/97 (02/18 0800) SpO2:  [80 %-99 %] 99 % (02/18 0800) FiO2 (%):  [50 %] 50 % (02/18 0642)  Intake/Output from previous day:  Intake/Output Summary (Last 24 hours) at 03/20/2017 0941 Last data filed at 03/20/2017 0900 Gross per 24 hour  Intake 1200 ml  Output 2500 ml  Net -1300 ml    Intake/Output this shift: Total I/O In: 150 [I.V.:150] Out: -   Labs: Recent Labs    03/18/17 0322 03/19/17 0758 03/20/17 0345  HGB 9.2* 8.6* 9.5*   Recent Labs    03/19/17 0758 03/20/17 0345  WBC 29.6* 30.3*  RBC 2.76* 3.05*  HCT 23.3* 26.1*  PLT 151 207   Recent Labs    03/19/17 0758 03/20/17 0345  NA 120* 128*  K 4.9 4.3  CL 91* 93*  CO2 21* 24  BUN 12 8  CREATININE 0.68 0.57*  GLUCOSE 104* 121*  CALCIUM 9.0 9.1   No results for input(s): LABPT, INR in the last 72 hours.  EXAM General - Patient is obtunded, not responsive to verbal, but some response to stimuli Extremity - moving feet away from distal stiumli, pulse are intact bilaterally. Inicision - no drainage Motor Function - moving foot and toes in response to stimuli  Past Medical History:  Diagnosis Date  . DDD (degenerative disc disease), lumbar   . Essential hypertension    + LVH on EKG 02/01/17.  Started hctz 25mg  qd 02/01/17.  . Frequent headaches   . 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 Lequita HaltAluisio    Assessment/Plan: 5 Days Post-Op Procedure(s) (LRB): RIGHT TOTAL HIP ARTHROPLASTY ANTERIOR APPROACH (Right) Principal Problem:   OA (osteoarthritis) of hip Active Problems:   H/O total hip arthroplasty  Estimated body mass index is 24.97 kg/m as calculated from the following:   Height as of this encounter: 5\' 11"  (1.803 m).   Weight as of this encounter: 81.2 kg (179 lb).  Increasing lethargy.  Getting blood gas at time of rounds.  DVT Prophylaxis - Xarelto Weight Bearing As Tolerated right Leg   Randy Peacerew Perkins, PA-C Orthopaedic Surgery 03/20/2017, 9:41 AM

## 2017-03-21 ENCOUNTER — Inpatient Hospital Stay (HOSPITAL_COMMUNITY): Payer: Medicare (Managed Care)

## 2017-03-21 DIAGNOSIS — J810 Acute pulmonary edema: Secondary | ICD-10-CM

## 2017-03-21 DIAGNOSIS — M7989 Other specified soft tissue disorders: Secondary | ICD-10-CM

## 2017-03-21 LAB — BRAIN NATRIURETIC PEPTIDE: B NATRIURETIC PEPTIDE 5: 126.7 pg/mL — AB (ref 0.0–100.0)

## 2017-03-21 LAB — BASIC METABOLIC PANEL
ANION GAP: 14 (ref 5–15)
BUN: 9 mg/dL (ref 6–20)
CO2: 23 mmol/L (ref 22–32)
Calcium: 9.1 mg/dL (ref 8.9–10.3)
Chloride: 95 mmol/L — ABNORMAL LOW (ref 101–111)
Creatinine, Ser: 0.56 mg/dL — ABNORMAL LOW (ref 0.61–1.24)
GFR calc Af Amer: >60 mL/min (ref >60)
Glucose, Bld: 100 mg/dL — ABNORMAL HIGH (ref 65–99)
POTASSIUM: 3.9 mmol/L (ref 3.5–5.1)
SODIUM: 132 mmol/L — AB (ref 135–145)

## 2017-03-21 LAB — BLOOD CULTURE ID PANEL (REFLEXED)
ACINETOBACTER BAUMANNII: NOT DETECTED
CANDIDA ALBICANS: NOT DETECTED
Candida glabrata: NOT DETECTED
Candida krusei: NOT DETECTED
Candida parapsilosis: NOT DETECTED
Candida tropicalis: NOT DETECTED
Carbapenem resistance: NOT DETECTED
ENTEROBACTER CLOACAE COMPLEX: NOT DETECTED
ENTEROBACTERIACEAE SPECIES: NOT DETECTED
Enterococcus species: NOT DETECTED
Escherichia coli: NOT DETECTED
HAEMOPHILUS INFLUENZAE: NOT DETECTED
Klebsiella oxytoca: NOT DETECTED
Klebsiella pneumoniae: NOT DETECTED
Listeria monocytogenes: NOT DETECTED
NEISSERIA MENINGITIDIS: NOT DETECTED
PSEUDOMONAS AERUGINOSA: DETECTED — AB
Proteus species: NOT DETECTED
STREPTOCOCCUS AGALACTIAE: NOT DETECTED
STREPTOCOCCUS PYOGENES: NOT DETECTED
STREPTOCOCCUS SPECIES: NOT DETECTED
Serratia marcescens: NOT DETECTED
Staphylococcus aureus (BCID): NOT DETECTED
Staphylococcus species: NOT DETECTED
Streptococcus pneumoniae: NOT DETECTED

## 2017-03-21 MED ORDER — KETOROLAC TROMETHAMINE 30 MG/ML IJ SOLN
30.0000 mg | Freq: Once | INTRAMUSCULAR | Status: AC
  Administered 2017-03-21: 30 mg via INTRAVENOUS
  Filled 2017-03-21: qty 1

## 2017-03-21 MED ORDER — IBUPROFEN 200 MG PO TABS
400.0000 mg | ORAL_TABLET | Freq: Four times a day (QID) | ORAL | Status: DC | PRN
Start: 2017-03-21 — End: 2017-03-24
  Administered 2017-03-22 – 2017-03-23 (×5): 400 mg via ORAL
  Filled 2017-03-21 (×5): qty 2

## 2017-03-21 MED ORDER — POTASSIUM CHLORIDE CRYS ER 20 MEQ PO TBCR: 40.0000 meq | EXTENDED_RELEASE_TABLET | Freq: Once | ORAL | Status: DC

## 2017-03-21 MED ORDER — ALBUTEROL SULFATE (2.5 MG/3ML) 0.083% IN NEBU: 2.5000 mg | INHALATION_SOLUTION | RESPIRATORY_TRACT | Status: DC | PRN

## 2017-03-21 MED ORDER — SODIUM CHLORIDE 3 % IN NEBU
4.0000 mL | INHALATION_SOLUTION | Freq: Two times a day (BID) | RESPIRATORY_TRACT | Status: AC
  Administered 2017-03-21 – 2017-03-22 (×4): 4 mL via RESPIRATORY_TRACT
  Filled 2017-03-21 (×4): qty 4

## 2017-03-21 MED ORDER — FUROSEMIDE 10 MG/ML IJ SOLN
40.0000 mg | Freq: Four times a day (QID) | INTRAMUSCULAR | Status: AC
  Administered 2017-03-21 (×2): 40 mg via INTRAVENOUS
  Filled 2017-03-21 (×2): qty 4

## 2017-03-21 MED ORDER — CEFEPIME HCL 2 G IJ SOLR
2.0000 g | Freq: Three times a day (TID) | INTRAMUSCULAR | Status: DC
  Administered 2017-03-21 – 2017-03-29 (×24): 2 g via INTRAVENOUS
  Filled 2017-03-21 (×27): qty 2

## 2017-03-21 NOTE — Progress Notes (Signed)
Physical Therapy Treatment Patient Details Name: Randy Burgess MRN: [id number] DOB: [birth year] Today's Date: 03/21/2017    History of Present Illness Pt s/p R THR and with post op encephalopathy & respiratory failure    PT Comments    Pt on BiPAP. Mod assist for bed mobility, +2 min assist to transfer from bed to recliner, SaO2 87-94% with activity. RLE exercises with min assist.   Follow Up Recommendations  Follow surgeon's recommendation for DC plan and follow-up therapies(likely will need SNF)     Equipment Recommendations  Other (comment)    Recommendations for Other Services OT consult     Precautions / Restrictions Precautions Precautions: Fall Precaution Comments: monitor O2 sats Restrictions Weight Bearing Restrictions: Yes RLE Weight Bearing: Weight bearing as tolerated    Mobility  Bed Mobility Overal bed mobility: Needs Assistance Bed Mobility: Supine to Sit     Supine to sit: Mod assist;+2 for physical assistance;+2 for safety/equipment        Transfers Overall transfer level: Needs assistance Equipment used: Rolling walker (2 wheeled) Transfers: Sit to/from [employer] Sit to Stand: Min assist;+2 physical assistance Stand pivot transfers: +2 physical assistance;Min assist       General transfer comment: cues for safety, transition position and use of UEs to self assist; pt on bipap, SaO2 87-94% with activity  Ambulation/Gait                 Stairs            Wheelchair Mobility    Modified Rankin (Stroke Patients Only)       Balance Overall balance assessment: Needs assistance Sitting-balance support: No upper extremity supported;Feet supported Sitting balance-Leahy Scale: Good     Standing balance support: Bilateral upper extremity supported Standing balance-Leahy Scale: Poor                              Cognition Arousal/Alertness: Awake/alert Behavior During Therapy: WFL for tasks  assessed/performed Overall Cognitive Status: Difficult to assess                                        Exercises Total Joint Exercises Ankle Circles/Pumps: AROM;Both;15 reps;Supine Heel Slides: AAROM;Right;Supine;10 reps Hip ABduction/ADduction: AAROM;Right;Supine;10 reps Long Arc Quad: AROM;Right;10 reps;Seated    General Comments        Pertinent Vitals/Pain Pain Assessment: No/denies pain    Home Living                      Prior Function            PT Goals (current goals can now be found in the care plan section) Acute Rehab PT Goals Patient Stated Goal: Go home PT Goal Formulation: Patient unable to participate in goal setting Time For Goal Achievement: Apr 08, 2017 Potential to Achieve Goals: Good Progress towards PT goals: Progressing toward goals;Goals downgraded-see care plan    Frequency    7X/week      PT Plan Current plan remains appropriate    Co-evaluation              AM-PAC PT "6 Clicks" Daily Activity  Outcome Measure  Difficulty turning over in bed (including adjusting bedclothes, sheets and blankets)?: Unable Difficulty moving from lying on back to sitting on the side of the bed? : Unable Difficulty sitting down on  and standing up from a chair with arms (e.g., wheelchair, bedside commode, etc,.)?: Unable Help needed moving to and from a bed to chair (including a wheelchair)?: A Lot Help needed walking in hospital room?: Total Help needed climbing 3-5 steps with a railing? : Total 6 Click Score: 7    End of Session Equipment Utilized During Treatment: Gait belt(bipap) Activity Tolerance: Patient tolerated treatment well Patient left: in chair;with call bell/phone within reach;with chair alarm set;with nursing/sitter in room Nurse Communication: Mobility status PT Visit Diagnosis: Unsteadiness on feet (R26.81);Difficulty in walking, not elsewhere classified (R26.2)     Time: 1204-1229 PT Time Calculation  (min) (ACUTE ONLY): 25 min  Charges:  $Therapeutic Exercise: 8-22 mins $Therapeutic Activity: 8-22 mins                    G Codes:          Tamala SerUhlenberg, Evora Schechter Kistler 03/21/2017, 1:26 PM [phone]

## 2017-03-21 NOTE — Progress Notes (Signed)
RT called to assess patient by bedside RN. Patient desaturated while transferring to chair while on BiPAP. Patient was placed on 100% and given scheduled breathing treatment. O2 weaned to 60% FiO2 and )2 sats currently 98%. RT will continue to monitor patient.

## 2017-03-21 NOTE — Progress Notes (Signed)
*  Preliminary Results* Left upper extremity venous duplex completed. Left upper extremity is negative for deep and superficial vein thrombosis.  03/21/2017 3:33 PM  Gertie FeyMichelle Tatyana Biber, BS, RVT, RDCS, RDMS

## 2017-03-21 NOTE — Progress Notes (Signed)
   Subjective: 6 Days Post-Op Procedure(s) (LRB): RIGHT TOTAL HIP ARTHROPLASTY ANTERIOR APPROACH (Right) Patient more alert today when seen on morning rounds although not communicating well. Patient seen in rounds with Dr. Lequita HaltAluisio. Patient is well, but has had some minor complaints of pain in the hip.   Objective: Vital signs in last 24 hours: Temp:  [97.6 F (36.4 C)-99.1 F (37.3 C)] 97.6 F (36.4 C) (02/19 2051) Pulse Rate:  [82-146] 146 (02/19 1900) Resp:  [16-39] 23 (02/19 1900) BP: (105-183)/(58-109) 105/86 (02/19 1900) SpO2:  [83 %-100 %] 85 % (02/19 1900) FiO2 (%):  [40 %-100 %] 60 % (02/19 1244)  Intake/Output from previous day:  Intake/Output Summary (Last 24 hours) at 03/21/2017 2121 Last data filed at 03/21/2017 1800 Gross per 24 hour  Intake 1832.5 ml  Output 3550 ml  Net -1717.5 ml    Intake/Output this shift: No intake/output data recorded.  Labs: Recent Labs    03/19/17 0758 03/20/17 0345  HGB 8.6* 9.5*   Recent Labs    03/19/17 0758 03/20/17 0345  WBC 29.6* 30.3*  RBC 2.76* 3.05*  HCT 23.3* 26.1*  PLT 151 207   Recent Labs    03/20/17 1311 03/21/17 0328  NA 128* 132*  K 4.4 3.9  CL 93* 95*  CO2 23 23  BUN 10 9  CREATININE 0.49* 0.56*  GLUCOSE 89 100*  CALCIUM 9.1 9.1   No results for input(s): LABPT, INR in the last 72 hours.  EXAM General - Patient was more alert today on exam as compared to yesterday. Extremity - Neurovascular intact Sensation intact distally Intact pulses distally Dorsiflexion/Plantar flexion intact Incision - no drainage Motor Function - intact, moving foot and toes well on exam.   Past Medical History:  Diagnosis Date  . DDD (degenerative disc disease), lumbar   . Essential hypertension    + LVH on EKG 02/01/17.  Started hctz 25mg  qd 02/01/17.  . Frequent headaches   . 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 Lequita HaltAluisio     Assessment/Plan: 6 Days Post-Op Procedure(s) (LRB): RIGHT TOTAL HIP ARTHROPLASTY ANTERIOR APPROACH (Right) Principal Problem:   OA (osteoarthritis) of hip Active Problems:   H/O total hip arthroplasty  Estimated body mass index is 24.97 kg/m as calculated from the following:   Height as of this encounter: 5\' 11"  (1.803 m).   Weight as of this encounter: 81.2 kg (179 lb).  Plan was initially to go Home after hospital stay.  Will see how he recovers.   DVT Prophylaxis - Xarelto Weight Bearing As Tolerated right Leg   Avel Peacerew Mikisha Roseland, PA-C Orthopaedic Surgery 03/21/2017, 9:21 PM

## 2017-03-21 NOTE — Progress Notes (Signed)
PULMONARY / CRITICAL CARE MEDICINE   Name: Randy Burgess MRN: [id number] DOB: [birth year]    ADMISSION DATE:  03/15/2017 CONSULTATION DATE:  03/15/2017  REFERRING MD:  Despina Hick  CHIEF COMPLAINT:  Confusion, hyponatremia  HISTORY OF PRESENT ILLNESS:   72 y/o male underwent an elective R total hip replacement on 2/13 was brought to the ICU confused, in respiratory failure on BIPAP post operatively.  He was found to have hyponatremia on admission to the ICU.  He has an extensive smoking history.     PAST MEDICAL HISTORY :  He  has a past medical history of DDD (degenerative disc disease), lumbar, Essential hypertension, Frequent headaches, and Osteoarthritis of both hips.  SUBJECTIVE:  Hasn't required sedating medicines Off and on BIPAP for hypoxemia Difficult clearing secretions  VITAL SIGNS: BP (!) 155/76   Pulse 82   Temp 99.1 F (37.3 C) (Axillary)   Resp 16   Ht 5\' 11"  (1.803 m) Comment: Simultaneous filing. User may not have seen previous data.  Wt 179 lb (81.2 kg) Comment: Simultaneous filing. User may not have seen previous data.  SpO2 97%   BMI 24.97 kg/m   HEMODYNAMICS:    VENTILATOR SETTINGS: Vent Mode: CPAP FiO2 (%):  [40 %-50 %] 40 % Set Rate:  [16 bmp] 16 bmp PEEP:  [5 cmH20] 5 cmH20  INTAKE / OUTPUT: I/O last 3 completed shifts: In: 2395 [I.V.:1695; IV Piggyback:700] Out: 3200 [Urine:3200]  PHYSICAL EXAMINATION:  General:  Resting comfortably in bed on BIPAP HENT: NCAT OP clear PULM: Coarse crackles, rhonchi left base, normal effort CV: RRR, no mgr GI: BS+, soft, nontender MSK: normal bulk and tone Neuro: drowsy but arouses, oriented to date, follows commands, speech difficult to interpret, inattentive to conversation  LABS:  BMET Recent Labs  Lab 03/20/17 0345 03/20/17 1311 03/21/17 0328  NA 128* 128* 132*  K 4.3 4.4 3.9  CL 93* 93* 95*  CO2 24 23 23   BUN 8 10 9   CREATININE 0.57* 0.49* 0.56*  GLUCOSE 121* 89 100*     Electrolytes Recent Labs  Lab 03/20/17 0345 03/20/17 1311 03/21/17 0328  CALCIUM 9.1 9.1 9.1  MG 1.3*  --   --   PHOS 3.3  --   --     CBC Recent Labs  Lab 03/18/17 0322 03/19/17 0758 03/20/17 0345  WBC 20.8* 29.6* 30.3*  HGB 9.2* 8.6* 9.5*  HCT 24.3* 23.3* 26.1*  PLT 144* 151 207    Coag's No results for input(s): APTT, INR in the last 168 hours.  Sepsis Markers No results for input(s): LATICACIDVEN, PROCALCITON, O2SATVEN in the last 168 hours.  ABG Recent Labs  Lab 03/19/17 0645 03/20/17 0620 03/20/17 0740  PHART 7.427 7.239* 7.326*  PCO2ART 33.1 64.4* 51.4*  PO2ART 51.7* 75.5* 83.6    Liver Enzymes Recent Labs  Lab 03/19/17 0758  AST 64*  ALT 35  ALKPHOS 75  BILITOT 0.9  ALBUMIN 3.4*    Cardiac Enzymes No results for input(s): TROPONINI, PROBNP in the last 168 hours.  Glucose Recent Labs  Lab 03/18/17 0733 03/20/17 0621  GLUCAP 169* 137*    Imaging No results found.   STUDIES:    CULTURES: 2/18 blood >   ANTIBIOTICS: 2/18 cefepime >  2/18 vanc > 2/19  SIGNIFICANT EVENTS: 2/13 R hip replacement  LINES/TUBES: 2/17 BIPAP  DISCUSSION: 72 y/o male with a history of EtOH and tobacco abuse here with post operative encephalopathy and hyponatremia after an elective R hip replacement. As of  2/18 he has HCAP.   ASSESSMENT / PLAN:  PULMONARY A: COPD Acute hypercarbic respiratory failure> improved Acute hypoxemic respiratory failure > persistent 2/19, requiring BIPAP intermittently, suspect volume overload (acute pulmonary edema) R upper lobe mass? 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 P:   Monitor BMET and UOP Replace electrolytes as  needed  GASTROINTESTINAL A:   No acute issues P:   NPO while on BIPAP  HEMATOLOGIC A:   Anemia, no bleeding Left arm swelling today P:  Monitor for bleeding Left arm ultrasound> evaluate for DVT Continue rivaroxiban for DVT prevention  INFECTIOUS A:   HCAP P:   Continue cefepime Stop vanc F/u cultures  ENDOCRINE A:   No acute issues   P:   Monitor glucose  NEUROLOGIC A:   Acute encephalopathy 2/18> ICU delirium complicated by HCAP and ativan use Prior EtOH use, but Attorney in fact Biomedical engineer(neighbor) says that he hasn't had EtOH in over a month S/P Total Hip replacement P:   Don't use benzo If severe agitation> haldol, if no improvement then precedex PT consult Frequent orientation, out of bed   FAMILY  - Updates:  Spoke with [name] (friend, attorney in fact for healthcare affairs) again on 2/19  - Inter-disciplinary family meet or Palliative Care meeting due by:  day 7  CC time 32 minutes  Heber CarolinaBrent McQuaid, MD [place] [facility] Pager: [phone] Cell: [phone] After 3pm or if no response, call [phone]    03/21/2017, 10:09 AM

## 2017-03-21 NOTE — Progress Notes (Signed)
PHARMACY - PHYSICIAN COMMUNICATION CRITICAL VALUE ALERT - BLOOD CULTURE IDENTIFICATION (BCID)  Results for orders placed or performed during the hospital encounter of 03/15/17  Blood Culture ID Panel (Reflexed) (Collected: 03/20/2017 12:58 PM)  Result Value Ref Range   Enterococcus species NOT DETECTED NOT DETECTED   Listeria monocytogenes NOT DETECTED NOT DETECTED   Staphylococcus species NOT DETECTED NOT DETECTED   Staphylococcus aureus NOT DETECTED NOT DETECTED   Streptococcus species NOT DETECTED NOT DETECTED   Streptococcus agalactiae NOT DETECTED NOT DETECTED   Streptococcus pneumoniae NOT DETECTED NOT DETECTED   Streptococcus pyogenes NOT DETECTED NOT DETECTED   Acinetobacter baumannii NOT DETECTED NOT DETECTED   Enterobacteriaceae species NOT DETECTED NOT DETECTED   Enterobacter cloacae complex NOT DETECTED NOT DETECTED   Escherichia coli NOT DETECTED NOT DETECTED   Klebsiella oxytoca NOT DETECTED NOT DETECTED   Klebsiella pneumoniae NOT DETECTED NOT DETECTED   Proteus species NOT DETECTED NOT DETECTED   Serratia marcescens NOT DETECTED NOT DETECTED   Carbapenem resistance NOT DETECTED NOT DETECTED   Haemophilus influenzae NOT DETECTED NOT DETECTED   Neisseria meningitidis NOT DETECTED NOT DETECTED   Pseudomonas aeruginosa DETECTED (A) NOT DETECTED   Candida albicans NOT DETECTED NOT DETECTED   Candida glabrata NOT DETECTED NOT DETECTED   Candida krusei NOT DETECTED NOT DETECTED   Candida parapsilosis NOT DETECTED NOT DETECTED   Candida tropicalis NOT DETECTED NOT DETECTED    Name of physician (or Provider) Contacted: Canary BrimBrandi Ollis NP  Changes to prescribed antibiotics required: increase cefepime to 2 gr IV q8h   Adalberto ColeNikola Donaven Criswell, PharmD, BCPS Pager [phone] 03/21/2017 2:27 PM

## 2017-03-22 ENCOUNTER — Inpatient Hospital Stay (HOSPITAL_COMMUNITY): Payer: Medicare (Managed Care)

## 2017-03-22 DIAGNOSIS — N179 Acute kidney failure, unspecified: Secondary | ICD-10-CM

## 2017-03-22 DIAGNOSIS — I10 Essential (primary) hypertension: Secondary | ICD-10-CM

## 2017-03-22 DIAGNOSIS — I5030 Unspecified diastolic (congestive) heart failure: Secondary | ICD-10-CM

## 2017-03-22 LAB — CBC
HEMATOCRIT: 23.2 % — AB (ref 39.0–52.0)
HEMOGLOBIN: 8.4 g/dL — AB (ref 13.0–17.0)
MCH: 31.9 pg (ref 26.0–34.0)
MCHC: 36.2 g/dL — ABNORMAL HIGH (ref 30.0–36.0)
MCV: 88.2 fL (ref 78.0–100.0)
Platelets: 264 10*3/uL (ref 150–400)
RBC: 2.63 MIL/uL — ABNORMAL LOW (ref 4.22–5.81)
RDW: 13.6 % (ref 11.5–15.5)
WBC: 14.9 10*3/uL — AB (ref 4.0–10.5)

## 2017-03-22 LAB — BASIC METABOLIC PANEL
ANION GAP: 9 (ref 5–15)
BUN: 15 mg/dL (ref 6–20)
CO2: 28 mmol/L (ref 22–32)
Calcium: 8.7 mg/dL — ABNORMAL LOW (ref 8.9–10.3)
Chloride: 95 mmol/L — ABNORMAL LOW (ref 101–111)
Creatinine, Ser: 0.62 mg/dL (ref 0.61–1.24)
GFR calc Af Amer: >60 mL/min (ref >60)
GFR calc non Af Amer: >60 mL/min (ref >60)
Glucose, Bld: 119 mg/dL — ABNORMAL HIGH (ref 65–99)
POTASSIUM: 3.7 mmol/L (ref 3.5–5.1)
SODIUM: 132 mmol/L — AB (ref 135–145)

## 2017-03-22 LAB — URINALYSIS, ROUTINE W REFLEX MICROSCOPIC
Bilirubin Urine: NEGATIVE
Glucose, UA: NEGATIVE mg/dL
Ketones, ur: NEGATIVE mg/dL
Nitrite: NEGATIVE
PROTEIN: NEGATIVE mg/dL
Specific Gravity, Urine: 1.013 (ref 1.005–1.030)
pH: 6 (ref 5.0–8.0)

## 2017-03-22 LAB — ECHOCARDIOGRAM COMPLETE
HEIGHTINCHES: 71 in
WEIGHTICAEL: 2864 [oz_av]

## 2017-03-22 MED ORDER — HYDROCODONE-ACETAMINOPHEN 5-325 MG PO TABS
1.0000 | ORAL_TABLET | Freq: Four times a day (QID) | ORAL | Status: DC | PRN
  Administered 2017-03-22 – 2017-03-29 (×16): 1 via ORAL
  Filled 2017-03-22 (×17): qty 1

## 2017-03-22 MED ORDER — HYDROCODONE-ACETAMINOPHEN 5-325 MG PO TABS
1.0000 | ORAL_TABLET | Freq: Four times a day (QID) | ORAL | Status: DC | PRN
  Administered 2017-03-22: 1 via ORAL
  Filled 2017-03-22: qty 1

## 2017-03-22 MED ORDER — FUROSEMIDE 10 MG/ML IJ SOLN
40.0000 mg | Freq: Once | INTRAMUSCULAR | Status: AC
  Administered 2017-03-22: 40 mg via INTRAVENOUS
  Filled 2017-03-22: qty 4

## 2017-03-22 MED ORDER — POTASSIUM CHLORIDE CRYS ER 20 MEQ PO TBCR
40.0000 meq | EXTENDED_RELEASE_TABLET | Freq: Once | ORAL | Status: AC
  Administered 2017-03-22: 40 meq via ORAL
  Filled 2017-03-22: qty 2

## 2017-03-22 MED ORDER — IPRATROPIUM-ALBUTEROL 0.5-2.5 (3) MG/3ML IN SOLN
3.0000 mL | Freq: Three times a day (TID) | RESPIRATORY_TRACT | Status: DC
  Administered 2017-03-23 (×2): 3 mL via RESPIRATORY_TRACT
  Filled 2017-03-22 (×3): qty 3

## 2017-03-22 NOTE — Plan of Care (Signed)
  Activity: Risk for activity intolerance will decrease 03/22/2017 1853 - Not Progressing by Burna SisZavaleta Catalan, Johanny Segers G, RN  Pt with gen. weakness; unable to ambulate except to transfer to/from bed. Will monitor.

## 2017-03-22 NOTE — Progress Notes (Signed)
Physical Therapy Treatment Patient Details Name: Randy PickerelKenneth Ertle MRN: [id number] DOB: 1945-05-04 Today's Date: 03/22/2017    History of Present Illness Pt s/p R THR and with post op encephalopathy & respiratory failure    PT Comments    +2 mod assist for supine to sit and for sit to stand. Pt with poor posture in sitting and standing, requires assist to maintain upright position. High fall risk. +2 min A to take a few pivotal steps to recliner with RW, SaO2 88-90% on 4L O2 with activity, HR 113. Pt lives alone, stated his friend Gladstone Pihlias could assist him 24*/day, however Gladstone Pihlias stated he cannot provide 24* assist. PT strongly recommending ST-SNF, pt stated he is not agreeable to this.  Social work consult recommended.   Follow Up Recommendations  Follow surgeon's recommendation for DC plan and follow-up therapies;SNF(pt lives alone, doesn't have 24* assistance available)     Equipment Recommendations  None recommended by PT    Recommendations for Other Services OT consult     Precautions / Restrictions Precautions Precautions: Fall Precaution Comments: monitor O2 sats Restrictions Weight Bearing Restrictions: Yes RLE Weight Bearing: Weight bearing as tolerated    Mobility  Bed Mobility Overal bed mobility: Needs Assistance Bed Mobility: Supine to Sit     Supine to sit: Mod assist;+2 for physical assistance;+2 for safety/equipment     General bed mobility comments: +2 mod assist for supine to sit, pt leans L in sitting and in supine, pt unable to independently maintain neutral upright position in sitting   Transfers Overall transfer level: Needs assistance Equipment used: Rolling walker (2 wheeled) Transfers: Sit to/from [employer];Stand Pivot Transfers Sit to Stand: Min assist;+2 physical assistance Stand pivot transfers: +2 physical assistance;Mod assist       General transfer comment: cues for safety, transition position and use of UEs to self assist; pt on 4L O2 [place] during  activity, Sats 88-90%, +2 for safety, pt had flexed trunk, unable to come to full upright position with verbal/manual cues  Ambulation/Gait   Ambulation Distance (Feet): 2 Feet Assistive device: Rolling walker (2 wheeled) Gait Pattern/deviations: Step-to pattern;Shuffle;Decreased stride length     General Gait Details: + 2 assist for safety,   Pt forward flexed over walker despite MAX VC's to increase upright posture, distance limited by R hip pain/fatigue   Stairs            Wheelchair Mobility    Modified Rankin (Stroke Patients Only)       Balance Overall balance assessment: Needs assistance Sitting-balance support: Feet supported;Bilateral upper extremity supported Sitting balance-Leahy Scale: Poor Sitting balance - Comments: leans Left   Standing balance support: Bilateral upper extremity supported Standing balance-Leahy Scale: Poor Standing balance comment: relies on BUE support, forward flexed                            Cognition Arousal/Alertness: Awake/alert Behavior During Therapy: WFL for tasks assessed/performed Overall Cognitive Status: Within Functional Limits for tasks assessed                                        Exercises Total Joint Exercises Ankle Circles/Pumps: AROM;Both;15 reps;Supine Heel Slides: AAROM;Right;Supine;10 reps Hip ABduction/ADduction: AAROM;Right;Supine;10 reps    General Comments        Pertinent Vitals/Pain Faces Pain Scale: Hurts whole lot Pain Location: R hip Pain Descriptors /  Indicators: Guarding;Grimacing;Moaning Pain Intervention(s): Limited activity within patient's tolerance;Monitored during session;RN gave pain meds during session;Patient requesting pain meds-RN notified    Home Living                      Prior Function            PT Goals (current goals can now be found in the care plan section) Acute Rehab PT Goals Patient Stated Goal: Go home PT Goal  Formulation: With patient Time For Goal Achievement: 04/18/2017 Potential to Achieve Goals: Good Progress towards PT goals: Progressing toward goals    Frequency    7X/week      PT Plan Current plan remains appropriate    Co-evaluation              AM-PAC PT "6 Clicks" Daily Activity  Outcome Measure  Difficulty turning over in bed (including adjusting bedclothes, sheets and blankets)?: Unable Difficulty moving from lying on back to sitting on the side of the bed? : Unable Difficulty sitting down on and standing up from a chair with arms (e.g., wheelchair, bedside commode, etc,.)?: Unable Help needed moving to and from a bed to chair (including a wheelchair)?: A Lot Help needed walking in hospital room?: A Lot Help needed climbing 3-5 steps with a railing? : Total 6 Click Score: 8    End of Session Equipment Utilized During Treatment: Gait belt;Oxygen Activity Tolerance: Patient limited by fatigue;Patient limited by pain Patient left: in chair;with call bell/phone within reach;with chair alarm set;with nursing/sitter in room Nurse Communication: Mobility status PT Visit Diagnosis: Unsteadiness on feet (R26.81);Difficulty in walking, not elsewhere classified (R26.2)     Time: 1610-9604 PT Time Calculation (min) (ACUTE ONLY): 26 min  Charges:  $Therapeutic Exercise: 8-22 mins $Therapeutic Activity: 8-22 mins                    G Codes:          Ralene Bathe Kistler 03/22/2017, 1:03 PM [phone]

## 2017-03-22 NOTE — Progress Notes (Signed)
Pt currently on 2 LPM [place] and tolerating well at this time.  Pt appears to be in no respiratory distress, RT will hold BIPAP at this time.  RT to monitor and assess as needed.

## 2017-03-22 NOTE — Progress Notes (Signed)
  Echocardiogram 2D Echocardiogram has been performed.  Celene SkeenVijay  Conard Alvira 03/22/2017, 11:50 AM

## 2017-03-22 NOTE — Progress Notes (Signed)
PULMONARY / CRITICAL CARE MEDICINE   Name: Randy Burgess MRN: [id number] DOB: [birth year]    ADMISSION DATE:  03/15/2017 CONSULTATION DATE:  03/15/2017  REFERRING MD:  Randy Burgess  CHIEF COMPLAINT:  Confusion, hyponatremia  HISTORY OF PRESENT ILLNESS:   72 y/o male underwent an elective R total hip replacement on 2/13 was brought to the ICU confused, in respiratory failure on BIPAP post operatively.  He was found to have hyponatremia on admission to the ICU.  He has an extensive smoking history. Prolonged ICU course due to delirium.    SUBJECTIVE:  I/O 3450 UOP with lasix, net neg 2.1 L in last 24 hours.  Afebrile.  Pt eating breakfast but states "I can't eat it all".  [place] out of nose and resting on cheek > sats 91-92%.   VITAL SIGNS: BP (!) 123/46   Pulse 76   Temp (!) 97.5 F (36.4 C) (Oral)   Resp (!) 22   Ht 5\' 11"  (1.803 m) Comment: Simultaneous filing. User may not have seen previous data.  Wt 179 lb (81.2 kg) Comment: Simultaneous filing. User may not have seen previous data.  SpO2 99%   BMI 24.97 kg/m   HEMODYNAMICS:    VENTILATOR SETTINGS: Vent Mode: PCV;BIPAP FiO2 (%):  [60 %-100 %] 60 % Set Rate:  [16 bmp] 16 bmp PEEP:  [5 cmH20] 5 cmH20  INTAKE / OUTPUT: I/O last 3 completed shifts: In: 2127.5 [P.O.:600; I.V.:1027.5; IV Piggyback:500] Out: 4250 [Urine:4250]  PHYSICAL EXAMINATION: General: disheveled adult male in NAD, sitting up eating breakfast  HEENT: MM pink/moist, no jvd PSY: calm/appropriate, poor eye contact  Neuro: AAOx3, speech clear, MAE  CV: s1s2 rrr, no m/r/g PULM: even/non-labored, lungs bilaterally distant but clear anterior, few basilar crackles posterior lower  WU:JWJX, non-tender, bsx4 active  Extremities: warm/dry, no edema  Skin: no rashes or lesions  LABS:  BMET Recent Labs  Lab 03/20/17 1311 03/21/17 0328 03/22/17 0721  NA 128* 132* 132*  K 4.4 3.9 3.7  CL 93* 95* 95*  CO2 23 23 28   BUN 10 9 15   CREATININE 0.49* 0.56* 0.62   GLUCOSE 89 100* 119*    Electrolytes Recent Labs  Lab 03/20/17 0345 03/20/17 1311 03/21/17 0328 03/22/17 0721  CALCIUM 9.1 9.1 9.1 8.7*  MG 1.3*  --   --   --   PHOS 3.3  --   --   --     CBC Recent Labs  Lab 03/19/17 0758 03/20/17 0345 03/22/17 0721  WBC 29.6* 30.3* 14.9*  HGB 8.6* 9.5* 8.4*  HCT 23.3* 26.1* 23.2*  PLT 151 207 264    Coag's No results for input(s): APTT, INR in the last 168 hours.  Sepsis Markers No results for input(s): LATICACIDVEN, PROCALCITON, O2SATVEN in the last 168 hours.  ABG Recent Labs  Lab 03/19/17 0645 03/20/17 0620 03/20/17 0740  PHART 7.427 7.239* 7.326*  PCO2ART 33.1 64.4* 51.4*  PO2ART 51.7* 75.5* 83.6    Liver Enzymes Recent Labs  Lab 03/19/17 0758  AST 64*  ALT 35  ALKPHOS 75  BILITOT 0.9  ALBUMIN 3.4*    Cardiac Enzymes No results for input(s): TROPONINI, PROBNP in the last 168 hours.  Glucose Recent Labs  Lab 03/18/17 0733 03/20/17 0621  GLUCAP 169* 137*    Imaging Dg Chest Port 1 View  Result Date: 03/21/2017 CLINICAL DATA:  Acute respiratory failure.  Hypoxemia. EXAM: PORTABLE CHEST 1 VIEW COMPARISON:  Single-view of the chest 03/15/2017, 03/18/2017 and 03/20/2017. FINDINGS: The patient has  a new moderate left pleural effusion and basilar airspace disease. The right lung is clear. There is abnormal soft tissue density and widening of the right paratracheal stripe highly suspicious for lymphadenopathy. Heart size is normal. IMPRESSION: New left effusion and airspace disease worrisome for pneumonia. Findings suspicious for mediastinal lymphadenopathy. CT chest with contrast is recommended for further evaluation. Electronically Signed   By: Drusilla Kannerhomas  Burgess M.D.   On: 03/21/2017 10:52     STUDIES:  LUE Venous Duplex 2/19 >>   CULTURES: 2/18 blood >> GNR (aerobic only) >>   ANTIBIOTICS: Vanc 2/18 >> 2/19 Cefepime 2/18 >>  SIGNIFICANT EVENTS: 2/13 R hip replacement 2/18 Sedate early am, improved  with holding meds 2/19 On/off bipap, mental status improved   LINES/TUBES: 2/17 BIPAP  DISCUSSION: 72 y/o male with a history of EtOH and tobacco abuse here with post operative encephalopathy and hyponatremia after an elective R hip replacement. As of 2/18 he has HCAP.   ASSESSMENT / PLAN:  PULMONARY A: COPD Acute hypercarbic respiratory failure> improved Acute hypoxemic respiratory failure > persistent 2/19, requiring BIPAP intermittently, suspect volume overload (acute pulmonary edema) R upper lobe mass? Chest congestion, difficult P:   Hold sedating medications  Follow intermittent CXR  Will need CT Chest when respiratory status improves to evaluate for mass / LAN OOB to chair  Repeat lasix 2/20 Change HFNC to [place] O2 Wean O2 for sats 88-95% Nicotine patch  Duoneb Q6 Mucinex BID Hypertonic Saline neb to end 2/20 Scheduled pseudophed > home med, would not stop  CARDIOVASCULAR A:  Hypertension - improved P:  Tele monitoring  Continue benazepril, amlodipine  Assess ECHO, mildly elevated BNP Lasix as above  RENAL A:   SIADH - improved P:   Trend BMP / urinary output Replace electrolytes as indicated Avoid nephrotoxic agents, ensure adequate renal perfusion  GASTROINTESTINAL A:   No acute issues P:   Diet as tolerated  Aspiration precautions  Pepcid  Colace BID PRN bowel regimen   HEMATOLOGIC A:   Anemia - no evidence of bleeding Left arm swelling   P:  Await LUE US Rivaroxiban for DVT   INFECTIOUS A:   HCAP P:   Continue cefepime  Follow cultures  Have asked nursing to send UA > ordered 2/19  ENDOCRINE A:   No acute issues   P:   Monitor glucose on BMP   NEUROLOGIC A:   Acute encephalopathy 2/18> ICU delirium complicated by HCAP and ativan use Prior EtOH use, but Attorney in fact (neighbor) says that he hasn't had EtOH in over a month S/P Total Hip replacement P:   AVOID BENZO's  PRN haldol for severe agitation, if not improved then  precedex  PT efforts  OOB to chair    FAMILY  - Updates: No family at bedside 2/20.  [name] (friend, attorney in fact for healthcare affairs) updated on 2/19  - Inter-disciplinary family meet or Palliative Care meeting due by:  day 7   Randy BrimBrandi Geena Weinhold, NP-C [facility] Pgr: [phone] or if no answer [phone] 03/22/2017, 9:51 AM

## 2017-03-22 NOTE — Progress Notes (Signed)
Placed Sterile Water into [place] HFNC 02 system.

## 2017-03-23 ENCOUNTER — Inpatient Hospital Stay (HOSPITAL_COMMUNITY): Payer: Medicare (Managed Care)

## 2017-03-23 LAB — CBC
HCT: 23 % — ABNORMAL LOW (ref 39.0–52.0)
Hemoglobin: 8.2 g/dL — ABNORMAL LOW (ref 13.0–17.0)
MCH: 31.9 pg (ref 26.0–34.0)
MCHC: 35.7 g/dL (ref 30.0–36.0)
MCV: 89.5 fL (ref 78.0–100.0)
PLATELETS: 308 10*3/uL (ref 150–400)
RBC: 2.57 MIL/uL — ABNORMAL LOW (ref 4.22–5.81)
RDW: 13.6 % (ref 11.5–15.5)
WBC: 14.1 10*3/uL — AB (ref 4.0–10.5)

## 2017-03-23 LAB — BASIC METABOLIC PANEL
ANION GAP: 10 (ref 5–15)
BUN: 12 mg/dL (ref 6–20)
CO2: 26 mmol/L (ref 22–32)
Calcium: 8.7 mg/dL — ABNORMAL LOW (ref 8.9–10.3)
Chloride: 96 mmol/L — ABNORMAL LOW (ref 101–111)
Creatinine, Ser: 0.58 mg/dL — ABNORMAL LOW (ref 0.61–1.24)
GFR calc Af Amer: >60 mL/min (ref >60)
GLUCOSE: 111 mg/dL — AB (ref 65–99)
Potassium: 3.7 mmol/L (ref 3.5–5.1)
SODIUM: 132 mmol/L — AB (ref 135–145)

## 2017-03-23 LAB — CULTURE, BLOOD (ROUTINE X 2): Special Requests: ADEQUATE

## 2017-03-23 LAB — COMPREHENSIVE METABOLIC PANEL
ALT: 40 U/L (ref 17–63)
AST: 39 U/L (ref 15–41)
Albumin: 3.4 g/dL — ABNORMAL LOW (ref 3.5–5.0)
Alkaline Phosphatase: 84 U/L (ref 38–126)
Anion gap: 10 (ref 5–15)
BUN: 12 mg/dL (ref 6–20)
CHLORIDE: 97 mmol/L — AB (ref 101–111)
CO2: 27 mmol/L (ref 22–32)
Calcium: 9.3 mg/dL (ref 8.9–10.3)
Creatinine, Ser: 0.61 mg/dL (ref 0.61–1.24)
Glucose, Bld: 115 mg/dL — ABNORMAL HIGH (ref 65–99)
POTASSIUM: 4.3 mmol/L (ref 3.5–5.1)
Sodium: 134 mmol/L — ABNORMAL LOW (ref 135–145)
Total Bilirubin: 0.9 mg/dL (ref 0.3–1.2)
Total Protein: 6.8 g/dL (ref 6.5–8.1)

## 2017-03-23 MED ORDER — POTASSIUM CHLORIDE CRYS ER 20 MEQ PO TBCR
20.0000 meq | EXTENDED_RELEASE_TABLET | ORAL | Status: AC
  Administered 2017-03-23 (×2): 20 meq via ORAL
  Filled 2017-03-23 (×2): qty 1

## 2017-03-23 MED ORDER — RISPERIDONE 0.25 MG PO TABS
0.5000 mg | ORAL_TABLET | Freq: Every day | ORAL | Status: DC
  Administered 2017-03-23 (×2): 0.5 mg via ORAL
  Filled 2017-03-23 (×2): qty 2

## 2017-03-23 MED ORDER — RISPERIDONE 0.25 MG PO TABS: 0.5000 mg | ORAL_TABLET | Freq: Every day | ORAL | Status: DC

## 2017-03-23 NOTE — Progress Notes (Signed)
[facility]AC[facility] ONLY  The patient does [facility]ly[facility] Protocol based on the criteria listed below:   1. Is GFR >/= 40 ml/min? Yes.    Patient's GFR today is >60 2. Is urine output >/= 0.5 ml/kg/hr for the last 6 hours? Yes.   Patient's UOP is .65 ml/kg/hr 3. Is BUN < 60 mg/dL? Yes.    Patient's BUN today is 12 4. Abnormal electrolyte(s): K- 3.7 5. Ordered repletion with: per protocol 6. If a panic level lab has been reported, has the CCM MD in charge been notified? Yes.  .   Physician:  Dr. Edson Snowballamachandran  Takelia Urieta, Dixon BoosMaria Samson 03/23/2017 5:21 AM

## 2017-03-23 NOTE — Progress Notes (Signed)
Pharmacy Antibiotic Note  Randy PickerelKenneth Burgess is a 72 y.o. male admitted on 03/15/2017 with HCAP.  Pharmacy has been consulted for vancomycin and cefepime dosing. On 2/19, notified of positive blood cultures for Pseudomonas aeruginosa, cefepime dose increased to 2g IV q8h accordingly and vancomycin dc'd.  Today, 03/23/2017 Day #3 Cefepime 2g IV q8h Afebrile WBC elevated 14.1 but overall improved SCr stable, CrCl ~90 ml/min Pseudomonas sensitivities pending  Plan:  Continue cefepime 2g IV q8h  Monitor clinical course, renal function, pseudomonas sensitivities   Height: 5\' 11"  (180.3 cm)(Simultaneous filing. User may not have seen previous data.) Weight: 179 lb (81.2 kg)(Simultaneous filing. User may not have seen previous data.) IBW/kg (Calculated) : 75.3  Temp (24hrs), Avg:97.8 F (36.6 C), Min:97.4 F (36.3 C), Max:98.5 F (36.9 C)  Recent Labs  Lab 03/18/17 0322 03/19/17 0758 03/20/17 0345 03/20/17 1311 03/21/17 0328 03/22/17 0721 03/23/17 0316  WBC 20.8* 29.6* 30.3*  --   --  14.9* 14.1*  CREATININE 0.66 0.68 0.57* 0.49* 0.56* 0.62 0.58*    Estimated Creatinine Clearance: 91.5 mL/min (A) (by C-G formula based on SCr of 0.58 mg/dL (L)).    Allergies  Allergen Reactions  . Penicillins     Childhood allergy Has patient had a PCN reaction causing immediate rash, facial/tongue/throat swelling, SOB or lightheadedness with hypotension: Unknown Has patient had a PCN reaction causing severe rash involving mucus membranes or skin necrosis: Unknown Has patient had a PCN reaction that required hospitalization: Unknown Has patient had a PCN reaction occurring within the last 10 years: No If all of the above answers are "NO", then may proceed with Cephalosporin use.   . Sulfa Antibiotics     Unknown reactions     Antimicrobials this admission:  2/18 vanc >> 2/19 2/18 cefepime >>   Dose adjustments this admission:  2/19 inc cefepime to 2 gr IV q8h  Microbiology results:   2/18 BCx: 1/2 pseudomonas, no resistance gene 2/14 MRSA PCR: negative  Thank you for allowing pharmacy to be a part of this patient's care.  Loralee PacasErin Malaiyah Achorn, PharmD, BCPS Pager: [phone] 03/23/2017 8:50 AM

## 2017-03-23 NOTE — Progress Notes (Signed)
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 Performed R THA exercises with min A. PT continues to recommend ST-SNF as pt doesn't have 24* assist at home. Pt resistant to ST-SNF, but agreeable to more information from Child psychotherapist.    Follow Up Recommendations  Follow surgeon's recommendation for DC plan and follow-up therapies;SNF(pt lives alone, doesn't have 24* assistance available)     Equipment Recommendations  None recommended by PT    Recommendations for Other Services OT consult     Precautions / Restrictions Precautions Precautions: Fall Precaution Comments: monitor O2 sats Restrictions Weight Bearing Restrictions: Yes RLE Weight Bearing: Weight bearing as tolerated    Mobility  Bed Mobility               General bed mobility comments: up in recliner  Transfers Overall transfer level: Needs assistance Equipment used: Rolling walker (2 wheeled) Transfers: Sit to/from Stand Sit to Stand: +2 safety/equipment;Mod assist         General transfer comment: +2 mod A to power up from recliner (+2 for safety/lines); VCs hand placement, scoot forward and to uncross ankles, pt on 2L O2 with SaO2 92% with activity  Ambulation/Gait Ambulation/Gait assistance: Min assist;+2 safety/equipment Ambulation Distance (Feet): 25 Feet Assistive device: Rolling walker (2 wheeled) Gait Pattern/deviations: Step-to pattern;Shuffle;Decreased stride length Gait velocity: decr   General Gait Details: SaO2 92% on 2L O2 ambulating, HR 112, significantly flexed trunk (unable to come to full upright position, pt states this is baseline), +2 safety/lines/follow with recliner;  distance limited by pt fatigue 3/4 dyspnea   Stairs            Wheelchair Mobility    Modified Rankin (Stroke Patients Only)       Balance Overall balance assessment: Needs assistance Sitting-balance support: Feet supported;Bilateral upper extremity supported Sitting balance-Leahy Scale: Fair Sitting balance - Comments: leans Left   Standing balance support: Bilateral upper extremity supported Standing balance-Leahy Scale: Poor Standing balance comment: relies on BUE support, forward flexed                            Cognition Arousal/Alertness: Awake/alert Behavior During Therapy: WFL for tasks assessed/performed;Agitated(some agitation/frustration regarding hard day yesterday (bowel incontinence and IV issue) and prolonged hosptialization) Overall Cognitive Status: Within Functional Limits for tasks assessed                                        Exercises Total Joint Exercises Ankle Circles/Pumps: AROM;Both;15 reps;Supine;AAROM Short Arc Quad: AAROM;AROM;Right;Supine;15 reps Heel Slides: AAROM;Right;Supine;20 reps Hip ABduction/ADduction: AAROM;Right;Supine;15 reps Long Arc Quad: AROM;Right;10 reps;Seated    General Comments        Pertinent Vitals/Pain Pain Assessment: Faces Faces Pain Scale: Hurts little more Pain Location: R hip Pain Descriptors / Indicators: Grimacing;Guarding Pain Intervention(s): Monitored during session;Limited activity within patient's tolerance;Patient requesting pain meds-RN notified    Home Living                      Prior Function  PT Goals (current goals can now be found in the care plan section) Acute Rehab PT Goals Patient Stated Goal: Go home PT Goal Formulation: With patient Time For Goal Achievement: 2017/06/17 Potential to Achieve Goals: Good Progress towards PT goals: Progressing toward goals    Frequency    7X/week      PT Plan Current plan remains  appropriate    Co-evaluation              AM-PAC PT "6 Clicks" Daily Activity  Outcome Measure  Difficulty turning over in bed (including adjusting bedclothes, sheets and blankets)?: Unable Difficulty moving from lying on back to sitting on the side of the bed? : Unable Difficulty sitting down on and standing up from a chair with arms (e.g., wheelchair, bedside commode, etc,.)?: Unable Help needed moving to and from a bed to chair (including a wheelchair)?: A Little Help needed walking in hospital room?: A Little Help needed climbing 3-5 steps with a railing? : Total 6 Click Score: 10    End of Session Equipment Utilized During Treatment: Gait belt;Oxygen Activity Tolerance: Patient limited by fatigue Patient left: in chair;with call bell/phone within reach;with chair alarm set Nurse Communication: Mobility status;Other (comment)(social work consult needed) PT Visit Diagnosis: Unsteadiness on feet (R26.81);Difficulty in walking, not elsewhere classified (R26.2)     Time: [id number] PT Time Calculation (min) (ACUTE ONLY): 31 min  Charges:  $Gait Training: 8-22 mins $Therapeutic Exercise: 8-22 mins                    G Codes:          Tamala SerUhlenberg, Randy Burgess 03/23/2017, 11:14 AM [phone]

## 2017-03-23 NOTE — Progress Notes (Signed)
Patient Demographics:    Randy Burgess, is a 72 y.o. male, DOB - 10-Apr-1945, ZOX:[id number]  Admit date - 03/15/2017   Admitting Physician Ollen Gross, MD  Outpatient Primary MD for the patient is McGowen, Maryjean Morn, MD  LOS - 8   No chief complaint on file.       Subjective:    Randy Burgess today has no fevers, no emesis,  No chest pain,  Moody at times   Assessment  & Plan :    Principal Problem:   OA (osteoarthritis) of hip Active Problems:   H/O total hip arthroplasty  Brief Summary:- Randy Burgess is a 72 y.o. male who underwent Rt Hip Replacement on 03/15/17, postop developed hyponatremia, delirium, and acute hypoxic respiratory failure, was admitted to ICU on BiPAP , found to have HCAP, blood cultures subsequently grew Pseudomonas, vancomycin discontinued and currently on IV cefepime 2gm q 8 hr since 03/21/2017, transferred to hospitalist service on 03/23/17   Plan:- 1)Pseudomonas HCAP-continue IV cefepime 2 g every 8 hours (since 03/21/17), continue  mucolytics and bronchodilators  2)HFpEF-patient most likely has acute on chronic diastolic dysfunction CHF (long-standing history of hypertension), last known EF 60-65%, continue diuresis  3)HTN-continue amlodipine 10 mg daily on benazepril 20 mg daily,  may use IV labetalol when necessary  Every 4 hours for systolic blood pressure over 160 mmhg  4)S/p ORIF of Rt Hip Fx-continue physical therapy, continue Xarelto for DVT prophylaxis,  5)FEN-hypokalemia/hyponatremia-sodium is 132, potassium 3.7, continue to monitor  6)Rt UL Mass-patient will need CT chest for further evaluation when respiratory status improves enough for him to lay flat  7)H/o Etoh and Tobacco Abuse- No DTs,   8)Anemia- Hgb is 8.2, transfuse if clinically indicated  9)Possible UTI-continue cefepime pending cultures  10)Dispo-patient will need skilled nursing  facility placement   Code Status : Full Code  Disposition Plan  : SNF (PTA lived alone)  Consults  :  Ortho/PCCM  DVT Prophylaxis  :  Xarelto  Lab Results  Component Value Date   PLT 308 03/23/2017    Inpatient Medications  Scheduled Meds: . amLODipine  10 mg Oral Daily  . benazepril  20 mg Oral Daily  . chlorhexidine  15 mL Mouth Rinse BID  . docusate sodium  100 mg Oral BID  . famotidine  20 mg Oral BID  . folic acid  1 mg Oral Daily  . guaiFENesin  1,200 mg Oral BID  . ipratropium-albuterol  3 mL Nebulization TID  . mouth rinse  15 mL Mouth Rinse q12n4p  . nicotine  21 mg Transdermal Daily  . pseudoephedrine  30 mg Oral Daily  . risperiDONE  0.5 mg Oral QHS  . rivaroxaban  10 mg Oral Q breakfast   Continuous Infusions: . ceFEPime (MAXIPIME) IV Stopped (03/23/17 1340)  . dexmedetomidine (PRECEDEX) IV infusion     PRN Meds:.albuterol, bisacodyl, haloperidol lactate, HYDROcodone-acetaminophen, ibuprofen, labetalol, menthol-cetylpyridinium **OR** phenol, [DISCONTINUED] ondansetron **OR** ondansetron (ZOFRAN) IV, polyethylene glycol, sodium chloride, sodium phosphate    Anti-infectives (From admission, onward)   Start     Dose/Rate Route Frequency Ordered Stop   03/21/17 1500  ceFEPIme (MAXIPIME) 2 g in sodium chloride 0.9 % 100 mL IVPB     2 g 200 mL/hr over  30 Minutes Intravenous Every 8 hours 03/21/17 1426     03/21/17 0200  vancomycin (VANCOCIN) IVPB 1000 mg/200 mL premix  Status:  Discontinued     1,000 mg 200 mL/hr over 60 Minutes Intravenous Every 12 hours 03/20/17 1440 03/21/17 1008   03/20/17 1400  ceFEPIme (MAXIPIME) 1 g in sodium chloride 0.9 % 100 mL IVPB  Status:  Discontinued     1 g 200 mL/hr over 30 Minutes Intravenous Every 8 hours 03/20/17 1304 03/21/17 1425   03/20/17 1400  vancomycin (VANCOCIN) 1,500 mg in sodium chloride 0.9 % 500 mL IVPB     1,500 mg 250 mL/hr over 120 Minutes Intravenous  Once 03/20/17 1304 03/20/17 1724   03/16/17 0030   vancomycin (VANCOCIN) IVPB 1000 mg/200 mL premix     1,000 mg 200 mL/hr over 60 Minutes Intravenous Every 12 hours 03/15/17 1648 03/16/17 1229   03/15/17 0956  vancomycin (VANCOCIN) IVPB 1000 mg/200 mL premix     1,000 mg 200 mL/hr over 60 Minutes Intravenous On call to O.R. 03/15/17 [id number] 03/15/17 1247   03/15/17 0956  vancomycin (VANCOCIN) 1-5 GM/200ML-% IVPB    Comments:  Curlene DolphinWhitlow, Cheryl   : cabinet override      03/15/17 0956 03/15/17 1147        Objective:   Vitals:   03/23/17 0820 03/23/17 1200 03/23/17 1315 03/23/17 1610  BP: 135/61  125/65 (!) 121/42  Pulse: 94   (!) 101  Resp: 15  (!) 23 (!) 25  Temp:  98.1 F (36.7 C)  (!) 97.2 F (36.2 C)  TempSrc:  Axillary  Oral  SpO2: (!) 89%   96%  Weight:      Height:        Wt Readings from Last 3 Encounters:  03/15/17 81.2 kg (179 lb)  02/28/17 81.2 kg (179 lb)  02/08/17 82.8 kg (182 lb 8 oz)     Intake/Output Summary (Last 24 hours) at 03/23/2017 1754 Last data filed at 03/23/2017 1340 Gross per 24 hour  Intake 300 ml  Output 895 ml  Net -595 ml     Physical Exam  Gen:- Awake Alert,  In no apparent distress  HEENT:- Gans.AT, No sclera icterus Neck-Supple Neck,No JVD,.  Lungs-fair air movement, no wheezing CV- S1, S2 normal Abd-  +ve B.Sounds, Abd Soft, No tenderness,    Extremity/Skin:-Right hip wound clean dry and intact,  psych-affect is appropriate, moody at times Neuro-no new focal deficits   Data Review:   Micro Results Recent Results (from the past 240 hour(s))  MRSA PCR Screening     Status: None   Collection Time: 03/16/17  6:20 AM  Result Value Ref Range Status   MRSA by PCR NEGATIVE NEGATIVE Final    Comment:        The GeneXpert MRSA Assay (FDA approved for NASAL specimens only), is one component of a comprehensive MRSA colonization surveillance program. It is not intended to diagnose MRSA infection nor to guide or monitor treatment for MRSA infections. Performed at [facility], [address] [place], [place] [address]   Culture, blood (Routine X 2) w Reflex to ID Panel     Status: Abnormal   Collection Time: 03/20/17 12:58 PM  Result Value Ref Range Status   Specimen Description   Final    BLOOD LEFT HAND Performed at [facility], [address] [place], [place] [address]    Special Requests   Final    BOTTLES  DRAWN AEROBIC ONLY Blood Culture adequate volume Performed at [facility], [address] [place], [place] [address]    Culture  Setup Time   Final    GRAM NEGATIVE RODS AEROBIC BOTTLE ONLY CRITICAL RESULT CALLED TO, READ BACK BY AND VERIFIED WITH: M. Derrick Ravel Pharm.D. 14:20 03/21/17 ([email or web address]) Performed at [facility], [address] [place], [place] [address]    Culture PSEUDOMONAS AERUGINOSA (A)  Final   Report Status 03/23/2017 FINAL  Final   Organism ID, Bacteria PSEUDOMONAS AERUGINOSA  Final      Susceptibility   Pseudomonas aeruginosa - MIC*    CEFTAZIDIME 4 SENSITIVE Sensitive     CIPROFLOXACIN <=0.25 SENSITIVE Sensitive     GENTAMICIN <=1 SENSITIVE Sensitive     IMIPENEM 1 SENSITIVE Sensitive     PIP/TAZO 8 SENSITIVE Sensitive     CEFEPIME 2 SENSITIVE Sensitive     * PSEUDOMONAS AERUGINOSA  Blood Culture ID Panel (Reflexed)     Status: Abnormal   Collection Time: 03/20/17 12:58 PM  Result Value Ref Range Status   Enterococcus species NOT DETECTED NOT DETECTED Final   Listeria monocytogenes NOT DETECTED NOT DETECTED Final   Staphylococcus species NOT DETECTED NOT DETECTED Final   Staphylococcus aureus NOT DETECTED NOT DETECTED Final   Streptococcus species NOT DETECTED NOT DETECTED Final   Streptococcus agalactiae NOT DETECTED NOT DETECTED Final   Streptococcus pneumoniae NOT DETECTED NOT DETECTED Final   Streptococcus pyogenes NOT DETECTED NOT DETECTED Final   Acinetobacter baumannii NOT DETECTED NOT DETECTED Final   Enterobacteriaceae species NOT DETECTED NOT  DETECTED Final   Enterobacter cloacae complex NOT DETECTED NOT DETECTED Final   Escherichia coli NOT DETECTED NOT DETECTED Final   Klebsiella oxytoca NOT DETECTED NOT DETECTED Final   Klebsiella pneumoniae NOT DETECTED NOT DETECTED Final   Proteus species NOT DETECTED NOT DETECTED Final   Serratia marcescens NOT DETECTED NOT DETECTED Final   Carbapenem resistance NOT DETECTED NOT DETECTED Final   Haemophilus influenzae NOT DETECTED NOT DETECTED Final   Neisseria meningitidis NOT DETECTED NOT DETECTED Final   Pseudomonas aeruginosa DETECTED (A) NOT DETECTED Final    Comment: CRITICAL RESULT CALLED TO, READ BACK BY AND VERIFIED WITH: M. Derrick Ravel Pharm.D. 14:20 03/21/17 ([email or web address])    Candida albicans NOT DETECTED NOT DETECTED Final   Candida glabrata NOT DETECTED NOT DETECTED Final   Candida krusei NOT DETECTED NOT DETECTED Final   Candida parapsilosis NOT DETECTED NOT DETECTED Final   Candida tropicalis NOT DETECTED NOT DETECTED Final  Culture, blood (Routine X 2) w Reflex to ID Panel     Status: None (Preliminary result)   Collection Time: 03/20/17  1:11 PM  Result Value Ref Range Status   Specimen Description   Final    BLOOD LEFT HAND Performed at [facility], [address] [place], [place] [address]    Special Requests   Final    BOTTLES DRAWN AEROBIC ONLY Blood Culture adequate volume Performed at [facility], [address], [place], [place] [address]    Culture   Final    NO GROWTH 3 DAYS Performed at [facility], [address], [place], [place] [address]    Report Status PENDING  Incomplete    Radiology Reports X-ray Chest Pa Or Ap  Result Date: 03/15/2017 CLINICAL DATA:  Shortness of breath. EXAM: CHEST 1 VIEW COMPARISON:  None. FINDINGS: The patient is slightly rotated to the left. Normal heart size. Accentuation of  the right paratracheal stripe is likely due to rotation. Elevation of the left hemidiaphragm. Bibasilar  atelectasis. No focal consolidation, pleural effusion, or pneumothorax. No acute osseous abnormality. IMPRESSION: 1. Accentuation of the right paratracheal stripe is likely due to patient rotation. Recommend dedicated PA and lateral views to exclude underlying adenopathy/mass. 2. Bibasilar atelectasis. Electronically Signed   By: Obie Dredge M.D.   On: 03/15/2017 14:43   Dg Pelvis Portable  Result Date: 03/15/2017 CLINICAL DATA:  Right hip replacement. EXAM: PORTABLE PELVIS 1-2 VIEWS COMPARISON:  None. FINDINGS: AP view of the pelvis demonstrates right hip arthroplasty, without acute hardware complication. Severe left hip osteoarthritis including joint space narrowing and subchondral sclerosis. IMPRESSION: Expected appearance after right hip arthroplasty. Electronically Signed   By: Jeronimo Greaves M.D.   On: 03/15/2017 14:43   Dg Chest Port 1 View  Result Date: 03/23/2017 CLINICAL DATA:  Respiratory failure. EXAM: PORTABLE CHEST 1 VIEW COMPARISON:  03/21/2016.  03/20/2017.  03/18/2017. FINDINGS: Persistent mediastinal fullness. Again adenopathy could present this fashion. Again contrast-enhanced CT of the chest is recommended for further evaluation. Heart size stable. Persistent bibasilar atelectasis/infiltrates. Small left pleural effusion. No change from prior exam. No pneumothorax. IMPRESSION: 1. Persistent mediastinal fullness. Again adenopathy could present this fashion. Again contrast-enhanced CT of the chest is recommended for further evaluation. 2. Low lung volumes with persistent bibasilar atelectasis/infiltrates. Small left pleural effusion. No interim change from prior exam. Electronically Signed   By: Maisie Fus  Register   On: 03/23/2017 07:05   Dg Chest Port 1 View  Result Date: 03/21/2017 CLINICAL DATA:  Acute respiratory failure.  Hypoxemia. EXAM: PORTABLE CHEST 1 VIEW COMPARISON:  Single-view of the chest 03/15/2017, 03/18/2017 and 03/20/2017. FINDINGS: The patient has a new moderate left  pleural effusion and basilar airspace disease. The right lung is clear. There is abnormal soft tissue density and widening of the right paratracheal stripe highly suspicious for lymphadenopathy. Heart size is normal. IMPRESSION: New left effusion and airspace disease worrisome for pneumonia. Findings suspicious for mediastinal lymphadenopathy. CT chest with contrast is recommended for further evaluation. Electronically Signed   By: Drusilla Kanner M.D.   On: 03/21/2017 10:52   Dg Chest Port 1 View  Result Date: 03/20/2017 CLINICAL DATA:  Respiratory failure EXAM: PORTABLE CHEST 1 VIEW COMPARISON:  March 18, 2017. FINDINGS: There is a small left pleural effusion with patchy bibasilar atelectasis. There is mild consolidation in the medial left base. Heart is upper normal in size with pulmonary vascularity within normal limits. There is aortic atherosclerosis. No adenopathy. No bone lesions. IMPRESSION: Bibasilar atelectasis with small pleural effusions. Focal consolidation medial left base, new from most recent study and likely representing focal pneumonia. Stable cardiac silhouette.  There is aortic atherosclerosis. Aortic Atherosclerosis (ICD10-I70.0). Electronically Signed   By: Bretta Bang III M.D.   On: 03/20/2017 07:02   Dg Chest Port 1 View  Result Date: 03/18/2017 CLINICAL DATA:  Acute respiratory failure. History of hypertension, smoker. EXAM: PORTABLE CHEST 1 VIEW COMPARISON:  Chest x-ray from earlier same day and chest x-ray dated 03/15/2017. FINDINGS: Study is again hypoinspiratory with crowding of the perihilar and bibasilar bronchovascular markings. Persistent bibasilar opacities, most likely atelectasis. Probable small left pleural effusion with associated blunting of the left costophrenic angle. No new lung findings. No pneumothorax seen Heart size and mediastinal contours are stable. Again noted is prominence of the right upper paratracheal mediastinum suspicious for mass or  lymphadenopathy. IMPRESSION: 1. Stable chest x-ray. Evaluation again limited by low lung volumes.  Probable bibasilar atelectasis and small left pleural effusion. 2. Persistent prominence of the right paratracheal mediastinum suspicious for mass or lymphadenopathy. Recommend chest CT for further characterization. Electronically Signed   By: Bary Richard M.D.   On: 03/18/2017 23:59   Dg Chest Port 1 View  Result Date: 03/18/2017 CLINICAL DATA:  Acute respiratory failure. Cough and short of breath EXAM: PORTABLE CHEST 1 VIEW COMPARISON:  03/15/2017 FINDINGS: Hypoventilation with interval increase in bibasilar atelectasis. Possible small left effusion Thickened soft tissues in the right paratracheal region unchanged. Possible adenopathy. Negative for heart failure IMPRESSION: Progressive hypoventilation and bibasilar atelectasis Possible mediastinal adenopathy. Recommend CT chest with contrast for further evaluation. Electronically Signed   By: Marlan Palau M.D.   On: 03/18/2017 09:07   Dg C-arm 1-60 Min-no Report  Result Date: 03/15/2017 Fluoroscopy was utilized by the requesting physician.  No radiographic interpretation.     CBC Recent Labs  Lab 03/18/17 0322 03/19/17 0758 03/20/17 0345 03/22/17 0721 03/23/17 0316  WBC 20.8* 29.6* 30.3* 14.9* 14.1*  HGB 9.2* 8.6* 9.5* 8.4* 8.2*  HCT 24.3* 23.3* 26.1* 23.2* 23.0*  PLT 144* 151 207 264 308  MCV 83.5 84.4 85.6 88.2 89.5  MCH 31.6 31.2 31.1 31.9 31.9  MCHC 36.8* 36.9* 36.4* 36.2* 35.7  RDW 12.8 13.2 13.3 13.6 13.6  LYMPHSABS 0.7 0.9  --   --   --   MONOABS 2.2* 2.7*  --   --   --   EOSABS 0.0 0.0  --   --   --   BASOSABS 0.0 0.0  --   --   --     Chemistries  Recent Labs  Lab 03/19/17 0758 03/20/17 0345 03/20/17 1311 03/21/17 0328 03/22/17 0721 03/23/17 0316  NA 120* 128* 128* 132* 132* 132*  K 4.9 4.3 4.4 3.9 3.7 3.7  CL 91* 93* 93* 95* 95* 96*  CO2 21* 24 23 23 28 26   GLUCOSE 104* 121* 89 100* 119* 111*  BUN 12 8 10 9  15 12   CREATININE 0.68 0.57* 0.49* 0.56* 0.62 0.58*  CALCIUM 9.0 9.1 9.1 9.1 8.7* 8.7*  MG  --  1.3*  --   --   --   --   AST 64*  --   --   --   --   --   ALT 35  --   --   --   --   --   ALKPHOS 75  --   --   --   --   --   BILITOT 0.9  --   --   --   --   --    ------------------------------------------------------------------------------------------------------------------ No results for input(s): CHOL, HDL, LDLCALC, TRIG, CHOLHDL, LDLDIRECT in the last 72 hours.  No results found for: HGBA1C ------------------------------------------------------------------------------------------------------------------ No results for input(s): TSH, T4TOTAL, T3FREE, THYROIDAB in the last 72 hours.  Invalid input(s): FREET3 ------------------------------------------------------------------------------------------------------------------ No results for input(s): VITAMINB12, FOLATE, FERRITIN, TIBC, IRON, RETICCTPCT in the last 72 hours.  Coagulation profile No results for input(s): INR, PROTIME in the last 168 hours.  No results for input(s): DDIMER in the last 72 hours.  Cardiac Enzymes No results for input(s): CKMB, TROPONINI, MYOGLOBIN in the last 168 hours.  Invalid input(s): CK ------------------------------------------------------------------------------------------------------------------    Component Value Date/Time   BNP 126.7 (H) 03/21/2017 1114     Adriannah Steinkamp M.D on 03/23/2017 at 5:54 PM  Between 7am to 7pm - Pager - [phone]  After 7pm go to [email or web address] - password [facility]  [facility] -  Office  [phone]   Voice Recognition Reubin Milan dictation system was used to create this note, attempts have been made to correct errors. Please contact the author with questions and/or clarifications.

## 2017-03-23 NOTE — Progress Notes (Signed)
I received report on this patient from the previous RN. I have reviewed and agree with her assessment. I will continue to monitor the patient.

## 2017-03-24 LAB — BASIC METABOLIC PANEL
Anion gap: 10 (ref 5–15)
BUN: 12 mg/dL (ref 6–20)
CALCIUM: 9.1 mg/dL (ref 8.9–10.3)
CO2: 25 mmol/L (ref 22–32)
CREATININE: 0.6 mg/dL — AB (ref 0.61–1.24)
Chloride: 97 mmol/L — ABNORMAL LOW (ref 101–111)
GFR calc non Af Amer: >60 mL/min (ref >60)
Glucose, Bld: 115 mg/dL — ABNORMAL HIGH (ref 65–99)
Potassium: 4.1 mmol/L (ref 3.5–5.1)
SODIUM: 132 mmol/L — AB (ref 135–145)

## 2017-03-24 LAB — URINE CULTURE
CULTURE: NO GROWTH
SPECIAL REQUESTS: NORMAL

## 2017-03-24 LAB — CBC
HCT: 24.1 % — ABNORMAL LOW (ref 39.0–52.0)
Hemoglobin: 8.4 g/dL — ABNORMAL LOW (ref 13.0–17.0)
MCH: 31.6 pg (ref 26.0–34.0)
MCHC: 34.9 g/dL (ref 30.0–36.0)
MCV: 90.6 fL (ref 78.0–100.0)
Platelets: 355 10*3/uL (ref 150–400)
RBC: 2.66 MIL/uL — AB (ref 4.22–5.81)
RDW: 13.9 % (ref 11.5–15.5)
WBC: 15.2 10*3/uL — ABNORMAL HIGH (ref 4.0–10.5)

## 2017-03-24 MED ORDER — VITAMIN B-1 100 MG PO TABS
100.0000 mg | ORAL_TABLET | Freq: Every day | ORAL | Status: DC
  Administered 2017-03-24 – 2017-03-29 (×6): 100 mg via ORAL
  Filled 2017-03-24 (×6): qty 1

## 2017-03-24 MED ORDER — RISPERIDONE 1 MG PO TABS
1.0000 mg | ORAL_TABLET | Freq: Every day | ORAL | Status: DC
  Administered 2017-03-24 – 2017-03-28 (×5): 1 mg via ORAL
  Filled 2017-03-24 (×5): qty 1

## 2017-03-24 MED ORDER — LORAZEPAM 2 MG/ML IJ SOLN
1.0000 mg | Freq: Four times a day (QID) | INTRAMUSCULAR | Status: DC | PRN
  Administered 2017-03-28: 1 mg via INTRAVENOUS
  Filled 2017-03-24: qty 1

## 2017-03-24 MED ORDER — FERROUS SULFATE 325 (65 FE) MG PO TABS
325.0000 mg | ORAL_TABLET | Freq: Two times a day (BID) | ORAL | Status: DC
  Administered 2017-03-24 – 2017-03-29 (×9): 325 mg via ORAL
  Filled 2017-03-24 (×9): qty 1

## 2017-03-24 MED ORDER — IPRATROPIUM-ALBUTEROL 0.5-2.5 (3) MG/3ML IN SOLN: 3.0000 mL | Freq: Four times a day (QID) | RESPIRATORY_TRACT | Status: DC | PRN

## 2017-03-24 NOTE — Progress Notes (Signed)
Patient assessed for suicide risk due to comments made in the previous days. Patient reports he does not have any plans to hurt himself at this time (much less others). Per patient "I will never hurt myself."  Will continue to monitor.

## 2017-03-24 NOTE — NC FL2 (Signed)
[place] MEDICAID FL2 LEVEL OF CARE SCREENING TOOL     IDENTIFICATION  Patient Name: Randy Burgess Birthdate: [birth year] Sex: male Admission Date (Current Location): 03/15/2017  [facility] and [place] Number:  Producer, television/film/video and Address:  [facility],  [address] [place] [address]      Provider Number: [id number]  Attending Physician Name and Address:  Shon Hale, MD  Relative Name and Phone Number:       Current Level of Care: Hospital Recommended Level of Care: Skilled Nursing Facility Prior Approval Number:    Date Approved/Denied: 03/24/17 PASRR Number: [id number]  Discharge Plan: SNF    Current Diagnoses: Patient Active Problem List   Diagnosis Date Noted  . OA (osteoarthritis) of hip 03/15/2017  . H/O total hip arthroplasty 03/15/2017    Orientation RESPIRATION BLADDER Height & Weight     Self  Normal Continent Weight: 179 lb (81.2 kg)(Simultaneous filing. User may not have seen previous data.) Height:  5\' 11"  (180.3 cm)(Simultaneous filing. User may not have seen previous data.)  BEHAVIORAL SYMPTOMS/MOOD NEUROLOGICAL BOWEL NUTRITION STATUS      Continent Diet(See dc Summary)  AMBULATORY STATUS COMMUNICATION OF NEEDS Skin   Extensive Assist Verbally Surgical wounds(Incisions, right hip)                       Personal Care Assistance Level of Assistance  Bathing, Feeding, Dressing Bathing Assistance: Limited assistance Feeding assistance: Independent Dressing Assistance: Limited assistance     Functional Limitations Info  Sight, Speech, Hearing Sight Info: Adequate Hearing Info: Adequate Speech Info: Adequate    SPECIAL CARE FACTORS FREQUENCY  PT (By licensed PT), OT (By licensed OT)     PT Frequency: 5x/week OT Frequency: 5x/week            Contractures Contractures Info: Not present    Additional Factors Info  Code Status, Allergies Code Status Info: FULL Allergies Info:  Penicillins, Sulfa  Antibiotics           Current Medications (03/24/2017):  This is the current hospital active medication list Current Facility-Administered Medications  Medication Dose Route Frequency Provider Last Rate Last Dose  . albuterol (PROVENTIL) (2.5 MG/3ML) 0.083% nebulizer solution 2.5 mg  2.5 mg Nebulization Q4H PRN Max Fickle B, MD      . amLODipine (NORVASC) tablet 10 mg  10 mg Oral Daily Lupita Leash, MD   10 mg at 03/24/17 0953  . benazepril (LOTENSIN) tablet 20 mg  20 mg Oral Daily Max Fickle B, MD   20 mg at 03/24/17 0953  . bisacodyl (DULCOLAX) suppository 10 mg  10 mg Rectal Daily PRN Aluisio, Homero Fellers, MD      . ceFEPIme (MAXIPIME) 2 g in sodium chloride 0.9 % 100 mL IVPB  2 g Intravenous Q8H Glogovac, Nikola, RPH   Stopped at 03/24/17 0600  . chlorhexidine (PERIDEX) 0.12 % solution 15 mL  15 mL Mouth Rinse BID Otho Najjar, MD   15 mL at 03/24/17 0957  . dexmedetomidine (PRECEDEX) 200 MCG/50ML (4 mcg/mL) infusion  0.4-1.2 mcg/kg/hr Intravenous Titrated Max Fickle B, MD      . docusate sodium (COLACE) capsule 100 mg  100 mg Oral BID Ollen Gross, MD   100 mg at 03/22/17 0911  . famotidine (PEPCID) tablet 20 mg  20 mg Oral BID Max Fickle B, MD   20 mg at 03/24/17 0954  . folic acid (FOLVITE) tablet 1 mg  1 mg Oral Daily  Ollen GrossAluisio, Frank, MD   1 mg at 03/24/17 0954  . guaiFENesin (MUCINEX) 12 hr tablet 1,200 mg  1,200 mg Oral BID Max FickleMcQuaid, Douglas B, MD   1,200 mg at 03/24/17 [id number]  . haloperidol lactate (HALDOL) injection 1-4 mg  1-4 mg Intravenous Q3H PRN Lupita LeashMcQuaid, Douglas B, MD      . HYDROcodone-acetaminophen (NORCO/VICODIN) 5-325 MG per tablet 1 tablet  1 tablet Oral Q6H PRN Henry RusselSmith, James, MD   1 tablet at 03/24/17 1026  . ibuprofen (ADVIL,MOTRIN) tablet 400 mg  400 mg Oral Q6H PRN Lupita LeashMcQuaid, Douglas B, MD   400 mg at 03/23/17 2114  . ipratropium-albuterol (DUONEB) 0.5-2.5 (3) MG/3ML nebulizer solution 3 mL  3 mL Nebulization TID Lupita LeashMcQuaid, Douglas B, MD   3 mL  at 03/23/17 2132  . labetalol (NORMODYNE,TRANDATE) injection 10 mg  10 mg Intravenous Q10 min PRN Karl ItoSommer, Steven E, MD   10 mg at 03/20/17 2047  . MEDLINE mouth rinse  15 mL Mouth Rinse q12n4p Otho Najjarosenblatt, Robert J, MD   15 mL at 03/23/17 1601  . menthol-cetylpyridinium (CEPACOL) lozenge 3 mg  1 lozenge Oral PRN Aluisio, Homero FellersFrank, MD       Or  . phenol (CHLORASEPTIC) mouth spray 1 spray  1 spray Mouth/Throat PRN Aluisio, Homero FellersFrank, MD      . nicotine (NICODERM CQ - dosed in mg/24 hours) patch 21 mg  21 mg Transdermal Daily Mannam, Praveen, MD   21 mg at 03/19/17 1101  . ondansetron (ZOFRAN) injection 4 mg  4 mg Intravenous Q6H PRN Aluisio, Homero FellersFrank, MD      . polyethylene glycol (MIRALAX / GLYCOLAX) packet 17 g  17 g Oral Daily PRN Aluisio, Homero FellersFrank, MD      . pseudoephedrine (SUDAFED) tablet 30 mg  30 mg Oral Daily Ollen GrossAluisio, Frank, MD   30 mg at 03/24/17 0953  . risperiDONE (RISPERDAL) tablet 0.5 mg  0.5 mg Oral QHS Shane Crutchamachandran, Pradeep, MD   0.5 mg at 03/23/17 2114  . rivaroxaban (XARELTO) tablet 10 mg  10 mg Oral Q breakfast Ollen GrossAluisio, Frank, MD   10 mg at 03/24/17 [id number]  . sodium chloride (OCEAN) 0.65 % nasal spray 2 spray  2 spray Each Nare PRN Opyd, Lavone Neriimothy S, MD   2 spray at 03/17/17 0200  . sodium phosphate (FLEET) 7-19 GM/118ML enema 1 enema  1 enema Rectal Once PRN Ollen GrossAluisio, Frank, MD         Discharge Medications: Please see discharge summary for a list of discharge medications.  Relevant Imaging Results:  Relevant Lab Results:   Additional Information SSN: [id number]  Coralyn HellingBernette Rj Pedrosa, LCSW

## 2017-03-24 NOTE — Progress Notes (Signed)
Patient Demographics:    Randy Burgess, is a 72 y.o. male, DOB - 1945/09/24, UEA:[id number]  Admit date - 03/15/2017   Admitting Physician Ollen Gross, MD  Outpatient Primary MD for the patient is McGowen, Maryjean Morn, MD  LOS - 9   No chief complaint on file.       Subjective:    Randy Burgess today has no fevers, no emesis,  No chest pain,  Voiding ok , sob is better  Assessment  & Plan :    Principal Problem:   OA (osteoarthritis) of hip Active Problems:   H/O total hip arthroplasty  Brief Summary:- Randy Burgess is a 72 y.o. male who underwent Rt Hip Replacement on 03/15/17, postop developed hyponatremia, delirium, and acute hypoxic respiratory failure, was admitted to ICU on BiPAP , found to have HCAP, blood cultures subsequently grew Pseudomonas, vancomycin discontinued and currently on IV cefepime 2gm q 8 hr since 03/21/2017, transferred to hospitalist service on 03/23/17   Plan:- 1)Pseudomonas HCAP- clinically improving, c/n Oxygen via [place] at 2L/min continue IV cefepime 2 g every 8 hours (since 03/21/17), continue  mucolytics and bronchodilators  2)HFpEF- less sob, patient most likely has acute on chronic diastolic dysfunction CHF (long-standing history of hypertension), last known EF 60-65%, continue diuresis  3)HTN-stable, continue amlodipine 10 mg daily and  benazepril 20 mg daily,  may use IV labetalol when necessary  Every 4 hours for systolic blood pressure over 160 mmhg  4)S/p ORIF of Rt Hip Fx-continue physical therapy, continue Xarelto for DVT prophylaxis,  5)FEN-hypokalemia/hyponatremia-sodium is 132, potassium 3.7, continue to monitor  6)Rt UL Mass-patient will need CT chest for further evaluation when respiratory status improves enough for him to lay flat  7)H/o Etoh and Tobacco Abuse- No DTs, as needed lorazepam, continue folic acid, thiamine and nicotine patch  8)Acute  blood loss anemia- Hgb is 8.4, may related to Rt hip surgery, no evidence of ongoing bleed transfuse if clinically indicated, hemoglobin was 14.8 on February 01, 2017  9)Possible UTI-continue cefepime pending cultures  10)Dispo-awaiting skilled nursing facility placement for rehab  11)Neuropsych-patient with mood swings, no agitation, risperidone 1 mg nightly,    Code Status : Full Code  Disposition Plan  : SNF (PTA lived alone)  Consults  :  Ortho/PCCM  DVT Prophylaxis  :  Xarelto  Lab Results  Component Value Date   PLT 355 03/24/2017    Inpatient Medications  Scheduled Meds: . amLODipine  10 mg Oral Daily  . benazepril  20 mg Oral Daily  . chlorhexidine  15 mL Mouth Rinse BID  . docusate sodium  100 mg Oral BID  . famotidine  20 mg Oral BID  . folic acid  1 mg Oral Daily  . guaiFENesin  1,200 mg Oral BID  . ipratropium-albuterol  3 mL Nebulization TID  . mouth rinse  15 mL Mouth Rinse q12n4p  . nicotine  21 mg Transdermal Daily  . pseudoephedrine  30 mg Oral Daily  . risperiDONE  0.5 mg Oral QHS  . rivaroxaban  10 mg Oral Q breakfast   Continuous Infusions: . ceFEPime (MAXIPIME) IV Stopped (03/24/17 0600)  . dexmedetomidine (PRECEDEX) IV infusion     PRN Meds:.albuterol, bisacodyl, haloperidol lactate, HYDROcodone-acetaminophen, ibuprofen, labetalol, menthol-cetylpyridinium **OR**  phenol, [DISCONTINUED] ondansetron **OR** ondansetron (ZOFRAN) IV, polyethylene glycol, sodium chloride, sodium phosphate    Anti-infectives (From admission, onward)   Start     Dose/Rate Route Frequency Ordered Stop   03/21/17 1500  ceFEPIme (MAXIPIME) 2 g in sodium chloride 0.9 % 100 mL IVPB     2 g 200 mL/hr over 30 Minutes Intravenous Every 8 hours 03/21/17 1426     03/21/17 0200  vancomycin (VANCOCIN) IVPB 1000 mg/200 mL premix  Status:  Discontinued     1,000 mg 200 mL/hr over 60 Minutes Intravenous Every 12 hours 03/20/17 1440 03/21/17 1008   03/20/17 1400  ceFEPIme (MAXIPIME)  1 g in sodium chloride 0.9 % 100 mL IVPB  Status:  Discontinued     1 g 200 mL/hr over 30 Minutes Intravenous Every 8 hours 03/20/17 1304 03/21/17 1425   03/20/17 1400  vancomycin (VANCOCIN) 1,500 mg in sodium chloride 0.9 % 500 mL IVPB     1,500 mg 250 mL/hr over 120 Minutes Intravenous  Once 03/20/17 1304 03/20/17 1724   03/16/17 0030  vancomycin (VANCOCIN) IVPB 1000 mg/200 mL premix     1,000 mg 200 mL/hr over 60 Minutes Intravenous Every 12 hours 03/15/17 1648 03/16/17 1229   03/15/17 0956  vancomycin (VANCOCIN) IVPB 1000 mg/200 mL premix     1,000 mg 200 mL/hr over 60 Minutes Intravenous On call to O.R. 03/15/17 [id number] 03/15/17 1247   03/15/17 0956  vancomycin (VANCOCIN) 1-5 GM/200ML-% IVPB    Comments:  Whitlow, Cheryl   : cabinet override      03/15/17 0956 03/15/17 1147        Objective:   Vitals:   03/24/17 0400 03/24/17 0500 03/24/17 0800 03/24/17 0953  BP: 127/70  (!) 163/69 130/88  Pulse: 84 86 (!) 102   Resp: 20 (!) 21 (!) 23   Temp: 98.4 F (36.9 C)  98.3 F (36.8 C)   TempSrc: Oral  Oral   SpO2: 98% 95% 91%   Weight:      Height:        Wt Readings from Last 3 Encounters:  03/15/17 81.2 kg (179 lb)  02/28/17 81.2 kg (179 lb)  02/08/17 82.8 kg (182 lb 8 oz)     Intake/Output Summary (Last 24 hours) at 03/24/2017 1331 Last data filed at 03/24/2017 0600 Gross per 24 hour  Intake 400 ml  Output 1105 ml  Net -705 ml     Physical Exam  Gen:- Awake Alert,  In no apparent distress  HEENT:- [place].AT, No sclera icterus Nose- [place] 2 L/min Neck-Supple Neck,No JVD,.  Lungs-fair air movement, no wheezing CV- S1, S2 normal Abd-  +ve B.Sounds, Abd Soft, No tenderness,    Extremity/Skin:-Right hip wound clean dry and intact,  psych-cooperative,, moody at times Neuro-no new focal deficits   Data Review:   Micro Results Recent Results (from the past 240 hour(s))  MRSA PCR Screening     Status: None   Collection Time: 03/16/17  6:20 AM  Result Value Ref Range  Status   MRSA by PCR NEGATIVE NEGATIVE Final    Comment:        The GeneXpert MRSA Assay (FDA approved for NASAL specimens only), is one component of a comprehensive MRSA colonization surveillance program. It is not intended to diagnose MRSA infection nor to guide or monitor treatment for MRSA infections. Performed at [facility], [address] [place], [place] [id number]   Culture, blood (Routine X 2) w Reflex to ID  Panel     Status: Abnormal   Collection Time: 03/20/17 12:58 PM  Result Value Ref Range Status   Specimen Description   Final    BLOOD LEFT HAND Performed at [facility], [address] [place], [place] [address]    Special Requests   Final    BOTTLES DRAWN AEROBIC ONLY Blood Culture adequate volume Performed at [facility], [address] [place], [place] [address]    Culture  Setup Time   Final    GRAM NEGATIVE RODS AEROBIC BOTTLE ONLY CRITICAL RESULT CALLED TO, READ BACK BY AND VERIFIED WITH: M. Derrick Ravel Pharm.D. 14:20 03/21/17 ([email or web address]) Performed at [facility], [address] [place], [place] [address]    Culture PSEUDOMONAS AERUGINOSA (A)  Final   Report Status 03/23/2017 FINAL  Final   Organism ID, Bacteria PSEUDOMONAS AERUGINOSA  Final      Susceptibility   Pseudomonas aeruginosa - MIC*    CEFTAZIDIME 4 SENSITIVE Sensitive     CIPROFLOXACIN <=0.25 SENSITIVE Sensitive     GENTAMICIN <=1 SENSITIVE Sensitive     IMIPENEM 1 SENSITIVE Sensitive     PIP/TAZO 8 SENSITIVE Sensitive     CEFEPIME 2 SENSITIVE Sensitive     * PSEUDOMONAS AERUGINOSA  Blood Culture ID Panel (Reflexed)     Status: Abnormal   Collection Time: 03/20/17 12:58 PM  Result Value Ref Range Status   Enterococcus species NOT DETECTED NOT DETECTED Final   Listeria monocytogenes NOT DETECTED NOT DETECTED Final   Staphylococcus species NOT DETECTED NOT DETECTED Final   Staphylococcus aureus NOT DETECTED NOT DETECTED Final    Streptococcus species NOT DETECTED NOT DETECTED Final   Streptococcus agalactiae NOT DETECTED NOT DETECTED Final   Streptococcus pneumoniae NOT DETECTED NOT DETECTED Final   Streptococcus pyogenes NOT DETECTED NOT DETECTED Final   Acinetobacter baumannii NOT DETECTED NOT DETECTED Final   Enterobacteriaceae species NOT DETECTED NOT DETECTED Final   Enterobacter cloacae complex NOT DETECTED NOT DETECTED Final   Escherichia coli NOT DETECTED NOT DETECTED Final   Klebsiella oxytoca NOT DETECTED NOT DETECTED Final   Klebsiella pneumoniae NOT DETECTED NOT DETECTED Final   Proteus species NOT DETECTED NOT DETECTED Final   Serratia marcescens NOT DETECTED NOT DETECTED Final   Carbapenem resistance NOT DETECTED NOT DETECTED Final   Haemophilus influenzae NOT DETECTED NOT DETECTED Final   Neisseria meningitidis NOT DETECTED NOT DETECTED Final   Pseudomonas aeruginosa DETECTED (A) NOT DETECTED Final    Comment: CRITICAL RESULT CALLED TO, READ BACK BY AND VERIFIED WITH: M. Derrick Ravel Pharm.D. 14:20 03/21/17 ([email or web address])    Candida albicans NOT DETECTED NOT DETECTED Final   Candida glabrata NOT DETECTED NOT DETECTED Final   Candida krusei NOT DETECTED NOT DETECTED Final   Candida parapsilosis NOT DETECTED NOT DETECTED Final   Candida tropicalis NOT DETECTED NOT DETECTED Final  Culture, blood (Routine X 2) w Reflex to ID Panel     Status: None (Preliminary result)   Collection Time: 03/20/17  1:11 PM  Result Value Ref Range Status   Specimen Description   Final    BLOOD LEFT HAND Performed at [facility], [address], [place], [place] [address]    Special Requests   Final    BOTTLES DRAWN AEROBIC ONLY Blood Culture adequate volume Performed at [facility], [address] [place], [place] [address]    Culture   Final    NO GROWTH 3 DAYS Performed at [facility], [address], [place], [place] [address]    Report Status PENDING  Incomplete     Radiology Reports X-ray Chest Pa Or Ap  Result Date: 03/15/2017 CLINICAL DATA:  Shortness of breath. EXAM: CHEST 1 VIEW COMPARISON:  None. FINDINGS: The patient is slightly rotated to the left. Normal heart size. Accentuation of the right paratracheal stripe is likely due to rotation. Elevation of the left hemidiaphragm. Bibasilar atelectasis. No focal consolidation, pleural effusion, or pneumothorax. No acute osseous abnormality. IMPRESSION: 1. Accentuation of the right paratracheal stripe is likely due to patient rotation. Recommend dedicated PA and lateral views to exclude underlying adenopathy/mass. 2. Bibasilar atelectasis. Electronically Signed   By: Obie Dredge M.D.   On: 03/15/2017 14:43   Dg Pelvis Portable  Result Date: 03/15/2017 CLINICAL DATA:  Right hip replacement. EXAM: PORTABLE PELVIS 1-2 VIEWS COMPARISON:  None. FINDINGS: AP view of the pelvis demonstrates right hip arthroplasty, without acute hardware complication. Severe left hip osteoarthritis including joint space narrowing and subchondral sclerosis. IMPRESSION: Expected appearance after right hip arthroplasty. Electronically Signed   By: Jeronimo Greaves M.D.   On: 03/15/2017 14:43   Dg Chest Port 1 View  Result Date: 03/23/2017 CLINICAL DATA:  Respiratory failure. EXAM: PORTABLE CHEST 1 VIEW COMPARISON:  03/21/2016.  03/20/2017.  03/18/2017. FINDINGS: Persistent mediastinal fullness. Again adenopathy could present this fashion. Again contrast-enhanced CT of the chest is recommended for further evaluation. Heart size stable. Persistent bibasilar atelectasis/infiltrates. Small left pleural effusion. No change from prior exam. No pneumothorax. IMPRESSION: 1. Persistent mediastinal fullness. Again adenopathy could present this fashion. Again contrast-enhanced CT of the chest is recommended for further evaluation. 2. Low lung volumes with persistent bibasilar atelectasis/infiltrates. Small left pleural effusion. No interim change  from prior exam. Electronically Signed   By: Maisie Fus  Register   On: 03/23/2017 07:05   Dg Chest Port 1 View  Result Date: 03/21/2017 CLINICAL DATA:  Acute respiratory failure.  Hypoxemia. EXAM: PORTABLE CHEST 1 VIEW COMPARISON:  Single-view of the chest 03/15/2017, 03/18/2017 and 03/20/2017. FINDINGS: The patient has a new moderate left pleural effusion and basilar airspace disease. The right lung is clear. There is abnormal soft tissue density and widening of the right paratracheal stripe highly suspicious for lymphadenopathy. Heart size is normal. IMPRESSION: New left effusion and airspace disease worrisome for pneumonia. Findings suspicious for mediastinal lymphadenopathy. CT chest with contrast is recommended for further evaluation. Electronically Signed   By: Drusilla Kanner M.D.   On: 03/21/2017 10:52   Dg Chest Port 1 View  Result Date: 03/20/2017 CLINICAL DATA:  Respiratory failure EXAM: PORTABLE CHEST 1 VIEW COMPARISON:  March 18, 2017. FINDINGS: There is a small left pleural effusion with patchy bibasilar atelectasis. There is mild consolidation in the medial left base. Heart is upper normal in size with pulmonary vascularity within normal limits. There is aortic atherosclerosis. No adenopathy. No bone lesions. IMPRESSION: Bibasilar atelectasis with small pleural effusions. Focal consolidation medial left base, new from most recent study and likely representing focal pneumonia. Stable cardiac silhouette.  There is aortic atherosclerosis. Aortic Atherosclerosis (ICD10-I70.0). Electronically Signed   By: Bretta Bang III M.D.   On: 03/20/2017 07:02   Dg Chest Port 1 View  Result Date: 03/18/2017 CLINICAL DATA:  Acute respiratory failure. History of hypertension, smoker. EXAM: PORTABLE CHEST 1 VIEW COMPARISON:  Chest x-ray from earlier same day and chest x-ray dated 03/15/2017. FINDINGS: Study is again hypoinspiratory with crowding of the perihilar and bibasilar bronchovascular markings.  Persistent bibasilar  opacities, most likely atelectasis. Probable small left pleural effusion with associated blunting of the left costophrenic angle. No new lung findings. No pneumothorax seen Heart size and mediastinal contours are stable. Again noted is prominence of the right upper paratracheal mediastinum suspicious for mass or lymphadenopathy. IMPRESSION: 1. Stable chest x-ray. Evaluation again limited by low lung volumes. Probable bibasilar atelectasis and small left pleural effusion. 2. Persistent prominence of the right paratracheal mediastinum suspicious for mass or lymphadenopathy. Recommend chest CT for further characterization. Electronically Signed   By: Bary Richard M.D.   On: 03/18/2017 23:59   Dg Chest Port 1 View  Result Date: 03/18/2017 CLINICAL DATA:  Acute respiratory failure. Cough and short of breath EXAM: PORTABLE CHEST 1 VIEW COMPARISON:  03/15/2017 FINDINGS: Hypoventilation with interval increase in bibasilar atelectasis. Possible small left effusion Thickened soft tissues in the right paratracheal region unchanged. Possible adenopathy. Negative for heart failure IMPRESSION: Progressive hypoventilation and bibasilar atelectasis Possible mediastinal adenopathy. Recommend CT chest with contrast for further evaluation. Electronically Signed   By: Marlan Palau M.D.   On: 03/18/2017 09:07   Dg C-arm 1-60 Min-no Report  Result Date: 03/15/2017 Fluoroscopy was utilized by the requesting physician.  No radiographic interpretation.     CBC Recent Labs  Lab 03/18/17 0322 03/19/17 0758 03/20/17 0345 03/22/17 0721 03/23/17 0316 03/24/17 0250  WBC 20.8* 29.6* 30.3* 14.9* 14.1* 15.2*  HGB 9.2* 8.6* 9.5* 8.4* 8.2* 8.4*  HCT 24.3* 23.3* 26.1* 23.2* 23.0* 24.1*  PLT 144* 151 207 264 308 355  MCV 83.5 84.4 85.6 88.2 89.5 90.6  MCH 31.6 31.2 31.1 31.9 31.9 31.6  MCHC 36.8* 36.9* 36.4* 36.2* 35.7 34.9  RDW 12.8 13.2 13.3 13.6 13.6 13.9  LYMPHSABS 0.7 0.9  --   --   --   --     MONOABS 2.2* 2.7*  --   --   --   --   EOSABS 0.0 0.0  --   --   --   --   BASOSABS 0.0 0.0  --   --   --   --     Chemistries  Recent Labs  Lab 03/19/17 0758 03/20/17 0345  03/21/17 0328 03/22/17 0721 03/23/17 0316 03/23/17 1858 03/24/17 0250  NA 120* 128*   < > 132* 132* 132* 134* 132*  K 4.9 4.3   < > 3.9 3.7 3.7 4.3 4.1  CL 91* 93*   < > 95* 95* 96* 97* 97*  CO2 21* 24   < > 23 28 26 27 25   GLUCOSE 104* 121*   < > 100* 119* 111* 115* 115*  BUN 12 8   < > 9 15 12 12 12   CREATININE 0.68 0.57*   < > 0.56* 0.62 0.58* 0.61 0.60*  CALCIUM 9.0 9.1   < > 9.1 8.7* 8.7* 9.3 9.1  MG  --  1.3*  --   --   --   --   --   --   AST 64*  --   --   --   --   --  39  --   ALT 35  --   --   --   --   --  40  --   ALKPHOS 75  --   --   --   --   --  84  --   BILITOT 0.9  --   --   --   --   --  0.9  --    < > =  values in this interval not displayed.   ------------------------------------------------------------------------------------------------------------------ No results for input(s): CHOL, HDL, LDLCALC, TRIG, CHOLHDL, LDLDIRECT in the last 72 hours.  No results found for: HGBA1C ------------------------------------------------------------------------------------------------------------------ No results for input(s): TSH, T4TOTAL, T3FREE, THYROIDAB in the last 72 hours.  Invalid input(s): FREET3 ------------------------------------------------------------------------------------------------------------------ No results for input(s): VITAMINB12, FOLATE, FERRITIN, TIBC, IRON, RETICCTPCT in the last 72 hours.  Coagulation profile No results for input(s): INR, PROTIME in the last 168 hours.  No results for input(s): DDIMER in the last 72 hours.  Cardiac Enzymes No results for input(s): CKMB, TROPONINI, MYOGLOBIN in the last 168 hours.  Invalid input(s): CK ------------------------------------------------------------------------------------------------------------------     Component Value Date/Time   BNP 126.7 (H) 03/21/2017 1114     Chessica Audia M.D on 03/24/2017 at 1:31 PM  Between 7am to 7pm - Pager - [phone]  After 7pm go to [email or web address] - password [email or web address]  [facility] -  Office  [phone]   Voice Recognition Reubin Milan dictation system was used to create this note, attempts have been made to correct errors. Please contact the author with questions and/or clarifications.

## 2017-03-24 NOTE — Clinical Social Work Note (Signed)
Clinical Social Work Assessment  Patient Details  Name: Randy Burgess MRN: [id number] Date of Birth: Aug 27, 1945  Date of referral:  03/24/17               Reason for consult:  Facility Placement                Permission sought to share information with:  [facility] Contact Representative Permission granted to share information::  Yes, Verbal Permission Granted  Name::        Agency::     Relationship::     Contact Information:     Housing/Transportation Living arrangements for the past 2 months:  Single Family Home Source of Information:  Patient Patient Interpreter Needed:  None Criminal Activity/Legal Involvement Pertinent to Current Situation/Hospitalization:  No - Comment as needed Significant Relationships:  Friend Lives with:  Self Do you feel safe going back to the place where you live?  Yes(PT recommending SNF) Need for family participation in patient care:  No (Coment)  Care giving concerns:  Patient from home alone.Patient reported that he uses a walker at home and some DME. Patient reported that his friends helps him at home sometimes. PT recommending SNF.   Social Worker assessment / plan:  CSW informed by patient's RNCM that patient is agreeable to SNF and receiving bed offers.  CSW spoke with patient at bedside regarding PT recommendation for SNF and discharge planning. Patient reported that he would prefer to return home with assistance from his friend. CSW asked if CSW could contact patient's friend that would assist in the home, patient declined. CSW explained to patient that the recommendation was for SNF and [facility] Patient agreed and reported that he did want bed offers but didn't want to agree to anything. CSW explained to patient that patient's insurance required prior authorization for SNF, patient verbalized understanding. Patient reported that he [facility]. Patient  emphasized that he didn't want to commit to anything. CSW explained that patient cannot be forced to go to SNF but that he will have that option at discharge if he receives authorization, patient verbalized understanding.   CSW contacted [facility] and spoke with admissions staff member [place]. Staff agreed to start patient's insurance authorization.   CSW will continue to follow and assist with discharge planning.   Employment status:  Retired Network engineernsurance information:  Managed Care PT Recommendations:  Skilled Nursing Facility Information / Referral to community resources:  Skilled Nursing Facility  Patient/Family's Response to care:  Patient appreciative of CSW assistance with discharge planning.   Patient/Family's Understanding of and Emotional Response to Diagnosis, Current Treatment, and Prognosis:  Patient presented apprehensive and guarded. Patient emphasized that he was worried about insurance covering his surgery and hospital stay. CSW informed patient that he would have to contact his insurance company to learn more about his coverage. Patient initially apprehensive about SNF but later agreed that he may have to dc to SNF. Patient verbalized plan to start insurance authorization for SNF as a discharge option. Patient reported that he is feeling better and hopeful to return home.  Emotional Assessment Appearance:  Appears stated age Attitude/Demeanor/Rapport:  Apprehensive, Guarded Affect (typically observed):  Apprehensive Orientation:  Oriented to Self, Oriented to Place, Oriented to  Time, Oriented to Situation Alcohol / Substance use:  Not Applicable Psych involvement (Current and /or in the community):  No (Comment)  Discharge Needs  Concerns to be addressed:  Care Coordination Readmission within the last 30 days:  No Current discharge risk:  Physical Impairment, Lives alone Barriers to Discharge:  Continued Medical Work up   [employer], LCSW 03/24/2017, 2:37  PM

## 2017-03-24 NOTE — Progress Notes (Signed)
Physical Therapy Treatment Patient Details Name: Randy Burgess MRN: [id number] DOB: 02/01/1946 Today's Date: 03/24/2017    History of Present Illness Pt s/p R THR and with post op encephalopathy & respiratory failure    PT Comments    Patient progressing slowly due to limited respiratory reserve.  Seems preoccupied by concerns about hospital bill.  Discussed with POA in the room pt should not go home due to weakness, fall risk, poor activity tolerance and high risk for readmission.  He is in agreement.  Continue to recommend SNF level rehab at d/c.   Follow Up Recommendations  SNF     Equipment Recommendations  None recommended by PT    Recommendations for Other Services       Precautions / Restrictions Precautions Precautions: Fall Precaution Comments: monitor O2 sats Restrictions RLE Weight Bearing: Weight bearing as tolerated    Mobility  Bed Mobility Overal bed mobility: Needs Assistance Bed Mobility: Supine to Sit     Supine to sit: Min assist;HOB elevated     General bed mobility comments: assist for lines, safety  Transfers Overall transfer level: Needs assistance Equipment used: Rolling walker (2 wheeled) Transfers: Sit to/from Stand Sit to Stand: +2 safety/equipment;Mod assist         General transfer comment: lifting assist from EOB with increased time, flexed posture throughout, on 3L O2 SpO2 94%  Ambulation/Gait Ambulation/Gait assistance: Min assist;+2 safety/equipment(for lines, chair) Ambulation Distance (Feet): 25 Feet Assistive device: Rolling walker (2 wheeled) Gait Pattern/deviations: Step-to pattern;Shuffle;Decreased stride length;Trunk flexed     General Gait Details: patient wtih severely flexed posture, reports has been since prior to surgery,  Assist with lines and to retrieve chair, encouraged increased distance to achieve 25'; pt reports was not pushing it since worst pain was after walking before and had severe SOB   Stairs             Wheelchair Mobility    Modified Rankin (Stroke Patients Only)       Balance Overall balance assessment: Needs assistance Sitting-balance support: Feet supported Sitting balance-Leahy Scale: Fair     Standing balance support: Bilateral upper extremity supported Standing balance-Leahy Scale: Poor Standing balance comment: relies on BUE support, forward flexed                            Cognition Arousal/Alertness: Awake/alert Behavior During Therapy: WFL for tasks assessed/performed;Agitated Overall Cognitive Status: Impaired/Different from baseline Area of Impairment: Safety/judgement;Problem solving                       Following Commands: Follows multi-step commands with increased time Safety/Judgement: Decreased awareness of safety;Decreased awareness of deficits   Problem Solving: Requires verbal cues        Exercises      General Comments        Pertinent Vitals/Pain Pain Score: 4  Pain Location: R hip Pain Descriptors / Indicators: Operative site guarding Pain Intervention(s): Monitored during session;Repositioned;Premedicated before session    Home Living                      Prior Function            PT Goals (current goals can now be found in the care plan section) Progress towards PT goals: Progressing toward goals    Frequency    7X/week      PT Plan Current plan remains appropriate  Co-evaluation              AM-PAC PT "6 Clicks" Daily Activity  Outcome Measure  Difficulty turning over in bed (including adjusting bedclothes, sheets and blankets)?: Unable Difficulty moving from lying on back to sitting on the side of the bed? : Unable Difficulty sitting down on and standing up from a chair with arms (e.g., wheelchair, bedside commode, etc,.)?: Unable Help needed moving to and from a bed to chair (including a wheelchair)?: A Lot Help needed walking in hospital room?: A Lot Help  needed climbing 3-5 steps with a railing? : Total 6 Click Score: 8    End of Session Equipment Utilized During Treatment: Gait belt;Oxygen Activity Tolerance: Patient limited by fatigue Patient left: in chair;with call bell/phone within reach;with chair alarm set   PT Visit Diagnosis: Muscle weakness (generalized) (M62.81);Other abnormalities of gait and mobility (R26.89);Pain Pain - Right/Left: Right Pain - part of body: Hip     Time: [id number] PT Time Calculation (min) (ACUTE ONLY): 35 min  Charges:  $Gait Training: 8-22 mins $Therapeutic Activity: 8-22 mins                    G CodesSheran Lawless:       Cyndi Shanterica Biehler, [place]PT [phone] 03/24/2017    Elray Mcgregorynthia Mateja Dier 03/24/2017, 1:40 PM

## 2017-03-24 NOTE — Care Management Note (Signed)
Case Management Note  Patient Details  Name: Randy PickerelKenneth Burgess MRN: [id number] Date of Birth: 05-30-1945  Subjective/Objective:From home alone.Has a supportive friend.Spoke to patient about PT recc-SNF-patient agrees to CSW faxing out info-CSW notified.                    Action/Plan:d/c plan SNF.   Expected Discharge Date:                  Expected Discharge Plan:  Skilled Nursing Facility  In-House Referral:     Discharge planning Services  CM Consult  Post Acute Care Choice:    Choice offered to:     DME Arranged:    DME Agency:     HH Arranged:  [facility]  Status of Service:  In process, will continue to follow  If discussed at Long Length of Stay Meetings, dates discussed:    Additional Comments:  Lanier ClamMahabir, Talal Fritchman, RN 03/24/2017, 12:33 PM

## 2017-03-25 LAB — CULTURE, BLOOD (ROUTINE X 2)
CULTURE: NO GROWTH
SPECIAL REQUESTS: ADEQUATE

## 2017-03-25 NOTE — Progress Notes (Signed)
Patient Demographics:    Randy Burgess, is a 72 y.o. male, DOB - May 30, 1945, XBJ:[id number]  Admit date - 03/15/2017   Admitting Physician Ollen Gross, MD  Outpatient Primary MD for the patient is McGowen, Maryjean Morn, MD  LOS - 10   No chief complaint on file.       Subjective:    Randy Burgess today has no fevers, no emesis,  No chest pain,  Friend/POA Mr Gladstone Pih is at bedside  Assessment  & Plan :    Principal Problem:   OA (osteoarthritis) of hip Active Problems:   H/O total hip arthroplasty  Brief Summary:- Randy Burgess is a 72 y.o. male who underwent Rt Hip Replacement on 03/15/17, postop developed hyponatremia, delirium, and acute hypoxic respiratory failure, was admitted to ICU on BiPAP , found to have HCAP, blood cultures subsequently grew Pseudomonas, vancomycin discontinued and currently on IV cefepime 2gm q 8 hr since 03/21/2017, transferred to hospitalist service on 03/23/17   Plan:- 1)Pseudomonas HCAP-no significant shortness of breath, hypoxia persist, c/n Oxygen via [place] at 2L/min continue IV cefepime 2 g every 8 hours (since 03/21/17), continue  mucolytics and bronchodilators  2)HFpEF- less sob, patient most likely has acute on chronic diastolic dysfunction CHF (long-standing history of hypertension), last known EF 60-65%, voiding well  3)HTN-stable, continue amlodipine 10 mg daily and  benazepril 20 mg daily,  may use IV labetalol when necessary  Every 4 hours for systolic blood pressure over 160 mmhg  4)S/p ORIF of Rt Hip Fx-continue physical therapy, continue Xarelto for DVT prophylaxis,  5)FEN-hypokalemia/hyponatremia-continue to replace and recheck electrolytes  6)Rt UL Mass-patient will need CT chest for further evaluation when respiratory status improves enough for him to lay flat possibly on 03/27/2017 prior to discharge to skilled nursing facility  7)H/o Etoh and Tobacco  Abuse- No DTs, as needed lorazepam, continue folic acid, thiamine and nicotine patch  8)Acute blood loss anemia- Hgb is 8.4, may related to Rt hip surgery, no evidence of ongoing bleed transfuse if clinically indicated, hemoglobin was 14.8 on February 01, 2017  9)Possible UTI-continue cefepime pending cultures  10)Dispo-awaiting skilled nursing facility placement for rehab  11)Neuropsych-patient with mood swings, no agitation, risperidone 1 mg nightly,    Code Status : Full Code  Disposition Plan  : SNF (PTA lived alone)  Consults  :  Ortho/PCCM  DVT Prophylaxis  :  Xarelto  Lab Results  Component Value Date   PLT 355 03/24/2017    Inpatient Medications  Scheduled Meds: . amLODipine  10 mg Oral Daily  . benazepril  20 mg Oral Daily  . chlorhexidine  15 mL Mouth Rinse BID  . docusate sodium  100 mg Oral BID  . famotidine  20 mg Oral BID  . ferrous sulfate  325 mg Oral BID WC  . folic acid  1 mg Oral Daily  . guaiFENesin  1,200 mg Oral BID  . mouth rinse  15 mL Mouth Rinse q12n4p  . nicotine  21 mg Transdermal Daily  . pseudoephedrine  30 mg Oral Daily  . risperiDONE  1 mg Oral QHS  . rivaroxaban  10 mg Oral Q breakfast  . thiamine  100 mg Oral Daily   Continuous Infusions: . ceFEPime (MAXIPIME) IV Stopped (  03/25/17 1421)   PRN Meds:.albuterol, bisacodyl, HYDROcodone-acetaminophen, labetalol, LORazepam, menthol-cetylpyridinium **OR** phenol, [DISCONTINUED] ondansetron **OR** ondansetron (ZOFRAN) IV, polyethylene glycol, sodium chloride, sodium phosphate    Anti-infectives (From admission, onward)   Start     Dose/Rate Route Frequency Ordered Stop   03/21/17 1500  ceFEPIme (MAXIPIME) 2 g in sodium chloride 0.9 % 100 mL IVPB     2 g 200 mL/hr over 30 Minutes Intravenous Every 8 hours 03/21/17 1426     03/21/17 0200  vancomycin (VANCOCIN) IVPB 1000 mg/200 mL premix  Status:  Discontinued     1,000 mg 200 mL/hr over 60 Minutes Intravenous Every 12 hours 03/20/17 1440  03/21/17 1008   03/20/17 1400  ceFEPIme (MAXIPIME) 1 g in sodium chloride 0.9 % 100 mL IVPB  Status:  Discontinued     1 g 200 mL/hr over 30 Minutes Intravenous Every 8 hours 03/20/17 1304 03/21/17 1425   03/20/17 1400  vancomycin (VANCOCIN) 1,500 mg in sodium chloride 0.9 % 500 mL IVPB     1,500 mg 250 mL/hr over 120 Minutes Intravenous  Once 03/20/17 1304 03/20/17 1724   03/16/17 0030  vancomycin (VANCOCIN) IVPB 1000 mg/200 mL premix     1,000 mg 200 mL/hr over 60 Minutes Intravenous Every 12 hours 03/15/17 1648 03/16/17 1229   03/15/17 0956  vancomycin (VANCOCIN) IVPB 1000 mg/200 mL premix     1,000 mg 200 mL/hr over 60 Minutes Intravenous On call to O.R. 03/15/17 1610 03/15/17 1247   03/15/17 0956  vancomycin (VANCOCIN) 1-5 GM/200ML-% IVPB    Comments:  Whitlow, Cheryl   : cabinet override      03/15/17 0956 03/15/17 1147        Objective:   Vitals:   03/24/17 1801 03/24/17 2200 03/25/17 0436 03/25/17 1504  BP: 107/86 125/62 (!) 149/81 123/62  Pulse: (!) 103 92 (!) 106 95  Resp: (!) 22 20 20  (!) 22  Temp: 98.6 F (37 C) 97.9 F (36.6 C) 99.1 F (37.3 C) 99 F (37.2 C)  TempSrc: Oral Oral Oral Oral  SpO2: 99% 99% 94% 97%  Weight:      Height:        Wt Readings from Last 3 Encounters:  03/15/17 81.2 kg (179 lb)  02/28/17 81.2 kg (179 lb)  02/08/17 82.8 kg (182 lb 8 oz)     Intake/Output Summary (Last 24 hours) at 03/25/2017 1744 Last data filed at 03/25/2017 0944 Gross per 24 hour  Intake 310 ml  Output 1275 ml  Net -965 ml     Physical Exam  Gen:- Awake Alert,  In no apparent distress  HEENT:- [place].AT, No sclera icterus Nose- [place] 2 L/min Neck-Supple Neck,No JVD,.  Lungs-fair air movement, no wheezing CV- S1, S2 normal Abd-  +ve B.Sounds, Abd Soft, No tenderness,    Extremity/Skin:-Right hip wound clean dry and intact,  psych-cooperative,, moody at times Neuro-no new focal deficits   Data Review:   Micro Results Recent Results (from the past 240  hour(s))  MRSA PCR Screening     Status: None   Collection Time: 03/16/17  6:20 AM  Result Value Ref Range Status   MRSA by PCR NEGATIVE NEGATIVE Final    Comment:        The GeneXpert MRSA Assay (FDA approved for NASAL specimens only), is one component of a comprehensive MRSA colonization surveillance program. It is not intended to diagnose MRSA infection nor to guide or monitor treatment for MRSA infections. Performed at [facility], [address] [place], [place] [address]   Culture, blood (Routine X 2) w Reflex to ID Panel     Status: Abnormal   Collection Time: 03/20/17 12:58 PM  Result Value Ref Range Status   Specimen Description   Final    BLOOD LEFT HAND Performed at [facility], [address] [place], [place] [address]    Special Requests   Final    BOTTLES DRAWN AEROBIC ONLY Blood Culture adequate volume Performed at [facility], [address], [place], [place] [address]    Culture  Setup Time   Final    GRAM NEGATIVE RODS AEROBIC BOTTLE ONLY CRITICAL RESULT CALLED TO, READ BACK BY AND VERIFIED WITH: M. Derrick Ravel Pharm.D. 14:20 03/21/17 ([email or web address]) Performed at [facility], [address] [place], [place] [address]    Culture PSEUDOMONAS AERUGINOSA (A)  Final   Report Status 03/23/2017 FINAL  Final   Organism ID, Bacteria PSEUDOMONAS AERUGINOSA  Final      Susceptibility   Pseudomonas aeruginosa - MIC*    CEFTAZIDIME 4 SENSITIVE Sensitive     CIPROFLOXACIN <=0.25 SENSITIVE Sensitive     GENTAMICIN <=1 SENSITIVE Sensitive     IMIPENEM 1 SENSITIVE Sensitive     PIP/TAZO 8 SENSITIVE Sensitive     CEFEPIME 2 SENSITIVE Sensitive     * PSEUDOMONAS AERUGINOSA  Blood Culture ID Panel (Reflexed)     Status: Abnormal   Collection Time: 03/20/17 12:58 PM  Result Value Ref Range Status   Enterococcus species NOT DETECTED NOT DETECTED Final   Listeria monocytogenes NOT DETECTED NOT DETECTED Final    Staphylococcus species NOT DETECTED NOT DETECTED Final   Staphylococcus aureus NOT DETECTED NOT DETECTED Final   Streptococcus species NOT DETECTED NOT DETECTED Final   Streptococcus agalactiae NOT DETECTED NOT DETECTED Final   Streptococcus pneumoniae NOT DETECTED NOT DETECTED Final   Streptococcus pyogenes NOT DETECTED NOT DETECTED Final   Acinetobacter baumannii NOT DETECTED NOT DETECTED Final   Enterobacteriaceae species NOT DETECTED NOT DETECTED Final   Enterobacter cloacae complex NOT DETECTED NOT DETECTED Final   Escherichia coli NOT DETECTED NOT DETECTED Final   Klebsiella oxytoca NOT DETECTED NOT DETECTED Final   Klebsiella pneumoniae NOT DETECTED NOT DETECTED Final   Proteus species NOT DETECTED NOT DETECTED Final   Serratia marcescens NOT DETECTED NOT DETECTED Final   Carbapenem resistance NOT DETECTED NOT DETECTED Final   Haemophilus influenzae NOT DETECTED NOT DETECTED Final   Neisseria meningitidis NOT DETECTED NOT DETECTED Final   Pseudomonas aeruginosa DETECTED (A) NOT DETECTED Final    Comment: CRITICAL RESULT CALLED TO, READ BACK BY AND VERIFIED WITH: M. Derrick Ravel Pharm.D. 14:20 03/21/17 ([email or web address])    Candida albicans NOT DETECTED NOT DETECTED Final   Candida glabrata NOT DETECTED NOT DETECTED Final   Candida krusei NOT DETECTED NOT DETECTED Final   Candida parapsilosis NOT DETECTED NOT DETECTED Final   Candida tropicalis NOT DETECTED NOT DETECTED Final  Culture, blood (Routine X 2) w Reflex to ID Panel     Status: None   Collection Time: 03/20/17  1:11 PM  Result Value Ref Range Status   Specimen Description   Final    BLOOD LEFT HAND Performed at [facility], [address] [place], [place] [address]    Special Requests   Final    BOTTLES DRAWN AEROBIC ONLY Blood Culture adequate volume Performed at [facility], [address] [place], [place] [address]  Culture   Final    NO GROWTH 5 DAYS Performed at [facility], [address], [place], [place] [address]    Report Status 03/25/2017 FINAL  Final  Urine Culture     Status: None   Collection Time: 03/23/17  8:30 AM  Result Value Ref Range Status   Specimen Description   Final    URINE, CLEAN CATCH Performed at [facility], [address], [place], [place] [address]    Special Requests   Final    Normal Performed at [facility], [address], [place], [place] [address]    Culture   Final    NO GROWTH Performed at [facility], [address], [place], [place] [address]    Report Status 03/24/2017 FINAL  Final    Radiology Reports X-ray Chest Pa Or Ap  Result Date: 03/15/2017 CLINICAL DATA:  Shortness of breath. EXAM: CHEST 1 VIEW COMPARISON:  None. FINDINGS: The patient is slightly rotated to the left. Normal heart size. Accentuation of the right paratracheal stripe is likely due to rotation. Elevation of the left hemidiaphragm. Bibasilar atelectasis. No focal consolidation, pleural effusion, or pneumothorax. No acute osseous abnormality. IMPRESSION: 1. Accentuation of the right paratracheal stripe is likely due to patient rotation. Recommend dedicated PA and lateral views to exclude underlying adenopathy/mass. 2. Bibasilar atelectasis. Electronically Signed   By: Obie Dredge M.D.   On: 03/15/2017 14:43   Dg Pelvis Portable  Result Date: 03/15/2017 CLINICAL DATA:  Right hip replacement. EXAM: PORTABLE PELVIS 1-2 VIEWS COMPARISON:  None. FINDINGS: AP view of the pelvis demonstrates right hip arthroplasty, without acute hardware complication. Severe left hip osteoarthritis including joint space narrowing and subchondral sclerosis. IMPRESSION: Expected appearance after right hip arthroplasty. Electronically Signed   By: Jeronimo Greaves M.D.   On: 03/15/2017 14:43   Dg Chest Port 1 View  Result Date: 03/23/2017 CLINICAL DATA:  Respiratory failure. EXAM: PORTABLE CHEST 1 VIEW COMPARISON:  03/21/2016.   03/20/2017.  03/18/2017. FINDINGS: Persistent mediastinal fullness. Again adenopathy could present this fashion. Again contrast-enhanced CT of the chest is recommended for further evaluation. Heart size stable. Persistent bibasilar atelectasis/infiltrates. Small left pleural effusion. No change from prior exam. No pneumothorax. IMPRESSION: 1. Persistent mediastinal fullness. Again adenopathy could present this fashion. Again contrast-enhanced CT of the chest is recommended for further evaluation. 2. Low lung volumes with persistent bibasilar atelectasis/infiltrates. Small left pleural effusion. No interim change from prior exam. Electronically Signed   By: Maisie Fus  Register   On: 03/23/2017 07:05   Dg Chest Port 1 View  Result Date: 03/21/2017 CLINICAL DATA:  Acute respiratory failure.  Hypoxemia. EXAM: PORTABLE CHEST 1 VIEW COMPARISON:  Single-view of the chest 03/15/2017, 03/18/2017 and 03/20/2017. FINDINGS: The patient has a new moderate left pleural effusion and basilar airspace disease. The right lung is clear. There is abnormal soft tissue density and widening of the right paratracheal stripe highly suspicious for lymphadenopathy. Heart size is normal. IMPRESSION: New left effusion and airspace disease worrisome for pneumonia. Findings suspicious for mediastinal lymphadenopathy. CT chest with contrast is recommended for further evaluation. Electronically Signed   By: Drusilla Kanner M.D.   On: 03/21/2017 10:52   Dg Chest Port 1 View  Result Date: 03/20/2017 CLINICAL DATA:  Respiratory failure EXAM: PORTABLE CHEST 1 VIEW COMPARISON:  March 18, 2017. FINDINGS: There is a small left pleural effusion with patchy bibasilar atelectasis. There is mild consolidation in the medial left base. Heart  is upper normal in size with pulmonary vascularity within normal limits. There is aortic atherosclerosis. No adenopathy. No bone lesions. IMPRESSION: Bibasilar atelectasis with small pleural effusions. Focal  consolidation medial left base, new from most recent study and likely representing focal pneumonia. Stable cardiac silhouette.  There is aortic atherosclerosis. Aortic Atherosclerosis (ICD10-I70.0). Electronically Signed   By: Bretta Bang III M.D.   On: 03/20/2017 07:02   Dg Chest Port 1 View  Result Date: 03/18/2017 CLINICAL DATA:  Acute respiratory failure. History of hypertension, smoker. EXAM: PORTABLE CHEST 1 VIEW COMPARISON:  Chest x-ray from earlier same day and chest x-ray dated 03/15/2017. FINDINGS: Study is again hypoinspiratory with crowding of the perihilar and bibasilar bronchovascular markings. Persistent bibasilar opacities, most likely atelectasis. Probable small left pleural effusion with associated blunting of the left costophrenic angle. No new lung findings. No pneumothorax seen Heart size and mediastinal contours are stable. Again noted is prominence of the right upper paratracheal mediastinum suspicious for mass or lymphadenopathy. IMPRESSION: 1. Stable chest x-ray. Evaluation again limited by low lung volumes. Probable bibasilar atelectasis and small left pleural effusion. 2. Persistent prominence of the right paratracheal mediastinum suspicious for mass or lymphadenopathy. Recommend chest CT for further characterization. Electronically Signed   By: Bary Richard M.D.   On: 03/18/2017 23:59   Dg Chest Port 1 View  Result Date: 03/18/2017 CLINICAL DATA:  Acute respiratory failure. Cough and short of breath EXAM: PORTABLE CHEST 1 VIEW COMPARISON:  03/15/2017 FINDINGS: Hypoventilation with interval increase in bibasilar atelectasis. Possible small left effusion Thickened soft tissues in the right paratracheal region unchanged. Possible adenopathy. Negative for heart failure IMPRESSION: Progressive hypoventilation and bibasilar atelectasis Possible mediastinal adenopathy. Recommend CT chest with contrast for further evaluation. Electronically Signed   By: Marlan Palau M.D.   On:  03/18/2017 09:07   Dg C-arm 1-60 Min-no Report  Result Date: 03/15/2017 Fluoroscopy was utilized by the requesting physician.  No radiographic interpretation.     CBC Recent Labs  Lab 03/19/17 0758 03/20/17 0345 03/22/17 0721 03/23/17 0316 03/24/17 0250  WBC 29.6* 30.3* 14.9* 14.1* 15.2*  HGB 8.6* 9.5* 8.4* 8.2* 8.4*  HCT 23.3* 26.1* 23.2* 23.0* 24.1*  PLT 151 207 264 308 355  MCV 84.4 85.6 88.2 89.5 90.6  MCH 31.2 31.1 31.9 31.9 31.6  MCHC 36.9* 36.4* 36.2* 35.7 34.9  RDW 13.2 13.3 13.6 13.6 13.9  LYMPHSABS 0.9  --   --   --   --   MONOABS 2.7*  --   --   --   --   EOSABS 0.0  --   --   --   --   BASOSABS 0.0  --   --   --   --     Chemistries  Recent Labs  Lab 03/19/17 0758 03/20/17 0345  03/21/17 0328 03/22/17 0721 03/23/17 0316 03/23/17 1858 03/24/17 0250  NA 120* 128*   < > 132* 132* 132* 134* 132*  K 4.9 4.3   < > 3.9 3.7 3.7 4.3 4.1  CL 91* 93*   < > 95* 95* 96* 97* 97*  CO2 21* 24   < > 23 28 26 27 25   GLUCOSE 104* 121*   < > 100* 119* 111* 115* 115*  BUN 12 8   < > 9 15 12 12 12   CREATININE 0.68 0.57*   < > 0.56* 0.62 0.58* 0.61 0.60*  CALCIUM 9.0 9.1   < > 9.1 8.7* 8.7* 9.3 9.1  MG  --  1.3*  --   --   --   --   --   --   AST 64*  --   --   --   --   --  39  --   ALT 35  --   --   --   --   --  40  --   ALKPHOS 75  --   --   --   --   --  84  --   BILITOT 0.9  --   --   --   --   --  0.9  --    < > = values in this interval not displayed.   ------------------------------------------------------------------------------------------------------------------ No results for input(s): CHOL, HDL, LDLCALC, TRIG, CHOLHDL, LDLDIRECT in the last 72 hours.  No results found for: HGBA1C ------------------------------------------------------------------------------------------------------------------ No results for input(s): TSH, T4TOTAL, T3FREE, THYROIDAB in the last 72 hours.  Invalid input(s):  FREET3 ------------------------------------------------------------------------------------------------------------------ No results for input(s): VITAMINB12, FOLATE, FERRITIN, TIBC, IRON, RETICCTPCT in the last 72 hours.  Coagulation profile No results for input(s): INR, PROTIME in the last 168 hours.  No results for input(s): DDIMER in the last 72 hours.  Cardiac Enzymes No results for input(s): CKMB, TROPONINI, MYOGLOBIN in the last 168 hours.  Invalid input(s): CK ------------------------------------------------------------------------------------------------------------------    Component Value Date/Time   BNP 126.7 (H) 03/21/2017 1114     Tess Potts M.D on 03/25/2017 at 5:44 PM  Between 7am to 7pm - Pager - [phone]  After 7pm go to [email or web address] - password [email or web address]  [facility] -  Office  [phone]   Voice Recognition Reubin Milan/Dragon dictation system was used to create this note, attempts have been made to correct errors. Please contact the author with questions and/or clarifications.

## 2017-03-25 NOTE — Plan of Care (Signed)
  Progressing Health Behavior/Discharge Planning: Ability to manage health-related needs will improve 03/25/2017 2240 - Progressing by Cristela FeltSteffens, Halla Chopp P, RN Clinical Measurements: Ability to maintain clinical measurements within normal limits will improve 03/25/2017 2240 - Progressing by Cristela FeltSteffens, Cathi Hazan P, RN Nutrition: Adequate nutrition will be maintained 03/25/2017 2240 - Progressing by Cristela FeltSteffens, Mei Suits P, RN Pain Managment: General experience of comfort will improve 03/25/2017 2240 - Progressing by Cristela FeltSteffens, Vicent Febles P, RN Safety: Ability to remain free from injury will improve 03/25/2017 2240 - Progressing by Cristela FeltSteffens, Kester Stimpson P, RN

## 2017-03-25 NOTE — Progress Notes (Signed)
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  O2 sats remained >95% with 3L O2.  Follow Up Recommendations  SNF     Equipment Recommendations  None recommended by PT    Recommendations for Other Services       Precautions / Restrictions Precautions Precautions: Fall Precaution Comments: monitor O2 sats Restrictions RLE Weight Bearing: Weight bearing as tolerated Other Position/Activity Restrictions: no hip precautions    Mobility  Bed Mobility Overal bed mobility: Needs Assistance Bed Mobility: Supine to Sit     Supine to sit: Min assist;HOB elevated;+2 for physical assistance     General bed mobility comments: assist for lines, safety.  encouraged pt to scoot himself forward  Transfers Overall transfer level: Needs assistance Equipment used: Rolling walker (2 wheeled) Transfers: Sit to/from Stand Sit to Stand: +2 safety/equipment;Mod assist;From elevated surface         General transfer comment: lifting assist from EOB with increased time, flexed posture throughout, resting o2 on 3L 95% and after walking 20 feet 96% on 3L  Ambulation/Gait Ambulation/Gait assistance: Min assist;+2 physical assistance Ambulation Distance (Feet): 20 Feet Assistive device: Rolling walker (2 wheeled) Gait Pattern/deviations: Step-to pattern;Shuffle;Decreased stride length;Trunk flexed     General Gait Details: patient wtih severely flexed posture, reports has been since prior to surgery,  Assist with lines and to retrieve chair, encouraged increased distance to achieve 20'  tried to get pt to walk further but he would not.  chair pulled up behind him   Information systems managertairs            Wheelchair Mobility    Modified Rankin (Stroke Patients Only)        Balance Overall balance assessment: Needs assistance     Sitting balance - Comments: leans Left in sitting - propped up with pillow in the chiar       Standing balance comment: relies on BUE support, forward flexed leaning fully on short walker                            Cognition Arousal/Alertness: Awake/alert Behavior During Therapy: WFL for tasks assessed/performed;Agitated                                   General Comments: pt very talkative at beginning of session and not very talkative at end. Vitals stable.        Exercises Total Joint Exercises Ankle Circles/Pumps: AROM;Both;10 reps Quad Sets: AROM;10 reps;Both Long Arc Quad: AROM;Right;10 reps;Seated    General Comments        Pertinent Vitals/Pain Pain Assessment: 0-10 Pain Score: (2/10 beginning of PT and 3/10 after walking) Pain Location: R hip Pain Descriptors / Indicators: Operative site guarding Pain Intervention(s): Monitored during session;Repositioned    Home Living                      Prior Function            PT Goals (current goals can now be found in the care plan section) Progress towards PT goals: Progressing toward goals    Frequency    7X/week  PT Plan Current plan remains appropriate(talked to pt - he is not happy about SNF but i think it is best next step for him)    Co-evaluation              AM-PAC PT "6 Clicks" Daily Activity  Outcome Measure  Difficulty turning over in bed (including adjusting bedclothes, sheets and blankets)?: Unable Difficulty moving from lying on back to sitting on the side of the bed? : Unable Difficulty sitting down on and standing up from a chair with arms (e.g., wheelchair, bedside commode, etc,.)?: Unable Help needed moving to and from a bed to chair (including a wheelchair)?: A Lot Help needed walking in hospital room?: A Lot Help needed climbing 3-5 steps with a railing? : Total 6 Click  Score: 8    End of Session Equipment Utilized During Treatment: Gait belt;Oxygen Activity Tolerance: Patient limited by fatigue(pt SOB but O2 sats good with 3L) Patient left: in chair;with call bell/phone within reach;with chair alarm set Nurse Communication: Mobility status PT Visit Diagnosis: Muscle weakness (generalized) (M62.81);Other abnormalities of gait and mobility (R26.89);Pain     Time: 1425-1455 PT Time Calculation (min) (ACUTE ONLY): 30 min  Charges:  $Gait Training: 23-37 mins                    G Codes:       April 03, 2017   Ranae Palms, PT    Judson Roch 03-Apr-2017, 3:11 PM

## 2017-03-25 NOTE — Clinical Social Work Note (Signed)
CSW talked briefly with patient regarding his discharge disposition. Mr. Weyman CroonHubenak requested that CSW talk with his friend [name] and indicated that whatever he say goes. Handoff will be prepared for Sunday CSW  Re: following up with friend.  Genelle BalVanessa Kiamesha Samet, MSW, LCSW Licensed Clinical Social Worker Clinical Social Work Department [facility] [phone]

## 2017-03-26 NOTE — Progress Notes (Addendum)
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 SaO2 95% on 3L O2 with walking, HR 109. Performed RLE exercises with min assist. Pt slowly progressing, SNF recommended. Pt stated he is now agreeable to ST-SNF.    Follow Up Recommendations  SNF     Equipment Recommendations  None recommended by PT    Recommendations for Other Services       Precautions / Restrictions Precautions Precautions: Fall Precaution Comments: monitor O2 sats Restrictions RLE Weight Bearing: Weight bearing as tolerated Other Position/Activity Restrictions: no hip precautions    Mobility  Bed Mobility Overal bed mobility: Needs Assistance Bed Mobility: Supine to Sit     Supine to sit: Min assist;HOB elevated;Mod assist     General bed mobility comments: assist to raise trunk.  encouraged pt to scoot himself forward  Transfers Overall transfer level: Needs assistance Equipment used: Rolling walker (2 wheeled) Transfers: Sit to/from Stand Sit to Stand: +2 safety/equipment;Mod assist;From elevated surface         General transfer comment:  assist to rise with increased time, flexed posture throughout, resting o2 on 3L 95% and after walking 96% on 3L  Ambulation/Gait Ambulation/Gait assistance: Min assist;+2 safety/equipment Ambulation Distance (Feet): 28 Feet(20' + 8' with seated rest) Assistive device: Rolling walker (2 wheeled) Gait Pattern/deviations: Step-to pattern;Shuffle;Decreased stride length;Trunk flexed Gait velocity: decr   General Gait Details: patient wtih severely flexed posture, reports has been since prior to surgery,  Assist with lines and to retrieve chair, 20' + 8' with seated rest.  Distance limited by SOB/fatigue.  SaO2 95% on 3L O2 [place]. chair pulled up behind him   Stairs            Wheelchair Mobility    Modified Rankin (Stroke Patients Only)       Balance Overall balance assessment: Needs assistance     Sitting balance - Comments: leans Left in sitting - propped up with pillow in the chair       Standing balance comment: relies on BUE support, forward flexed leaning fully on short walker                            Cognition Arousal/Alertness: Awake/alert Behavior During Therapy: WFL for tasks assessed/performed Overall Cognitive Status: Within Functional Limits for tasks assessed                                        Exercises Total Joint Exercises Ankle Circles/Pumps: AROM;Both;15 reps;Supine;AAROM Short Arc Quad: AAROM;AROM;Right;Supine;15 reps Heel Slides: AAROM;Right;Supine;20 reps Hip ABduction/ADduction: AAROM;Right;Supine;15 reps    General Comments        Pertinent Vitals/Pain Pain Score: 1  Pain Location: R hip Pain Descriptors / Indicators: Operative site guarding Pain Intervention(s): Limited activity within patient's tolerance;Monitored during session;Premedicated before session    Home Living                      Prior Function            PT Goals (current goals can now be found in the care plan section) Acute Rehab PT  Goals Patient Stated Goal: Go home PT Goal Formulation: With patient Time For Goal Achievement: 01-05-2018 Potential to Achieve Goals: Good Progress towards PT goals: Progressing toward goals    Frequency    7X/week      PT Plan Current plan remains appropriate(talked to pt - he is not happy about SNF but i think it is best next step for him)    Co-evaluation              AM-PAC PT "6 Clicks" Daily Activity  Outcome Measure  Difficulty turning over in bed (including adjusting bedclothes, sheets and blankets)?: Unable Difficulty moving from lying on back to sitting on the side  of the bed? : Unable Difficulty sitting down on and standing up from a chair with arms (e.g., wheelchair, bedside commode, etc,.)?: Unable Help needed moving to and from a bed to chair (including a wheelchair)?: A Lot Help needed walking in hospital room?: A Lot Help needed climbing 3-5 steps with a railing? : Total 6 Click Score: 8    End of Session Equipment Utilized During Treatment: Gait belt;Oxygen Activity Tolerance: Patient limited by fatigue(pt SOB but O2 sats good with 3L) Patient left: in chair;with call bell/phone within reach;with chair alarm set Nurse Communication: Mobility status PT Visit Diagnosis: Muscle weakness (generalized) (M62.81);Other abnormalities of gait and mobility (R26.89);Pain     Time: [id number] PT Time Calculation (min) (ACUTE ONLY): 34 min  Charges:  $Gait Training: 8-22 mins $Therapeutic Exercise: 8-22 mins                    G Codes:          Tamala SerUhlenberg, Aiko Belko Kistler 03/26/2017, 9:37 AM [phone]

## 2017-03-26 NOTE — Progress Notes (Signed)
Patient Demographics:    Randy Burgess, is a 72 y.o. male, DOB - 03-Jan-1946, ZOX:[id number]  Admit date - 03/15/2017   Admitting Physician Ollen Gross, MD  Outpatient Primary MD for the patient is McGowen, Maryjean Morn, MD  LOS - 11   No chief complaint on file.       Subjective:    [name] today has no fevers, no emesis,  Eating well, cooperative  Assessment  & Plan :    Principal Problem:   OA (osteoarthritis) of hip Active Problems:   H/O total hip arthroplasty  Brief Summary:- Randy Burgess is a 72 y.o. male who underwent Rt Hip Replacement on 03/15/17, postop developed hyponatremia, delirium, and acute hypoxic respiratory failure, was admitted to ICU on BiPAP , found to have HCAP, blood cultures subsequently grew Pseudomonas, vancomycin discontinued and currently on IV cefepime 2gm q 8 hr since 03/21/2017, transferred to hospitalist service on 03/23/17, Now awaiting SNF Placement   Plan:- 1)Pseudomonas HCAP-no significant shortness of breath, continues to improve a respiratory standpoint,  However hypoxia persist, c/n Oxygen via [place] at 2L/min continue IV cefepime 2 g every 8 hours (since 03/21/17), continue  mucolytics and bronchodilators  2)HFpEF- less sob, patient most likely has acute on chronic diastolic dysfunction CHF (long-standing history of hypertension), last known EF 60-65%, voiding well, be judicious with IV fluids to avoid volume overload  3)HTN-stable, continue amlodipine 10 mg daily and  benazepril 20 mg daily,  may use IV labetalol when necessary  Every 4 hours for systolic blood pressure over 160 mmhg  4)S/p ORIF of Rt Hip Fx-continue physical therapy, continue Xarelto for DVT prophylaxis,  5)FEN-hypokalemia/hyponatremia-continue to replace and recheck electrolytes  6)Rt UL Mass-patient will need CT chest for further evaluation when respiratory status improves enough for  him to lay flat possibly on 03/27/2017 prior to discharge to skilled nursing facility  7)H/o Etoh and Tobacco Abuse- No DTs, as needed lorazepam, continue folic acid, thiamine and nicotine patch  8)Acute blood loss anemia- Hgb is 8.4, may related to Rt hip surgery, no evidence of ongoing bleed,  transfuse if clinically indicated, hemoglobin was 14.8 on February 01, 2017  9)Possible UTI-urine culture is negative  10)Disposition-patient lives alone prior to admission, he is now weak, debilitated and high risk for falls, discussed with his friend and power of attorney Mr. Gladstone Pih, patient agreeable to skilled nursing facility placement for rehab prior to returning home-awaiting skilled nursing facility placement for rehab  11)Neuropsych-patient with mood swings, no agitation, risperidone 1 mg nightly, for the most part, he  is cooperative   Code Status : Full Code  Disposition Plan  : SNF (PTA lived alone)  Consults  :  Ortho/PCCM  DVT Prophylaxis  :  Xarelto  Lab Results  Component Value Date   PLT 355 03/24/2017    Inpatient Medications  Scheduled Meds: . amLODipine  10 mg Oral Daily  . benazepril  20 mg Oral Daily  . chlorhexidine  15 mL Mouth Rinse BID  . docusate sodium  100 mg Oral BID  . famotidine  20 mg Oral BID  . ferrous sulfate  325 mg Oral BID WC  . folic acid  1 mg Oral Daily  . guaiFENesin  1,200 mg Oral BID  .  mouth rinse  15 mL Mouth Rinse q12n4p  . nicotine  21 mg Transdermal Daily  . pseudoephedrine  30 mg Oral Daily  . risperiDONE  1 mg Oral QHS  . rivaroxaban  10 mg Oral Q breakfast  . thiamine  100 mg Oral Daily   Continuous Infusions: . ceFEPime (MAXIPIME) IV Stopped (03/26/17 1452)   PRN Meds:.albuterol, bisacodyl, HYDROcodone-acetaminophen, labetalol, LORazepam, menthol-cetylpyridinium **OR** phenol, [DISCONTINUED] ondansetron **OR** ondansetron (ZOFRAN) IV, polyethylene glycol, sodium chloride, sodium phosphate    Anti-infectives (From admission,  onward)   Start     Dose/Rate Route Frequency Ordered Stop   03/21/17 1500  ceFEPIme (MAXIPIME) 2 g in sodium chloride 0.9 % 100 mL IVPB     2 g 200 mL/hr over 30 Minutes Intravenous Every 8 hours 03/21/17 1426     03/21/17 0200  vancomycin (VANCOCIN) IVPB 1000 mg/200 mL premix  Status:  Discontinued     1,000 mg 200 mL/hr over 60 Minutes Intravenous Every 12 hours 03/20/17 1440 03/21/17 1008   03/20/17 1400  ceFEPIme (MAXIPIME) 1 g in sodium chloride 0.9 % 100 mL IVPB  Status:  Discontinued     1 g 200 mL/hr over 30 Minutes Intravenous Every 8 hours 03/20/17 1304 03/21/17 1425   03/20/17 1400  vancomycin (VANCOCIN) 1,500 mg in sodium chloride 0.9 % 500 mL IVPB     1,500 mg 250 mL/hr over 120 Minutes Intravenous  Once 03/20/17 1304 03/20/17 1724   03/16/17 0030  vancomycin (VANCOCIN) IVPB 1000 mg/200 mL premix     1,000 mg 200 mL/hr over 60 Minutes Intravenous Every 12 hours 03/15/17 1648 03/16/17 1229   03/15/17 0956  vancomycin (VANCOCIN) IVPB 1000 mg/200 mL premix     1,000 mg 200 mL/hr over 60 Minutes Intravenous On call to O.R. 03/15/17 [id number] 03/15/17 1247   03/15/17 0956  vancomycin (VANCOCIN) 1-5 GM/200ML-% IVPB    Comments:  Whitlow, Cheryl   : cabinet override      03/15/17 0956 03/15/17 1147        Objective:   Vitals:   03/25/17 1504 03/25/17 2147 03/26/17 0638 03/26/17 1320  BP: 123/62 (!) 149/74 133/78 101/62  Pulse: 95 93 98 (!) 101  Resp: (!) 22 18 16 18   Temp: 99 F (37.2 C) 99 F (37.2 C) 97.8 F (36.6 C) 97.6 F (36.4 C)  TempSrc: Oral Oral Oral Oral  SpO2: 97% 93% 98% 96%  Weight:      Height:        Wt Readings from Last 3 Encounters:  03/15/17 81.2 kg (179 lb)  02/28/17 81.2 kg (179 lb)  02/08/17 82.8 kg (182 lb 8 oz)     Intake/Output Summary (Last 24 hours) at 03/26/2017 1907 Last data filed at 03/26/2017 1655 Gross per 24 hour  Intake 1940 ml  Output 1350 ml  Net 590 ml     Physical Exam  Gen:- Awake Alert,  In no apparent distress    HEENT:- [place].AT, No sclera icterus Nose- [place] 2 L/min Neck-Supple Neck,No JVD,.  Lungs-fair air movement, no wheezing CV- S1, S2 normal Abd-  +ve B.Sounds, Abd Soft, No tenderness,    Extremity/Skin:-Right hip wound clean dry and intact,  psych-cooperative,, moody at times Neuro-no new focal deficits   Data Review:   Micro Results Recent Results (from the past 240 hour(s))  Culture, blood (Routine X 2) w Reflex to ID Panel     Status: Abnormal   Collection Time: 03/20/17 12:58 PM  Result Value Ref  Range Status   Specimen Description   Final    BLOOD LEFT HAND Performed at [facility], [address], [place], [place] [address]    Special Requests   Final    BOTTLES DRAWN AEROBIC ONLY Blood Culture adequate volume Performed at [facility], [address] [place], [place] [address]    Culture  Setup Time   Final    GRAM NEGATIVE RODS AEROBIC BOTTLE ONLY CRITICAL RESULT CALLED TO, READ BACK BY AND VERIFIED WITH: M. Derrick Ravel Pharm.D. 14:20 03/21/17 ([email or web address]) Performed at [facility], [address] [place], [place] [address]    Culture PSEUDOMONAS AERUGINOSA (A)  Final   Report Status 03/23/2017 FINAL  Final   Organism ID, Bacteria PSEUDOMONAS AERUGINOSA  Final      Susceptibility   Pseudomonas aeruginosa - MIC*    CEFTAZIDIME 4 SENSITIVE Sensitive     CIPROFLOXACIN <=0.25 SENSITIVE Sensitive     GENTAMICIN <=1 SENSITIVE Sensitive     IMIPENEM 1 SENSITIVE Sensitive     PIP/TAZO 8 SENSITIVE Sensitive     CEFEPIME 2 SENSITIVE Sensitive     * PSEUDOMONAS AERUGINOSA  Blood Culture ID Panel (Reflexed)     Status: Abnormal   Collection Time: 03/20/17 12:58 PM  Result Value Ref Range Status   Enterococcus species NOT DETECTED NOT DETECTED Final   Listeria monocytogenes NOT DETECTED NOT DETECTED Final   Staphylococcus species NOT DETECTED NOT DETECTED Final   Staphylococcus aureus NOT DETECTED NOT DETECTED Final   Streptococcus species  NOT DETECTED NOT DETECTED Final   Streptococcus agalactiae NOT DETECTED NOT DETECTED Final   Streptococcus pneumoniae NOT DETECTED NOT DETECTED Final   Streptococcus pyogenes NOT DETECTED NOT DETECTED Final   Acinetobacter baumannii NOT DETECTED NOT DETECTED Final   Enterobacteriaceae species NOT DETECTED NOT DETECTED Final   Enterobacter cloacae complex NOT DETECTED NOT DETECTED Final   Escherichia coli NOT DETECTED NOT DETECTED Final   Klebsiella oxytoca NOT DETECTED NOT DETECTED Final   Klebsiella pneumoniae NOT DETECTED NOT DETECTED Final   Proteus species NOT DETECTED NOT DETECTED Final   Serratia marcescens NOT DETECTED NOT DETECTED Final   Carbapenem resistance NOT DETECTED NOT DETECTED Final   Haemophilus influenzae NOT DETECTED NOT DETECTED Final   Neisseria meningitidis NOT DETECTED NOT DETECTED Final   Pseudomonas aeruginosa DETECTED (A) NOT DETECTED Final    Comment: CRITICAL RESULT CALLED TO, READ BACK BY AND VERIFIED WITH: M. Derrick Ravel Pharm.D. 14:20 03/21/17 ([email or web address])    Candida albicans NOT DETECTED NOT DETECTED Final   Candida glabrata NOT DETECTED NOT DETECTED Final   Candida krusei NOT DETECTED NOT DETECTED Final   Candida parapsilosis NOT DETECTED NOT DETECTED Final   Candida tropicalis NOT DETECTED NOT DETECTED Final  Culture, blood (Routine X 2) w Reflex to ID Panel     Status: None   Collection Time: 03/20/17  1:11 PM  Result Value Ref Range Status   Specimen Description   Final    BLOOD LEFT HAND Performed at [facility], [address], [place], [place] [address]    Special Requests   Final    BOTTLES DRAWN AEROBIC ONLY Blood Culture adequate volume Performed at [facility], [address] [place], [place] [address]    Culture   Final    NO GROWTH 5 DAYS Performed at [facility], [address] [place], [place] [address]    Report Status 03/25/2017 FINAL  Final  Urine  Culture     Status: None   Collection  Time: 03/23/17  8:30 AM  Result Value Ref Range Status   Specimen Description   Final    URINE, CLEAN CATCH Performed at [facility], [address], [place], [place] [address]    Special Requests   Final    Normal Performed at [facility], [address] [place], [place] [address]    Culture   Final    NO GROWTH Performed at [facility], [address], [place], [place] [address]    Report Status 03/24/2017 FINAL  Final    Radiology Reports X-ray Chest Pa Or Ap  Result Date: 03/15/2017 CLINICAL DATA:  Shortness of breath. EXAM: CHEST 1 VIEW COMPARISON:  None. FINDINGS: The patient is slightly rotated to the left. Normal heart size. Accentuation of the right paratracheal stripe is likely due to rotation. Elevation of the left hemidiaphragm. Bibasilar atelectasis. No focal consolidation, pleural effusion, or pneumothorax. No acute osseous abnormality. IMPRESSION: 1. Accentuation of the right paratracheal stripe is likely due to patient rotation. Recommend dedicated PA and lateral views to exclude underlying adenopathy/mass. 2. Bibasilar atelectasis. Electronically Signed   By: Obie Dredge M.D.   On: 03/15/2017 14:43   Dg Pelvis Portable  Result Date: 03/15/2017 CLINICAL DATA:  Right hip replacement. EXAM: PORTABLE PELVIS 1-2 VIEWS COMPARISON:  None. FINDINGS: AP view of the pelvis demonstrates right hip arthroplasty, without acute hardware complication. Severe left hip osteoarthritis including joint space narrowing and subchondral sclerosis. IMPRESSION: Expected appearance after right hip arthroplasty. Electronically Signed   By: Jeronimo Greaves M.D.   On: 03/15/2017 14:43   Dg Chest Port 1 View  Result Date: 03/23/2017 CLINICAL DATA:  Respiratory failure. EXAM: PORTABLE CHEST 1 VIEW COMPARISON:  03/21/2016.  03/20/2017.  03/18/2017. FINDINGS: Persistent mediastinal fullness. Again adenopathy could present this fashion. Again  contrast-enhanced CT of the chest is recommended for further evaluation. Heart size stable. Persistent bibasilar atelectasis/infiltrates. Small left pleural effusion. No change from prior exam. No pneumothorax. IMPRESSION: 1. Persistent mediastinal fullness. Again adenopathy could present this fashion. Again contrast-enhanced CT of the chest is recommended for further evaluation. 2. Low lung volumes with persistent bibasilar atelectasis/infiltrates. Small left pleural effusion. No interim change from prior exam. Electronically Signed   By: Maisie Fus  Register   On: 03/23/2017 07:05   Dg Chest Port 1 View  Result Date: 03/21/2017 CLINICAL DATA:  Acute respiratory failure.  Hypoxemia. EXAM: PORTABLE CHEST 1 VIEW COMPARISON:  Single-view of the chest 03/15/2017, 03/18/2017 and 03/20/2017. FINDINGS: The patient has a new moderate left pleural effusion and basilar airspace disease. The right lung is clear. There is abnormal soft tissue density and widening of the right paratracheal stripe highly suspicious for lymphadenopathy. Heart size is normal. IMPRESSION: New left effusion and airspace disease worrisome for pneumonia. Findings suspicious for mediastinal lymphadenopathy. CT chest with contrast is recommended for further evaluation. Electronically Signed   By: Drusilla Kanner M.D.   On: 03/21/2017 10:52   Dg Chest Port 1 View  Result Date: 03/20/2017 CLINICAL DATA:  Respiratory failure EXAM: PORTABLE CHEST 1 VIEW COMPARISON:  March 18, 2017. FINDINGS: There is a small left pleural effusion with patchy bibasilar atelectasis. There is mild consolidation in the medial left base. Heart is upper normal in size with pulmonary vascularity within normal limits. There is aortic atherosclerosis. No adenopathy. No bone lesions. IMPRESSION: Bibasilar atelectasis with small pleural effusions. Focal consolidation medial left base, new from most  recent study and likely representing focal pneumonia. Stable cardiac  silhouette.  There is aortic atherosclerosis. Aortic Atherosclerosis (ICD10-I70.0). Electronically Signed   By: Bretta Bang III M.D.   On: 03/20/2017 07:02   Dg Chest Port 1 View  Result Date: 03/18/2017 CLINICAL DATA:  Acute respiratory failure. History of hypertension, smoker. EXAM: PORTABLE CHEST 1 VIEW COMPARISON:  Chest x-ray from earlier same day and chest x-ray dated 03/15/2017. FINDINGS: Study is again hypoinspiratory with crowding of the perihilar and bibasilar bronchovascular markings. Persistent bibasilar opacities, most likely atelectasis. Probable small left pleural effusion with associated blunting of the left costophrenic angle. No new lung findings. No pneumothorax seen Heart size and mediastinal contours are stable. Again noted is prominence of the right upper paratracheal mediastinum suspicious for mass or lymphadenopathy. IMPRESSION: 1. Stable chest x-ray. Evaluation again limited by low lung volumes. Probable bibasilar atelectasis and small left pleural effusion. 2. Persistent prominence of the right paratracheal mediastinum suspicious for mass or lymphadenopathy. Recommend chest CT for further characterization. Electronically Signed   By: Bary Richard M.D.   On: 03/18/2017 23:59   Dg Chest Port 1 View  Result Date: 03/18/2017 CLINICAL DATA:  Acute respiratory failure. Cough and short of breath EXAM: PORTABLE CHEST 1 VIEW COMPARISON:  03/15/2017 FINDINGS: Hypoventilation with interval increase in bibasilar atelectasis. Possible small left effusion Thickened soft tissues in the right paratracheal region unchanged. Possible adenopathy. Negative for heart failure IMPRESSION: Progressive hypoventilation and bibasilar atelectasis Possible mediastinal adenopathy. Recommend CT chest with contrast for further evaluation. Electronically Signed   By: Marlan Palau M.D.   On: 03/18/2017 09:07   Dg C-arm 1-60 Min-no Report  Result Date: 03/15/2017 Fluoroscopy was utilized by the  requesting physician.  No radiographic interpretation.     CBC Recent Labs  Lab 03/20/17 0345 03/22/17 0721 03/23/17 0316 03/24/17 0250  WBC 30.3* 14.9* 14.1* 15.2*  HGB 9.5* 8.4* 8.2* 8.4*  HCT 26.1* 23.2* 23.0* 24.1*  PLT 207 264 308 355  MCV 85.6 88.2 89.5 90.6  MCH 31.1 31.9 31.9 31.6  MCHC 36.4* 36.2* 35.7 34.9  RDW 13.3 13.6 13.6 13.9    Chemistries  Recent Labs  Lab 03/20/17 0345  03/21/17 0328 03/22/17 0721 03/23/17 0316 03/23/17 1858 03/24/17 0250  NA 128*   < > 132* 132* 132* 134* 132*  K 4.3   < > 3.9 3.7 3.7 4.3 4.1  CL 93*   < > 95* 95* 96* 97* 97*  CO2 24   < > 23 28 26 27 25   GLUCOSE 121*   < > 100* 119* 111* 115* 115*  BUN 8   < > 9 15 12 12 12   CREATININE 0.57*   < > 0.56* 0.62 0.58* 0.61 0.60*  CALCIUM 9.1   < > 9.1 8.7* 8.7* 9.3 9.1  MG 1.3*  --   --   --   --   --   --   AST  --   --   --   --   --  39  --   ALT  --   --   --   --   --  40  --   ALKPHOS  --   --   --   --   --  84  --   BILITOT  --   --   --   --   --  0.9  --    < > = values in this interval not displayed.   ------------------------------------------------------------------------------------------------------------------  No results for input(s): CHOL, HDL, LDLCALC, TRIG, CHOLHDL, LDLDIRECT in the last 72 hours.  No results found for: HGBA1C ------------------------------------------------------------------------------------------------------------------ No results for input(s): TSH, T4TOTAL, T3FREE, THYROIDAB in the last 72 hours.  Invalid input(s): FREET3 ------------------------------------------------------------------------------------------------------------------ No results for input(s): VITAMINB12, FOLATE, FERRITIN, TIBC, IRON, RETICCTPCT in the last 72 hours.  Coagulation profile No results for input(s): INR, PROTIME in the last 168 hours.  No results for input(s): DDIMER in the last 72 hours.  Cardiac Enzymes No results for input(s): CKMB, TROPONINI, MYOGLOBIN  in the last 168 hours.  Invalid input(s): CK ------------------------------------------------------------------------------------------------------------------    Component Value Date/Time   BNP 126.7 (H) 03/21/2017 1114     Nesanel Aguila M.D on 03/26/2017 at 7:07 PM  Between 7am to 7pm - Pager - [phone]  After 7pm go to [email or web address] - password [email or web address]  [facility] -  Office  [phone]   Voice Recognition Reubin [place] dictation system was used to create this note, attempts have been made to correct errors. Please contact the author with questions and/or clarifications.

## 2017-03-27 ENCOUNTER — Encounter (HOSPITAL_COMMUNITY): Payer: Self-pay | Admitting: Radiology

## 2017-03-27 ENCOUNTER — Inpatient Hospital Stay (HOSPITAL_COMMUNITY): Payer: Medicare (Managed Care)

## 2017-03-27 ENCOUNTER — Other Ambulatory Visit: Payer: Self-pay | Admitting: Family Medicine

## 2017-03-27 DIAGNOSIS — J9602 Acute respiratory failure with hypercapnia: Secondary | ICD-10-CM

## 2017-03-27 DIAGNOSIS — B965 Pseudomonas (aeruginosa) (mallei) (pseudomallei) as the cause of diseases classified elsewhere: Secondary | ICD-10-CM

## 2017-03-27 DIAGNOSIS — I10 Essential (primary) hypertension: Secondary | ICD-10-CM

## 2017-03-27 DIAGNOSIS — J9601 Acute respiratory failure with hypoxia: Secondary | ICD-10-CM

## 2017-03-27 DIAGNOSIS — G9341 Metabolic encephalopathy: Secondary | ICD-10-CM

## 2017-03-27 DIAGNOSIS — E871 Hypo-osmolality and hyponatremia: Secondary | ICD-10-CM

## 2017-03-27 DIAGNOSIS — I5032 Chronic diastolic (congestive) heart failure: Secondary | ICD-10-CM

## 2017-03-27 DIAGNOSIS — J9859 Other diseases of mediastinum, not elsewhere classified: Secondary | ICD-10-CM

## 2017-03-27 DIAGNOSIS — R7881 Bacteremia: Secondary | ICD-10-CM

## 2017-03-27 LAB — BASIC METABOLIC PANEL
Anion gap: 12 (ref 5–15)
BUN: 13 mg/dL (ref 6–20)
CALCIUM: 9 mg/dL (ref 8.9–10.3)
CO2: 23 mmol/L (ref 22–32)
Chloride: 92 mmol/L — ABNORMAL LOW (ref 101–111)
Creatinine, Ser: 0.66 mg/dL (ref 0.61–1.24)
GFR calc Af Amer: >60 mL/min (ref >60)
GLUCOSE: 126 mg/dL — AB (ref 65–99)
POTASSIUM: 3.9 mmol/L (ref 3.5–5.1)
SODIUM: 127 mmol/L — AB (ref 135–145)

## 2017-03-27 LAB — CBC
HCT: 24.2 % — ABNORMAL LOW (ref 39.0–52.0)
Hemoglobin: 8.4 g/dL — ABNORMAL LOW (ref 13.0–17.0)
MCH: 31.2 pg (ref 26.0–34.0)
MCHC: 34.7 g/dL (ref 30.0–36.0)
MCV: 90 fL (ref 78.0–100.0)
PLATELETS: 337 10*3/uL (ref 150–400)
RBC: 2.69 MIL/uL — AB (ref 4.22–5.81)
RDW: 13.7 % (ref 11.5–15.5)
WBC: 22.2 10*3/uL — AB (ref 4.0–10.5)

## 2017-03-27 MED ORDER — SODIUM CHLORIDE 0.9 % IV SOLN
INTRAVENOUS | Status: DC
  Administered 2017-03-27 – 2017-03-28 (×2): via INTRAVENOUS

## 2017-03-27 NOTE — Progress Notes (Addendum)
PROGRESS NOTE    Randy Burgess  ZOX:[id number] DOB: 1945-03-02 DOA: 03/15/2017 PCP: Jeoffrey Massed, MD     Brief Narrative:  Randy Burgess is a 72 y.o.malewho underwent right hip replacement on 03/15/17, postop developed hyponatremia, delirium, and acute hypoxic respiratory failure, was admitted to ICU on BiPAP, found to have HCAP, blood cultures subsequently grew Pseudomonas, vancomycin discontinued and currently on IV cefepime 2gm q 8 hr since 03/21/2017, transferred to hospitalist service on 03/23/17.  Assessment & Plan:   Principal Problem:   Acute metabolic encephalopathy Active Problems:   OA (osteoarthritis) of hip   H/O total hip arthroplasty   Acute respiratory failure with hypoxia and hypercapnia (HCC)   Bacteremia due to Pseudomonas   Hyponatremia   Chronic diastolic CHF (congestive heart failure) (HCC)   HTN (hypertension)   Mediastinal mass  Acute encephalopathy -Developed encephalopathy post-op ORIF on 2/13. Encephalopathy initially improved with treatment of hyponatremia in ICU, thought perhaps alcohol withdrawal as well and was started on CIWA protocol, although friend states that patient has not had alcohol in over a month  -Stable. Continues to be on risperidone qhs   Acute hypercarbic, hypoxemic respiratory failure -Now off BiPAP -Continue to wean [place] O2 as able   Pseudomonas HCAP with bacteremia -Continue IV cefepime for total 14 days. If discharged before then, can deescalate to cipro -Repeat blood culture negative -Unclear why WBC more elevated today. No fevers. Trend CBC   Hyponatremia  -Serum osmol 241 -Urine osmol 316 -Urine Na 13   Chronic diastolic CHF -EF 81-19%, grade 1 diastolic dysfunction -Stable   HTN -Continue amlodipine 10 mg daily and benazepril 20 mg daily -Stable   S/p ORIF of right hip fx -PT recommending SNF  -Continue Xarelto for DVT prophylaxis  Mediastinal mass -CT chest revealed large mediastinal mass centered  around right paratracheal region, mass effect upon trachea and extending into left paratracheal space, enlarged lymph nodes -Consult pulmonary for recommendations for tissue sampling   Tobacco abuse -Nicotine patch   DVT prophylaxis: Xarelto  Code Status: Full Family Communication: POA at bedside Disposition Plan: SNF    Consultants:   Orthopedic surgery  PCCM   Antimicrobials:  Anti-infectives (From admission, onward)   Start     Dose/Rate Route Frequency Ordered Stop   03/21/17 1500  ceFEPIme (MAXIPIME) 2 g in sodium chloride 0.9 % 100 mL IVPB     2 g 200 mL/hr over 30 Minutes Intravenous Every 8 hours 03/21/17 1426     03/21/17 0200  vancomycin (VANCOCIN) IVPB 1000 mg/200 mL premix  Status:  Discontinued     1,000 mg 200 mL/hr over 60 Minutes Intravenous Every 12 hours 03/20/17 1440 03/21/17 1008   03/20/17 1400  ceFEPIme (MAXIPIME) 1 g in sodium chloride 0.9 % 100 mL IVPB  Status:  Discontinued     1 g 200 mL/hr over 30 Minutes Intravenous Every 8 hours 03/20/17 1304 03/21/17 1425   03/20/17 1400  vancomycin (VANCOCIN) 1,500 mg in sodium chloride 0.9 % 500 mL IVPB     1,500 mg 250 mL/hr over 120 Minutes Intravenous  Once 03/20/17 1304 03/20/17 1724   03/16/17 0030  vancomycin (VANCOCIN) IVPB 1000 mg/200 mL premix     1,000 mg 200 mL/hr over 60 Minutes Intravenous Every 12 hours 03/15/17 1648 03/16/17 1229   03/15/17 0956  vancomycin (VANCOCIN) IVPB 1000 mg/200 mL premix     1,000 mg 200 mL/hr over 60 Minutes Intravenous On call to O.R. 03/15/17 1478 03/15/17 1247  03/15/17 0956  vancomycin (VANCOCIN) 1-5 GM/200ML-% IVPB    Comments:  Curlene Dolphin   : cabinet override      03/15/17 4540 03/15/17 1147       Subjective: Patient has no acute complaints, he is alert and conversational, although he makes slightly inappropriate comments at times.   Objective: Vitals:   03/26/17 2204 03/27/17 0636 03/27/17 1329 03/27/17 1538  BP: 108/79 (!) 142/61 91/61 105/89    Pulse: (!) 101 86 94   Resp: 20 18 18    Temp: 98.2 F (36.8 C) 98 F (36.7 C) 98.5 F (36.9 C)   TempSrc: Oral Oral Oral   SpO2: 100% 96% 94%   Weight:      Height:        Intake/Output Summary (Last 24 hours) at 03/27/2017 1542 Last data filed at 03/27/2017 1300 Gross per 24 hour  Intake 1080 ml  Output 1050 ml  Net 30 ml   Filed Weights   03/15/17 1040  Weight: 81.2 kg (179 lb)    Examination:  General exam: Appears calm and comfortable  Respiratory system: +Rhonchi. Respiratory effort normal. Cardiovascular system: S1 & S2 heard, RRR. No JVD, murmurs, rubs, gallops or clicks. No pedal edema. Gastrointestinal system: Abdomen is nondistended, soft and nontender. No organomegaly or masses felt. Normal bowel sounds heard. Central nervous system: Alert and oriented. No focal neurological deficits. Extremities: Symmetric 5 x 5 power. Skin: No rashes, lesions or ulcers Psychiatry: Judgement and insight appear stable but poor   Data Reviewed: I have personally reviewed following labs and imaging studies  CBC: Recent Labs  Lab 03/22/17 0721 03/23/17 0316 03/24/17 0250 03/27/17 0502  WBC 14.9* 14.1* 15.2* 22.2*  HGB 8.4* 8.2* 8.4* 8.4*  HCT 23.2* 23.0* 24.1* 24.2*  MCV 88.2 89.5 90.6 90.0  PLT 264 308 355 337   Basic Metabolic Panel: Recent Labs  Lab 03/22/17 0721 03/23/17 0316 03/23/17 1858 03/24/17 0250 03/27/17 0502  NA 132* 132* 134* 132* 127*  K 3.7 3.7 4.3 4.1 3.9  CL 95* 96* 97* 97* 92*  CO2 28 26 27 25 23   GLUCOSE 119* 111* 115* 115* 126*  BUN 15 12 12 12 13   CREATININE 0.62 0.58* 0.61 0.60* 0.66  CALCIUM 8.7* 8.7* 9.3 9.1 9.0   GFR: Estimated Creatinine Clearance: 91.5 mL/min (by C-G formula based on SCr of 0.66 mg/dL). Liver Function Tests: Recent Labs  Lab 03/23/17 1858  AST 39  ALT 40  ALKPHOS 84  BILITOT 0.9  PROT 6.8  ALBUMIN 3.4*   No results for input(s): LIPASE, AMYLASE in the last 168 hours. No results for input(s): AMMONIA in  the last 168 hours. Coagulation Profile: No results for input(s): INR, PROTIME in the last 168 hours. Cardiac Enzymes: No results for input(s): CKTOTAL, CKMB, CKMBINDEX, TROPONINI in the last 168 hours. BNP (last 3 results) No results for input(s): PROBNP in the last 8760 hours. HbA1C: No results for input(s): HGBA1C in the last 72 hours. CBG: No results for input(s): GLUCAP in the last 168 hours. Lipid Profile: No results for input(s): CHOL, HDL, LDLCALC, TRIG, CHOLHDL, LDLDIRECT in the last 72 hours. Thyroid Function Tests: No results for input(s): TSH, T4TOTAL, FREET4, T3FREE, THYROIDAB in the last 72 hours. Anemia Panel: No results for input(s): VITAMINB12, FOLATE, FERRITIN, TIBC, IRON, RETICCTPCT in the last 72 hours. Sepsis Labs: No results for input(s): PROCALCITON, LATICACIDVEN in the last 168 hours.  Recent Results (from the past 240 hour(s))  Culture, blood (Routine X 2)  w Reflex to ID Panel     Status: Abnormal   Collection Time: 03/20/17 12:58 PM  Result Value Ref Range Status   Specimen Description   Final    BLOOD LEFT HAND Performed at [facility], [address] [place], [place] [address]    Special Requests   Final    BOTTLES DRAWN AEROBIC ONLY Blood Culture adequate volume Performed at [facility], [address] [place], [place] [address]    Culture  Setup Time   Final    GRAM NEGATIVE RODS AEROBIC BOTTLE ONLY CRITICAL RESULT CALLED TO, READ BACK BY AND VERIFIED WITH: M. Derrick Ravelenz Pharm.D. 14:20 03/21/17 ([email or web address]) Performed at [facility], [address] [place], [place] [address]    Culture PSEUDOMONAS AERUGINOSA (A)  Final   Report Status 03/23/2017 FINAL  Final   Organism ID, Bacteria PSEUDOMONAS AERUGINOSA  Final      Susceptibility   Pseudomonas aeruginosa - MIC*    CEFTAZIDIME 4 SENSITIVE Sensitive     CIPROFLOXACIN <=0.25 SENSITIVE Sensitive     GENTAMICIN <=1 SENSITIVE Sensitive     IMIPENEM 1  SENSITIVE Sensitive     PIP/TAZO 8 SENSITIVE Sensitive     CEFEPIME 2 SENSITIVE Sensitive     * PSEUDOMONAS AERUGINOSA  Blood Culture ID Panel (Reflexed)     Status: Abnormal   Collection Time: 03/20/17 12:58 PM  Result Value Ref Range Status   Enterococcus species NOT DETECTED NOT DETECTED Final   Listeria monocytogenes NOT DETECTED NOT DETECTED Final   Staphylococcus species NOT DETECTED NOT DETECTED Final   Staphylococcus aureus NOT DETECTED NOT DETECTED Final   Streptococcus species NOT DETECTED NOT DETECTED Final   Streptococcus agalactiae NOT DETECTED NOT DETECTED Final   Streptococcus pneumoniae NOT DETECTED NOT DETECTED Final   Streptococcus pyogenes NOT DETECTED NOT DETECTED Final   Acinetobacter baumannii NOT DETECTED NOT DETECTED Final   Enterobacteriaceae species NOT DETECTED NOT DETECTED Final   Enterobacter cloacae complex NOT DETECTED NOT DETECTED Final   Escherichia coli NOT DETECTED NOT DETECTED Final   Klebsiella oxytoca NOT DETECTED NOT DETECTED Final   Klebsiella pneumoniae NOT DETECTED NOT DETECTED Final   Proteus species NOT DETECTED NOT DETECTED Final   Serratia marcescens NOT DETECTED NOT DETECTED Final   Carbapenem resistance NOT DETECTED NOT DETECTED Final   Haemophilus influenzae NOT DETECTED NOT DETECTED Final   Neisseria meningitidis NOT DETECTED NOT DETECTED Final   Pseudomonas aeruginosa DETECTED (A) NOT DETECTED Final    Comment: CRITICAL RESULT CALLED TO, READ BACK BY AND VERIFIED WITH: M. Derrick Ravelenz Pharm.D. 14:20 03/21/17 ([email or web address])    Candida albicans NOT DETECTED NOT DETECTED Final   Candida glabrata NOT DETECTED NOT DETECTED Final   Candida krusei NOT DETECTED NOT DETECTED Final   Candida parapsilosis NOT DETECTED NOT DETECTED Final   Candida tropicalis NOT DETECTED NOT DETECTED Final  Culture, blood (Routine X 2) w Reflex to ID Panel     Status: None   Collection Time: 03/20/17  1:11 PM  Result Value Ref Range Status   Specimen Description   Final      BLOOD LEFT HAND Performed at [facility], [address] [place], [place] [address]    Special Requests   Final    BOTTLES DRAWN AEROBIC ONLY Blood Culture adequate volume Performed at [facility], [address] [place], [place] [address]    Culture   Final    NO GROWTH 5 DAYS  Performed at [facility], [address] [place], [place] [address]    Report Status 03/25/2017 FINAL  Final  Urine Culture     Status: None   Collection Time: 03/23/17  8:30 AM  Result Value Ref Range Status   Specimen Description   Final    URINE, CLEAN CATCH Performed at [facility], [address], [place], [place] [address]    Special Requests   Final    Normal Performed at [facility], [address], [place], [place] [address]    Culture   Final    NO GROWTH Performed at [facility], [address], [place], [place] [address]    Report Status 03/24/2017 FINAL  Final       Radiology Studies: Ct Chest Wo Contrast  Result Date: 03/27/2017 CLINICAL DATA:  Abnormal chest radiograph. Congestion, cough and shortness of breath. EXAM: CT CHEST WITHOUT CONTRAST TECHNIQUE: Multidetector CT imaging of the chest was performed following the standard protocol without IV contrast. COMPARISON:  None. FINDINGS: Cardiovascular: There is moderate cardiac enlargement. Small pericardial effusion. Aortic atherosclerosis identified. Calcifications within the LAD and left circumflex coronary artery identified. Mediastinum/Nodes: Normal appearance of the thyroid gland. Normal appearance of the esophagus. Right supraclavicular lymph node measures 1.8 cm, image 16/2. There is a large mediastinal mass centered around the right paratracheal region measuring 7.1 x 8.1 x 10.1 cm, image 57/2 and image 54/7. The tumor exhibits significant mass effect upon the anterior and left lateral wall of the trachea. Cannot confirm patency of the  SVC due to lack of IV contrast material. Tumor extends across the midline into the left paratracheal region. Enlarged subcarinal node measures 1.5 cm, image 75/2. Left-sided pre-vascular lymph node measures 1.2 cm, image 63/2. Lungs/Pleura: Small bilateral pleural effusions identified. Large segmental areas of airspace consolidation or atelectasis noted within the right middle lobe and right lower lobe. Subsegmental atelectasis within the posterior left upper lobe and left lower lobe identified. Scattered tiny nodules are identified within the right upper lobe. These all measure around 3-4 mm. A few calcified granulomas identified within the right lung. Upper Abdomen: Cyst identified within the medial segment of left lobe of liver measuring 1 cm, image 129/2. No acute abnormality noted within the upper abdomen. Degenerative disc disease identified within the thoracic spine. No aggressive lytic or sclerotic bone lesions. Right lower posterior rib fracture appears subacute to chronic, image 160/2. Musculoskeletal: No chest wall mass or suspicious bone lesions identified. IMPRESSION: 1. Large mediastinal mass centered around the right paratracheal region is identified having mass effect upon the trachea and extending across the midline into the left paratracheal space. Finding is highly worrisome for malignancy. Enlarged left-sided prevascular, subcarinal and right supraclavicular lymph nodes identified. Differential considerations include primary bronchogenic carcinoma versus lymphoproliferative disorder. Further evaluation with PET-CT and tissue sampling is advised. 2. Small pericardial effusion. 3. Bilateral pleural effusions and large segmental areas of airspace consolidation/atelectasis in the right middle lobe and right lower lobe. Subsegmental atelectasis noted within the lingula and left lower lobe. 4. Multiple tiny calcified and noncalcified nodules are identified within the right lung. Nonspecific. 5. Aortic  atherosclerosis with LAD and left circumflex coronary artery atherosclerotic calcifications. Aortic Atherosclerosis (ICD10-I70.0). Electronically Signed   By: Signa Kell M.D.   On: 03/27/2017 11:16      Scheduled Meds: . amLODipine  10 mg Oral Daily  . benazepril  20 mg Oral Daily  . chlorhexidine  15 mL  Mouth Rinse BID  . docusate sodium  100 mg Oral BID  . famotidine  20 mg Oral BID  . ferrous sulfate  325 mg Oral BID WC  . folic acid  1 mg Oral Daily  . guaiFENesin  1,200 mg Oral BID  . mouth rinse  15 mL Mouth Rinse q12n4p  . nicotine  21 mg Transdermal Daily  . pseudoephedrine  30 mg Oral Daily  . risperiDONE  1 mg Oral QHS  . rivaroxaban  10 mg Oral Q breakfast  . thiamine  100 mg Oral Daily   Continuous Infusions: . sodium chloride 50 mL/hr at 03/27/17 1119  . ceFEPime (MAXIPIME) IV 2 g (03/27/17 1402)     LOS: 12 days    Time spent: 40 minutes   Noralee Stain, DO [facility] [email or web address] Password [facility] 03/27/2017, 3:42 PM

## 2017-03-27 NOTE — Progress Notes (Signed)
Request received for image guided right supraclavicular lymph node biopsy on patient.  Imaging studies were reviewed by Dr. Grace IsaacWatts.  He recommends obtaining CT chest abdomen and pelvis with IV contrast prior to performing biopsy.  Above discussed with primary physician.  Continue to hold Xarelto until after biopsy.  We will follow-up after imaging performed. Creat 0.66 today.

## 2017-03-27 NOTE — Progress Notes (Signed)
Provided support at bedside this morning at pt request, RN referral.  Pt not oriented to current condition.  Expressing frustration around fluid restriction.  Provided support at bedside with pt and friend / Linward HeadlandHCPOA, Elias on his arrival.    [place]  / [facility] Chaplain Burnis KingfisherMatthew Joaquina Nissen, MDiv

## 2017-03-27 NOTE — Progress Notes (Signed)
PULMONARY / CRITICAL CARE MEDICINE   Name: Randy Burgess MRN: [id number] DOB: 1945/05/01    ADMISSION DATE:  03/15/2017 CONSULTATION DATE:  03/15/2017  REFERRING MD:  Despina Hick  CHIEF COMPLAINT:  Confusion, hyponatremia  brief 72 y/o male underwent an elective R total hip replacement on 2/13 was brought to the ICU confused, in respiratory failure on BIPAP post operatively.  He was found to have hyponatremia on admission to the ICU.  He has an extensive smoking history. Prolonged ICU course due to delirium.    ANTIBIOTICS: Vanc 2/18 >> 2/19 Cefepime 2/18 >>  SIGNIFICANT EVENTS: 2/13 R hip replacement 2/17 BIPAP 2/18 Sedate early am, improved with holding meds 2/19 On/off bipap, mental status improved  LUE Venous Duplex 2/19 >>  Negative for DVT 2/18 blood >> GNR (aerobic only) >>   2/20 = :  I/O 3450 UOP with lasix, net neg 2.1 L in last 24 hours.  Afebrile.  Pt eating breakfast but states "I can't eat it all".  [place] out of nose and resting on cheek > sats 91-92%.    SUBJECTIVE/OVERNIGHT/INTERVAL HX 2/25- recalled by [facility] after CT chest showed lart right paratracheal mass with supraclavicular extension ion right. [name] is overall doing better. Still on 2 LNC. On xarelto 10mg  for dvt prophylasix. Per Dr Alvino Chapel - confusion improvnig. Discharge on hold due to mass  VITAL SIGNS: BP 105/89 (BP Location: Right Arm)   Pulse 94   Temp 98.5 F (36.9 C) (Oral)   Resp 18   Ht 5\' 11"  (1.803 m) Comment: Simultaneous filing. User may not have seen previous data.  Wt 81.2 kg (179 lb) Comment: Simultaneous filing. User may not have seen previous data.  SpO2 94%   BMI 24.97 kg/m   HEMODYNAMICS:    VENTILATOR SETTINGS:    INTAKE / OUTPUT: I/O last 3 completed shifts: In: 2280 [P.O.:1980; IV Piggyback:300] Out: 2100 [Urine:2100]  PHYSICAL EXAMINATION:  General Appearance:    Sitting In chair. [place]. Mildly obese  Head:    Normocephalic, without obvious abnormality, atraumatic   Eyes:    PERRL - yes, conjunctiva/corneas - clear      Ears:    Normal external ear canals, both ears  Nose:   NG tube - no but has [place] 2L o2  Throat:  ETT TUBE - no , OG tube - no  Neck:   Supple,  No enlargement/tenderness but has HARD R Supracalvicular node +     Lungs:     Clear to auscultation bilaterally,  Chest wall:    No deformity  Heart:    S1 and S2 normal, no murmur, CVP - no.  Pressors - no  Abdomen:     Soft, no masses, no organomegaly  Genitalia:    Not done  Rectal:   not done  Extremities:   Extremities- intact     Skin:   Intact in exposed areas .      Neurologic:   . Moves all 4s - yes. CAM-ICU - neg . Orientation - x3+    LABS:  BMET Recent Labs  Lab 03/23/17 1858 03/24/17 0250 03/27/17 0502  NA 134* 132* 127*  K 4.3 4.1 3.9  CL 97* 97* 92*  CO2 27 25 23   BUN 12 12 13   CREATININE 0.61 0.60* 0.66  GLUCOSE 115* 115* 126*    Electrolytes Recent Labs  Lab 03/23/17 1858 03/24/17 0250 03/27/17 0502  CALCIUM 9.3 9.1 9.0    CBC Recent Labs  Lab 03/23/17 0316  03/24/17 0250 03/27/17 0502  WBC 14.1* 15.2* 22.2*  HGB 8.2* 8.4* 8.4*  HCT 23.0* 24.1* 24.2*  PLT 308 355 337    Coag's No results for input(s): APTT, INR in the last 168 hours.  Sepsis Markers No results for input(s): LATICACIDVEN, PROCALCITON, O2SATVEN in the last 168 hours.  ABG No results for input(s): PHART, PCO2ART, PO2ART in the last 168 hours.  Liver Enzymes Recent Labs  Lab 03/23/17 1858  AST 39  ALT 40  ALKPHOS 84  BILITOT 0.9  ALBUMIN 3.4*    Cardiac Enzymes No results for input(s): TROPONINI, PROBNP in the last 168 hours.  Glucose No results for input(s): GLUCAP in the last 168 hours.  Imaging Ct Chest Wo Contrast  Result Date: 03/27/2017 CLINICAL DATA:  Abnormal chest radiograph. Congestion, cough and shortness of breath. EXAM: CT CHEST WITHOUT CONTRAST TECHNIQUE: Multidetector CT imaging of the chest was performed following the standard protocol  without IV contrast. COMPARISON:  None. FINDINGS: Cardiovascular: There is moderate cardiac enlargement. Small pericardial effusion. Aortic atherosclerosis identified. Calcifications within the LAD and left circumflex coronary artery identified. Mediastinum/Nodes: Normal appearance of the thyroid gland. Normal appearance of the esophagus. Right supraclavicular lymph node measures 1.8 cm, image 16/2. There is a large mediastinal mass centered around the right paratracheal region measuring 7.1 x 8.1 x 10.1 cm, image 57/2 and image 54/7. The tumor exhibits significant mass effect upon the anterior and left lateral wall of the trachea. Cannot confirm patency of the SVC due to lack of IV contrast material. Tumor extends across the midline into the left paratracheal region. Enlarged subcarinal node measures 1.5 cm, image 75/2. Left-sided pre-vascular lymph node measures 1.2 cm, image 63/2. Lungs/Pleura: Small bilateral pleural effusions identified. Large segmental areas of airspace consolidation or atelectasis noted within the right middle lobe and right lower lobe. Subsegmental atelectasis within the posterior left upper lobe and left lower lobe identified. Scattered tiny nodules are identified within the right upper lobe. These all measure around 3-4 mm. A few calcified granulomas identified within the right lung. Upper Abdomen: Cyst identified within the medial segment of left lobe of liver measuring 1 cm, image 129/2. No acute abnormality noted within the upper abdomen. Degenerative disc disease identified within the thoracic spine. No aggressive lytic or sclerotic bone lesions. Right lower posterior rib fracture appears subacute to chronic, image 160/2. Musculoskeletal: No chest wall mass or suspicious bone lesions identified. IMPRESSION: 1. Large mediastinal mass centered around the right paratracheal region is identified having mass effect upon the trachea and extending across the midline into the left paratracheal  space. Finding is highly worrisome for malignancy. Enlarged left-sided prevascular, subcarinal and right supraclavicular lymph nodes identified. Differential considerations include primary bronchogenic carcinoma versus lymphoproliferative disorder. Further evaluation with PET-CT and tissue sampling is advised. 2. Small pericardial effusion. 3. Bilateral pleural effusions and large segmental areas of airspace consolidation/atelectasis in the right middle lobe and right lower lobe. Subsegmental atelectasis noted within the lingula and left lower lobe. 4. Multiple tiny calcified and noncalcified nodules are identified within the right lung. Nonspecific. 5. Aortic atherosclerosis with LAD and left circumflex coronary artery atherosclerotic calcifications. Aortic Atherosclerosis (ICD10-I70.0). Electronically Signed   By: Signa Kell M.D.   On: 03/27/2017 11:16    DISCUSSION: 72 y/o male with a history of EtOH and tobacco abuse here with post operative encephalopathy and hyponatremia after an elective R hip replacement. As of 2/18 he has HCAP.   ASSESSMENT / PLAN:  PULMONARY A: COPD Acute  hypercarbic respiratory failure> improved Acute hypoxemic respiratory failure > persistent 2/19, requiring BIPAP intermittently, suspect volume overload (acute pulmonary edema) R upper lobe mass? 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 HAs small perdicardial effusion as well. Concern for STage 4 NSCLC v extensive stage small cell  Plan  - hold xarelto  - Order IR biiopsy of Rt supraclav node - fastest, easiest - OPD PET scan after above   Patient informed of above plan D/w Dr [name]  Dr. Kalman ShanMurali Yer Olivencia, M.D., [facility].P Pulmonary and Critical Care Medicine Staff Physician, [facility] Center Director - Interstitial Lung Disease  Program  Pulmonary Fibrosis [facility] at  [facility] [place], [place], [address]  Pager: [phone], If no answer or between  15:00h - 7:00h: call [phone] Telephone: [phone]

## 2017-03-27 NOTE — Progress Notes (Signed)
CSW following for assistance with disposition- planning for SNF at [place] for rehab. Pt had selected [facility] from bed offers Friday 03/24/17, facility obtained authorization from [employer].  However, upon updating pt andPOA Gladstone Pih(Elias [phone]) today, CSW was informed per POA that "this facility is unacceptable." CSW explained pt had chosen facility and POA explains "He relies on me to look into things and make the final decision and I looked at it and he will not be going there." Pt/POA stated [facility] and [facility].  [facility] and [place]. However, upon calling POA to update him, he informed CSW that [facility] is actually [facility] preference over [facility].  Reached out to [facility] to investigate this option as [facility] is not generally accepted. Facility states pt has out-of-network benefits therefore they can offer acceptance pending insurance authorization. Noted no private rooms available and pt/POA agreed this is acceptable. CSW encouraged POA to make final selection as insurance authorization often takes more than a day and to have best chance of authorization, solidifying choice is important. PO[facility]. Requested facility initiate insurance auth. Informed prior facilities of change in decision.  Ilean SkillMeghan Vonn Sliger, MSW, LCSW Clinical Social Work 03/27/2017 [phone]

## 2017-03-27 NOTE — Progress Notes (Signed)
Pharmacy Antibiotic Note  Randy PickerelKenneth Burgess is a 72 y.o. male admitted on 03/15/2017 with HCAP.  Pharmacy has been consulted for vancomycin and cefepime dosing. On 2/19, notified of positive blood cultures for Pseudomonas aeruginosa, cefepime dose increased to 2g IV q8h accordingly and vancomycin dc'd.  Today, 03/27/2017 Day # Cefepime 2g IV q8h Afebrile WBC elevated 22.2 and rising SCr stable, CrCl ~90 ml/min Pseudomonas bacteremia - pansensitive  Plan:  Continue cefepime 2g IV q8h - would recommend treatment for pseudomonas bacteremia for 14 days total - could discharge on Cipro if PO warranted, otherwise would just continue cefepime until 14 days complete  Monitor clinical course, renal function   Height: 5\' 11"  (180.3 cm)(Simultaneous filing. User may not have seen previous data.) Weight: 179 lb (81.2 kg)(Simultaneous filing. User may not have seen previous data.) IBW/kg (Calculated) : 75.3  Temp (24hrs), Avg:97.9 F (36.6 C), Min:97.6 F (36.4 C), Max:98.2 F (36.8 C)  Recent Labs  Lab 03/22/17 0721 03/23/17 0316 03/23/17 1858 03/24/17 0250 03/27/17 0502  WBC 14.9* 14.1*  --  15.2* 22.2*  CREATININE 0.62 0.58* 0.61 0.60* 0.66    Estimated Creatinine Clearance: 91.5 mL/min (by C-G formula based on SCr of 0.66 mg/dL).    Allergies  Allergen Reactions  . Penicillins     Childhood allergy Has patient had a PCN reaction causing immediate rash, facial/tongue/throat swelling, SOB or lightheadedness with hypotension: Unknown Has patient had a PCN reaction causing severe rash involving mucus membranes or skin necrosis: Unknown Has patient had a PCN reaction that required hospitalization: Unknown Has patient had a PCN reaction occurring within the last 10 years: No If all of the above answers are "NO", then may proceed with Cephalosporin use.   . Sulfa Antibiotics     Unknown reactions     Antimicrobials this admission:  2/18 vanc >> 2/19 2/18 cefepime >>   Dose  adjustments this admission:  2/19 inc cefepime to 2 gr IV q8h  Microbiology results:  2/18 BCx: 1/2 pseudomonas, no resistance gene 2/14 MRSA PCR: negative  Thank you for allowing pharmacy to be a part of this patient's care.  Hessie KnowsJustin M Kory Panjwani, PharmD, BCPS Pager [phone] 03/27/2017 10:46 AM

## 2017-03-27 NOTE — Progress Notes (Signed)
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 Mod assist for transfers, pt only able to ambulate 3' today 2* to fatiguing quickly. Performed RLE exercises with min assist.     Follow Up Recommendations  SNF     Equipment Recommendations  None recommended by PT    Recommendations for Other Services       Precautions / Restrictions Precautions Precautions: Fall Precaution Comments: monitor O2 sats Restrictions Weight Bearing Restrictions: Yes RLE Weight Bearing: Weight bearing as tolerated Other Position/Activity Restrictions: no hip precautions    Mobility  Bed Mobility Overal bed mobility: Needs Assistance Bed Mobility: Supine to Sit     Supine to sit: HOB elevated;Supervision     General bed mobility comments: VCs to scoot to edge of bed  Transfers Overall transfer level: Needs assistance Equipment used: Rolling walker (2 wheeled) Transfers: Sit to/from [employer] Sit to Stand: +2 safety/equipment;Mod assist;From elevated surface         General transfer comment:  assist to rise with increased time, flexed posture throughout, pt on 2L O2 during activity; sit to stand x 3 trials with VCs for set up positioning each time  Ambulation/Gait Ambulation/Gait assistance: Min assist;+2 safety/equipment Ambulation Distance (Feet): 3 Feet Assistive device: Rolling walker (2 wheeled) Gait Pattern/deviations: Step-to pattern;Shuffle;Decreased stride length;Trunk flexed Gait velocity: decr   General Gait Details: patient wtih severely flexed posture, reports has been since prior to surgery,  pt reported feeling "weak" and that he wasn't able to ambulate farther than 3' today, pt teary at  end of session    Stairs            Wheelchair Mobility    Modified Rankin (Stroke Patients Only)       Balance Overall balance assessment: Needs assistance   Sitting balance-Leahy Scale: Fair     Standing balance support: Bilateral upper extremity supported Standing balance-Leahy Scale: Poor Standing balance comment: relies on BUE support, forward flexed leaning fully on short walker                            Cognition Arousal/Alertness: Awake/alert Behavior During Therapy: WFL for tasks assessed/performed Overall Cognitive Status: Impaired/Different from baseline Area of Impairment: Memory;Problem solving                     Memory: Decreased short-term memory Following Commands: Follows multi-step commands with increased time     Problem Solving: Requires verbal cues General Comments: poor recall of instructions for safety (hand/foot placement for transfers) despite multiple repetitions of transfers      Exercises Total Joint Exercises Ankle Circles/Pumps: AROM;Both;15 reps;Supine;AAROM Short Arc Quad: AROM;Right;Supine;15 reps Heel Slides: AAROM;Right;Supine;20 reps Hip ABduction/ADduction: AAROM;Right;Supine;15 reps    General Comments        Pertinent Vitals/Pain Pain Score: 1  Pain Location: R hip Pain Descriptors / Indicators: Discomfort Pain Intervention(s): Limited activity within patient's tolerance;Monitored during session;Premedicated before session    Home Living                      Prior Function            PT Goals (current goals  can now be found in the care plan section) Acute Rehab PT Goals Patient Stated Goal: Go home PT Goal Formulation: With patient Time For Goal Achievement: 28-Jul-2017 Potential to Achieve Goals: Good Progress towards PT goals: Progressing toward goals    Frequency    7X/week      PT Plan Current plan remains appropriate(talked to pt - he is not happy about SNF but i  think it is best next step for him)    Co-evaluation              AM-PAC PT "6 Clicks" Daily Activity  Outcome Measure  Difficulty turning over in bed (including adjusting bedclothes, sheets and blankets)?: A Little Difficulty moving from lying on back to sitting on the side of the bed? : A Little Difficulty sitting down on and standing up from a chair with arms (e.g., wheelchair, bedside commode, etc,.)?: Unable Help needed moving to and from a bed to chair (including a wheelchair)?: A Lot Help needed walking in hospital room?: A Lot Help needed climbing 3-5 steps with a railing? : Total 6 Click Score: 12    End of Session Equipment Utilized During Treatment: Gait belt;Oxygen Activity Tolerance: Patient limited by fatigue(pt SOB but O2 sats good with 3L) Patient left: in chair;with call bell/phone within reach;with chair alarm set Nurse Communication: Mobility status(decreased activity tolerance today, condom cath off) PT Visit Diagnosis: Muscle weakness (generalized) (M62.81);Other abnormalities of gait and mobility (R26.89);Pain Pain - Right/Left: Right Pain - part of body: Hip     Time: [id number] PT Time Calculation (min) (ACUTE ONLY): 32 min  Charges:  $Therapeutic Exercise: 8-22 mins $Therapeutic Activity: 8-22 mins                    G Codes:          Tamala SerUhlenberg, Melika Reder Kistler 03/27/2017, 12:43 PM [phone]

## 2017-03-28 ENCOUNTER — Inpatient Hospital Stay (HOSPITAL_COMMUNITY): Payer: Medicare (Managed Care)

## 2017-03-28 ENCOUNTER — Encounter (HOSPITAL_COMMUNITY): Payer: Self-pay | Admitting: Radiology

## 2017-03-28 LAB — BASIC METABOLIC PANEL
ANION GAP: 9 (ref 5–15)
BUN: 13 mg/dL (ref 6–20)
CHLORIDE: 92 mmol/L — AB (ref 101–111)
CO2: 26 mmol/L (ref 22–32)
Calcium: 9.1 mg/dL (ref 8.9–10.3)
Creatinine, Ser: 0.62 mg/dL (ref 0.61–1.24)
GFR calc Af Amer: >60 mL/min (ref >60)
GFR calc non Af Amer: >60 mL/min (ref >60)
GLUCOSE: 101 mg/dL — AB (ref 65–99)
POTASSIUM: 3.9 mmol/L (ref 3.5–5.1)
Sodium: 127 mmol/L — ABNORMAL LOW (ref 135–145)

## 2017-03-28 LAB — CBC
HEMATOCRIT: 23.8 % — AB (ref 39.0–52.0)
HEMOGLOBIN: 8.2 g/dL — AB (ref 13.0–17.0)
MCH: 31.3 pg (ref 26.0–34.0)
MCHC: 34.5 g/dL (ref 30.0–36.0)
MCV: 90.8 fL (ref 78.0–100.0)
Platelets: 322 10*3/uL (ref 150–400)
RBC: 2.62 MIL/uL — AB (ref 4.22–5.81)
RDW: 13.6 % (ref 11.5–15.5)
WBC: 16.4 10*3/uL — AB (ref 4.0–10.5)

## 2017-03-28 LAB — PROTIME-INR
INR: 1.17
Prothrombin Time: 14.8 seconds (ref 11.4–15.2)

## 2017-03-28 MED ORDER — IOPAMIDOL (ISOVUE-300) INJECTION 61%
INTRAVENOUS | Status: AC
  Filled 2017-03-28: qty 100

## 2017-03-28 MED ORDER — SODIUM CHLORIDE 0.9 % IJ SOLN
INTRAMUSCULAR | Status: AC
  Administered 2017-03-28: 16:00:00
  Filled 2017-03-28: qty 50

## 2017-03-28 MED ORDER — IOPAMIDOL (ISOVUE-300) INJECTION 61%
INTRAVENOUS | Status: AC
  Filled 2017-03-28: qty 30

## 2017-03-28 MED ORDER — IOPAMIDOL (ISOVUE-300) INJECTION 61%: 15.0000 mL | Freq: Once | INTRAVENOUS | Status: DC | PRN

## 2017-03-28 MED ORDER — IOPAMIDOL (ISOVUE-300) INJECTION 61%
100.0000 mL | Freq: Once | INTRAVENOUS | Status: AC | PRN
  Administered 2017-03-28: 100 mL via INTRAVENOUS

## 2017-03-28 NOTE — Progress Notes (Signed)
Pt and POA have selected [facility]  for SNF- facility initiated insurance auth request and confirmed pt has out of network benefits.  Will follow and assist.  Ilean SkillMeghan Clovis Mankins, MSW, LCSW Clinical Social Work 03/28/2017 [phone]

## 2017-03-28 NOTE — Progress Notes (Signed)
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  Feel he will need SNF level rehab at d/c.  Will continue skilled PT during acute stay.   Follow Up Recommendations  SNF     Equipment Recommendations  None recommended by PT    Recommendations for Other Services       Precautions / Restrictions Precautions Precautions: Fall Precaution Comments: monitor O2 sats Restrictions RLE Weight Bearing: Weight bearing as tolerated    Mobility  Bed Mobility Overal bed mobility: Needs Assistance Bed Mobility: Supine to Sit     Supine to sit: HOB elevated;Mod assist;+2 for physical assistance Sit to supine: Mod assist;+2 for physical assistance   General bed mobility comments: scooting out to EOB with assist, guided legs off bed and lifted trunk upright; to supine assist for lfeet and trunk  Transfers Overall transfer level: Needs assistance   Transfers: Lateral/Scoot Transfers          Lateral/Scoot Transfers: +2 physical assistance;Max assist General transfer comment: unsafe to try to stand this session due to pt with collapsed trunk and poor UE use for balance at EOB, used pad on bed to assist to scoot up to [facility]  Ambulation/Gait                 Stairs            Wheelchair Mobility    Modified Rankin (Stroke Patients Only)       Balance Overall balance assessment: Needs assistance Sitting-balance support: Feet supported Sitting balance-Leahy Scale: Poor Sitting balance - Comments: flopped over with chest resting almost on pelvis and mod cues for head righting.                                     Cognition  Arousal/Alertness: Awake/alert Behavior During Therapy: Agitated;Anxious Overall Cognitive Status: Impaired/Different from baseline Area of Impairment: Attention;Following commands;Safety/judgement                   Current Attention Level: Sustained Memory: Decreased short-term memory Following Commands: Follows one step commands with increased time;Follows one step commands inconsistently Safety/Judgement: Decreased awareness of safety;Decreased awareness of deficits   Problem Solving: Decreased initiation;Slow processing        Exercises Total Joint Exercises Ankle Circles/Pumps: AROM;Both;Supine;AAROM;10 reps Short Arc Quad: Strengthening;Both;Supine;5 reps Heel Slides: AROM;AAROM;5 reps;Both;Supine    General Comments        Pertinent Vitals/Pain Faces Pain Scale: Hurts a little bit Pain Location: R hip Pain Descriptors / Indicators: Sore Pain Intervention(s): Monitored during session;Repositioned    Home Living                      Prior Function            PT Goals (current goals can now be found in the care plan section) Progress towards PT goals: Not progressing toward goals - comment    Frequency    7X/week      PT Plan Current plan remains appropriate    Co-evaluation              AM-PAC  PT "6 Clicks" Daily Activity  Outcome Measure  Difficulty turning over in bed (including adjusting bedclothes, sheets and blankets)?: Unable Difficulty moving from lying on back to sitting on the side of the bed? : Unable Difficulty sitting down on and standing up from a chair with arms (e.g., wheelchair, bedside commode, etc,.)?: Unable Help needed moving to and from a bed to chair (including a wheelchair)?: Total Help needed walking in hospital room?: Total Help needed climbing 3-5 steps with a railing? : Total 6 Click Score: 6    End of Session Equipment Utilized During Treatment: Oxygen Activity Tolerance: Patient limited by  fatigue Patient left: in bed;with call bell/phone within reach;with bed alarm set Nurse Communication: Other (comment)(need for checks due to pulling on condom cath) PT Visit Diagnosis: Muscle weakness (generalized) (M62.81);Other abnormalities of gait and mobility (R26.89);Pain Pain - Right/Left: Right Pain - part of body: Hip     Time: [id number] PT Time Calculation (min) (ACUTE ONLY): 16 min  Charges:  $Therapeutic Activity: 8-22 mins                    G CodesSheran Lawless:       Cyndi Wynn, [place] [phone] 03/28/2017    Elray Mcgregorynthia Wynn 03/28/2017, 3:14 PM

## 2017-03-28 NOTE — Progress Notes (Signed)
Patient ID: Fritz PickerelKenneth Bibbee, male   DOB: Jan 07, 1946, 72 y.o.   MRN: [id number] Based on review of latest images pt is candidate for rt supraclavicular lymph node biopsy; however pt's POA, Mr. Leticia ClasRivera, would like to speak with pt again later this evening to confirm whether or not pt is willing to undergo bx; will cont to monitor and schedule if all parties are in agreement.

## 2017-03-28 NOTE — Progress Notes (Signed)
PROGRESS NOTE    Randy Burgess  ZOX:[id number] DOB: 05-22-1945 DOA: 03/15/2017 PCP: Jeoffrey Massed, MD     Brief Narrative:  Randy Burgess is a 72 y.o.malewho underwent right hip replacement on 03/15/17, postop developed hyponatremia, delirium, and acute hypoxic respiratory failure, was admitted to ICU on BiPAP, found to have HCAP, blood cultures subsequently grew Pseudomonas, vancomycin discontinued and currently on IV cefepime 2gm q 8 hr since 03/21/2017, transferred to hospitalist service on 03/23/17.  Assessment & Plan:   Principal Problem:   Acute metabolic encephalopathy Active Problems:   OA (osteoarthritis) of hip   H/O total hip arthroplasty   Acute respiratory failure with hypoxia and hypercapnia (HCC)   Bacteremia due to Pseudomonas   Hyponatremia   Chronic diastolic CHF (congestive heart failure) (HCC)   HTN (hypertension)   Mediastinal mass  Acute encephalopathy -Developed encephalopathy post-op ORIF on 2/13. Encephalopathy initially improved with treatment of hyponatremia in ICU, thought perhaps alcohol withdrawal as well and was started on CIWA protocol, although friend states that patient has not had alcohol in over a month  -Continues to be on risperidone qhs  -Received IV ativan this morning due to anxiety and patient very lethargic this morning. Will stop benzo and watch improvement   Acute hypercarbic, hypoxemic respiratory failure -Now off BiPAP -Continue to wean [place] O2 as able   Pseudomonas HCAP with bacteremia -Continue IV cefepime for total 14 days. If discharged before then, can deescalate to cipro -Repeat blood culture negative -WBC improved   Hyponatremia  -Serum osmol 241 -Urine osmol 316 -Urine Na 13  -Initially thought to be secondary to SIADH? However urine Na only 13. Monitor BMP   Chronic diastolic CHF -EF 81-19%, grade 1 diastolic dysfunction -Stable   HTN -Continue amlodipine 10 mg daily and benazepril 20 mg daily -Stable    S/p ORIF of right hip fx -PT recommending SNF  -Has been on Xarelto for DVT prophylaxis, but holding for now pending biopsy   Mediastinal mass -CT chest revealed large mediastinal mass centered around right paratracheal region, mass effect upon trachea and extending into left paratracheal space, enlarged lymph nodes -Consult pulmonary for recommendations for tissue sampling  -IR consulted for supraclavicular node biopsy, although POA/friend wants to discuss with patient later today prior to undergoing biopsy. Per POA, in the past, patient has not wanted to undergo biopsy so he wants to make sure patient is now agreeable   Tobacco abuse -Nicotine patch    DVT prophylaxis: Xarelto  Code Status: Full Family Communication: POA at bedside Disposition Plan: SNF    Consultants:   Orthopedic surgery  PCCM   Antimicrobials:  Anti-infectives (From admission, onward)   Start     Dose/Rate Route Frequency Ordered Stop   03/21/17 1500  ceFEPIme (MAXIPIME) 2 g in sodium chloride 0.9 % 100 mL IVPB     2 g 200 mL/hr over 30 Minutes Intravenous Every 8 hours 03/21/17 1426     03/21/17 0200  vancomycin (VANCOCIN) IVPB 1000 mg/200 mL premix  Status:  Discontinued     1,000 mg 200 mL/hr over 60 Minutes Intravenous Every 12 hours 03/20/17 1440 03/21/17 1008   03/20/17 1400  ceFEPIme (MAXIPIME) 1 g in sodium chloride 0.9 % 100 mL IVPB  Status:  Discontinued     1 g 200 mL/hr over 30 Minutes Intravenous Every 8 hours 03/20/17 1304 03/21/17 1425   03/20/17 1400  vancomycin (VANCOCIN) 1,500 mg in sodium chloride 0.9 % 500 mL IVPB  1,500 mg 250 mL/hr over 120 Minutes Intravenous  Once 03/20/17 1304 03/20/17 1724   03/16/17 0030  vancomycin (VANCOCIN) IVPB 1000 mg/200 mL premix     1,000 mg 200 mL/hr over 60 Minutes Intravenous Every 12 hours 03/15/17 1648 03/16/17 1229   03/15/17 0956  vancomycin (VANCOCIN) IVPB 1000 mg/200 mL premix     1,000 mg 200 mL/hr over 60 Minutes Intravenous On  call to O.R. 03/15/17 0956 03/15/17 1247   03/15/17 0956  vancomycin (VANCOCIN) 1-5 GM/200ML-% IVPB    Comments:  Curlene DolphinWhitlow, Cheryl   : cabinet override      03/15/17 [id number] 03/15/17 1147       Subjective: Patient very lethargic this morning. Per RN, he was anxious and was given IV ativan and has been sleepy since.   Objective: Vitals:   03/27/17 0636 03/27/17 1329 03/27/17 1538 03/28/17 0500  BP: (!) 142/61 91/61 105/89 (!) 142/70  Pulse: 86 94  100  Resp: 18 18  20   Temp: 98 F (36.7 C) 98.5 F (36.9 C)  98.2 F (36.8 C)  TempSrc: Oral Oral  Oral  SpO2: 96% 94%  94%  Weight:      Height:        Intake/Output Summary (Last 24 hours) at 03/28/2017 1232 Last data filed at 03/28/2017 0902 Gross per 24 hour  Intake 1884.17 ml  Output 850 ml  Net 1034.17 ml   Filed Weights   03/15/17 1040  Weight: 81.2 kg (179 lb)    Examination: General exam: Appears calm and comfortable, lethargic  Respiratory system: +Rhonchi. Respiratory effort normal. Cardiovascular system: S1 & S2 heard, RRR. No JVD, murmurs, rubs, gallops or clicks. No pedal edema. Gastrointestinal system: Abdomen is nondistended, soft and nontender. No organomegaly or masses felt. Normal bowel sounds heard. Central nervous system: Lethargic after receiving ativan  Extremities: Symmetric  Skin: No rashes, lesions or ulcers   Data Reviewed: I have personally reviewed following labs and imaging studies  CBC: Recent Labs  Lab 03/22/17 0721 03/23/17 0316 03/24/17 0250 03/27/17 0502 03/28/17 0506  WBC 14.9* 14.1* 15.2* 22.2* 16.4*  HGB 8.4* 8.2* 8.4* 8.4* 8.2*  HCT 23.2* 23.0* 24.1* 24.2* 23.8*  MCV 88.2 89.5 90.6 90.0 90.8  PLT 264 308 355 337 322   Basic Metabolic Panel: Recent Labs  Lab 03/23/17 0316 03/23/17 1858 03/24/17 0250 03/27/17 0502 03/28/17 0506  NA 132* 134* 132* 127* 127*  K 3.7 4.3 4.1 3.9 3.9  CL 96* 97* 97* 92* 92*  CO2 26 27 25 23 26   GLUCOSE 111* 115* 115* 126* 101*  BUN 12 12  12 13 13   CREATININE 0.58* 0.61 0.60* 0.66 0.62  CALCIUM 8.7* 9.3 9.1 9.0 9.1   GFR: Estimated Creatinine Clearance: 91.5 mL/min (by C-G formula based on SCr of 0.62 mg/dL). Liver Function Tests: Recent Labs  Lab 03/23/17 1858  AST 39  ALT 40  ALKPHOS 84  BILITOT 0.9  PROT 6.8  ALBUMIN 3.4*   No results for input(s): LIPASE, AMYLASE in the last 168 hours. No results for input(s): AMMONIA in the last 168 hours. Coagulation Profile: Recent Labs  Lab 03/28/17 0506  INR 1.17   Cardiac Enzymes: No results for input(s): CKTOTAL, CKMB, CKMBINDEX, TROPONINI in the last 168 hours. BNP (last 3 results) No results for input(s): PROBNP in the last 8760 hours. HbA1C: No results for input(s): HGBA1C in the last 72 hours. CBG: No results for input(s): GLUCAP in the last 168 hours. Lipid Profile: No results  for input(s): CHOL, HDL, LDLCALC, TRIG, CHOLHDL, LDLDIRECT in the last 72 hours. Thyroid Function Tests: No results for input(s): TSH, T4TOTAL, FREET4, T3FREE, THYROIDAB in the last 72 hours. Anemia Panel: No results for input(s): VITAMINB12, FOLATE, FERRITIN, TIBC, IRON, RETICCTPCT in the last 72 hours. Sepsis Labs: No results for input(s): PROCALCITON, LATICACIDVEN in the last 168 hours.  Recent Results (from the past 240 hour(s))  Culture, blood (Routine X 2) w Reflex to ID Panel     Status: Abnormal   Collection Time: 03/20/17 12:58 PM  Result Value Ref Range Status   Specimen Description   Final    BLOOD LEFT HAND Performed at [facility], [address], [place], [place] [address]    Special Requests   Final    BOTTLES DRAWN AEROBIC ONLY Blood Culture adequate volume Performed at [facility], [address], [place], [place] [address]    Culture  Setup Time   Final    GRAM NEGATIVE RODS AEROBIC BOTTLE ONLY CRITICAL RESULT CALLED TO, READ BACK BY AND VERIFIED WITH: M. Derrick Ravel Pharm.D. 14:20 03/21/17 ([email or web address]) Performed at [facility], [address] [place], [place] [address]    Culture PSEUDOMONAS AERUGINOSA (A)  Final   Report Status 03/23/2017 FINAL  Final   Organism ID, Bacteria PSEUDOMONAS AERUGINOSA  Final      Susceptibility   Pseudomonas aeruginosa - MIC*    CEFTAZIDIME 4 SENSITIVE Sensitive     CIPROFLOXACIN <=0.25 SENSITIVE Sensitive     GENTAMICIN <=1 SENSITIVE Sensitive     IMIPENEM 1 SENSITIVE Sensitive     PIP/TAZO 8 SENSITIVE Sensitive     CEFEPIME 2 SENSITIVE Sensitive     * PSEUDOMONAS AERUGINOSA  Blood Culture ID Panel (Reflexed)     Status: Abnormal   Collection Time: 03/20/17 12:58 PM  Result Value Ref Range Status   Enterococcus species NOT DETECTED NOT DETECTED Final   Listeria monocytogenes NOT DETECTED NOT DETECTED Final   Staphylococcus species NOT DETECTED NOT DETECTED Final   Staphylococcus aureus NOT DETECTED NOT DETECTED Final   Streptococcus species NOT DETECTED NOT DETECTED Final   Streptococcus agalactiae NOT DETECTED NOT DETECTED Final   Streptococcus pneumoniae NOT DETECTED NOT DETECTED Final   Streptococcus pyogenes NOT DETECTED NOT DETECTED Final   Acinetobacter baumannii NOT DETECTED NOT DETECTED Final   Enterobacteriaceae species NOT DETECTED NOT DETECTED Final   Enterobacter cloacae complex NOT DETECTED NOT DETECTED Final   Escherichia coli NOT DETECTED NOT DETECTED Final   Klebsiella oxytoca NOT DETECTED NOT DETECTED Final   Klebsiella pneumoniae NOT DETECTED NOT DETECTED Final   Proteus species NOT DETECTED NOT DETECTED Final   Serratia marcescens NOT DETECTED NOT DETECTED Final   Carbapenem resistance NOT DETECTED NOT DETECTED Final   Haemophilus influenzae NOT DETECTED NOT DETECTED Final   Neisseria meningitidis NOT DETECTED NOT DETECTED Final   Pseudomonas aeruginosa DETECTED (A) NOT DETECTED Final    Comment: CRITICAL RESULT CALLED TO, READ BACK BY AND VERIFIED WITH: M. Derrick Ravel Pharm.D. 14:20 03/21/17 ([email or web address])    Candida albicans NOT DETECTED NOT  DETECTED Final   Candida glabrata NOT DETECTED NOT DETECTED Final   Candida krusei NOT DETECTED NOT DETECTED Final   Candida parapsilosis NOT DETECTED NOT DETECTED Final   Candida tropicalis NOT DETECTED NOT DETECTED Final  Culture, blood (Routine X 2) w Reflex to ID Panel     Status: None   Collection Time: 03/20/17  1:11 PM  Result Value Ref Range  Status   Specimen Description   Final    BLOOD LEFT HAND Performed at [facility], [address] [place], [place] [address]    Special Requests   Final    BOTTLES DRAWN AEROBIC ONLY Blood Culture adequate volume Performed at [facility], [address], [place], [place] [address]    Culture   Final    NO GROWTH 5 DAYS Performed at [facility], [address] [place], [place] [address]    Report Status 03/25/2017 FINAL  Final  Urine Culture     Status: None   Collection Time: 03/23/17  8:30 AM  Result Value Ref Range Status   Specimen Description   Final    URINE, CLEAN CATCH Performed at [facility], [address] [place], [place] [address]    Special Requests   Final    Normal Performed at [facility], [address] [place], [place] [address]    Culture   Final    NO GROWTH Performed at [facility], [address], [place], [place] [address]    Report Status 03/24/2017 FINAL  Final       Radiology Studies: Ct Chest Wo Contrast  Result Date: 03/27/2017 CLINICAL DATA:  Abnormal chest radiograph. Congestion, cough and shortness of breath. EXAM: CT CHEST WITHOUT CONTRAST TECHNIQUE: Multidetector CT imaging of the chest was performed following the standard protocol without IV contrast. COMPARISON:  None. FINDINGS: Cardiovascular: There is moderate cardiac enlargement. Small pericardial effusion. Aortic atherosclerosis identified. Calcifications within the LAD and left circumflex coronary artery identified. Mediastinum/Nodes: Normal  appearance of the thyroid gland. Normal appearance of the esophagus. Right supraclavicular lymph node measures 1.8 cm, image 16/2. There is a large mediastinal mass centered around the right paratracheal region measuring 7.1 x 8.1 x 10.1 cm, image 57/2 and image 54/7. The tumor exhibits significant mass effect upon the anterior and left lateral wall of the trachea. Cannot confirm patency of the SVC due to lack of IV contrast material. Tumor extends across the midline into the left paratracheal region. Enlarged subcarinal node measures 1.5 cm, image 75/2. Left-sided pre-vascular lymph node measures 1.2 cm, image 63/2. Lungs/Pleura: Small bilateral pleural effusions identified. Large segmental areas of airspace consolidation or atelectasis noted within the right middle lobe and right lower lobe. Subsegmental atelectasis within the posterior left upper lobe and left lower lobe identified. Scattered tiny nodules are identified within the right upper lobe. These all measure around 3-4 mm. A few calcified granulomas identified within the right lung. Upper Abdomen: Cyst identified within the medial segment of left lobe of liver measuring 1 cm, image 129/2. No acute abnormality noted within the upper abdomen. Degenerative disc disease identified within the thoracic spine. No aggressive lytic or sclerotic bone lesions. Right lower posterior rib fracture appears subacute to chronic, image 160/2. Musculoskeletal: No chest wall mass or suspicious bone lesions identified. IMPRESSION: 1. Large mediastinal mass centered around the right paratracheal region is identified having mass effect upon the trachea and extending across the midline into the left paratracheal space. Finding is highly worrisome for malignancy. Enlarged left-sided prevascular, subcarinal and right supraclavicular lymph nodes identified. Differential considerations include primary bronchogenic carcinoma versus lymphoproliferative disorder. Further evaluation  with PET-CT and tissue sampling is advised. 2. Small pericardial effusion. 3. Bilateral pleural effusions and large segmental areas of airspace consolidation/atelectasis in the right middle lobe and right lower lobe. Subsegmental atelectasis noted within the lingula and left  lower lobe. 4. Multiple tiny calcified and noncalcified nodules are identified within the right lung. Nonspecific. 5. Aortic atherosclerosis with LAD and left circumflex coronary artery atherosclerotic calcifications. Aortic Atherosclerosis (ICD10-I70.0). Electronically Signed   By: Signa Kell M.D.   On: 03/27/2017 11:16   Ct Chest W Contrast  Result Date: 03/28/2017 CLINICAL DATA:  Staging lung cancer. EXAM: CT CHEST, ABDOMEN, AND PELVIS WITH CONTRAST TECHNIQUE: Multidetector CT imaging of the chest, abdomen and pelvis was performed following the standard protocol during bolus administration of intravenous contrast. CONTRAST:  ISOVUE-300 IOPAMIDOL (ISOVUE-300) INJECTION 61% COMPARISON:  03/27/2017 FINDINGS: CT CHEST FINDINGS Cardiovascular: The heart size is mildly enlarged. Small pericardial effusion. Aortic atherosclerosis. Calcification in the LAD, left circumflex and RCA coronary artery noted. Mediastinum/Nodes: Again identified is a large right mediastinal mass which measures 8.2 x 8.1 by 11.0 cm. The mass completely occludes the SVC. Extensive right chest wall collaterals are identified. The mass also encases the right brachiocephalic vein and has mass effect upon the aortic arch. Mass effect with leftward deviation of the trachea is also identified. The esophagus is unremarkable. Enlarged subcarinal lymph node measures 1.7 cm, image 28 of series 2. Left-sided pre-vascular lymph node measures 11 mm. Right supraclavicular lymph node measures 1.2 cm short axis, image 5/2. There is a left supraclavicular lymph node which is enlarged measuring 1.5 cm, image 9 of series 2. No hilar adenopathy identified. Lungs/Pleura: Small to  moderate bilateral pleural effusions identified. There is partial atelectasis of the right middle lobe and right lower lobe. Passive atelectasis overlying the left pleural effusion and within the lingula noted. Musculoskeletal: No aggressive lytic or sclerotic bone lesions identified. CT ABDOMEN PELVIS FINDINGS Hepatobiliary: Small low density structure within the left lobe of liver measures 1.2 cm and likely represents a small cyst. No suspicious liver abnormalities identified. Gallbladder appears normal. No biliary dilatation. Pancreas: Unremarkable. No pancreatic ductal dilatation or surrounding inflammatory changes. Spleen: Normal in size without focal abnormality. Adrenals/Urinary Tract: Normal appearance of the adrenal glands. Small right kidney cysts. Stone within right extrarenal pelvis measures 1.8 by 1.3 cm. No hydronephrosis. Urinary bladder appears unremarkable. Stomach/Bowel: Stomach is within normal limits. Appendix appears normal. No evidence of bowel wall thickening, distention, or inflammatory changes. Vascular/Lymphatic: Aortic atherosclerosis. No aneurysm. No adenopathy within the abdomen or pelvis. No inguinal adenopathy. Reproductive: Prostate gland enlargement noted. Other: No ascites identified within the abdomen or pelvis. Musculoskeletal: Multi level degenerative disc disease identified within the lumbar spine. Previous right hip arthroplasty. Marked degenerative changes involve the left hip. IMPRESSION: 1. Large right-sided mediastinal mass is again identified. 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 Encasement of the right brachiocephalic vein is also noted. Findings are compared scratch set assuming non-small cell histology imaging findings are compatible with a T4N3M0 lesion or stage IIIb disease. Further evaluation with PET-CT and tissue sampling is advised. 2. Enlarged bilateral supraclavicular, subcarinal and  left-sided mediastinal lymph nodes. 3. Small to moderate bilateral pleural effusions with atelectasis involving the right middle lobe, lingula and both lower lobes. 4. No evidence for osseous metastatic disease and no solid organ or nodal metastasis to the abdomen or pelvis. 5. Aortic atherosclerosis and 3 vessel coronary artery atherosclerotic calcifications. Aortic Atherosclerosis (ICD10-I70.0). 6. Small pericardial effusion. 7. Right renal pelvis calcification without hydronephrosis. Electronically Signed   By: Signa Kell M.D.   On: 03/28/2017 12:08   Ct Abdomen Pelvis W Contrast  Result Date: 03/28/2017 CLINICAL DATA:  Staging lung cancer. EXAM: CT CHEST, ABDOMEN, AND PELVIS WITH CONTRAST TECHNIQUE: Multidetector CT imaging of the chest, abdomen and pelvis was performed following the standard protocol during bolus administration of intravenous contrast. CONTRAST:  ISOVUE-300 IOPAMIDOL (ISOVUE-300) INJECTION 61% COMPARISON:  03/27/2017 FINDINGS: CT CHEST FINDINGS Cardiovascular: The heart size is mildly enlarged. Small pericardial effusion. Aortic atherosclerosis. Calcification in the LAD, left circumflex and RCA coronary artery noted. Mediastinum/Nodes: Again identified is a large right mediastinal mass which measures 8.2 x 8.1 by 11.0 cm. The mass completely occludes the SVC. Extensive right chest wall collaterals are identified. The mass also encases the right brachiocephalic vein and has mass effect upon the aortic arch. Mass effect with leftward deviation of the trachea is also identified. The esophagus is unremarkable. Enlarged subcarinal lymph node measures 1.7 cm, image 28 of series 2. Left-sided pre-vascular lymph node measures 11 mm. Right supraclavicular lymph node measures 1.2 cm short axis, image 5/2. There is a left supraclavicular lymph node which is enlarged measuring 1.5 cm, image 9 of series 2. No hilar adenopathy identified. Lungs/Pleura: Small to moderate bilateral pleural  effusions identified. There is partial atelectasis of the right middle lobe and right lower lobe. Passive atelectasis overlying the left pleural effusion and within the lingula noted. Musculoskeletal: No aggressive lytic or sclerotic bone lesions identified. CT ABDOMEN PELVIS FINDINGS Hepatobiliary: Small low density structure within the left lobe of liver measures 1.2 cm and likely represents a small cyst. No suspicious liver abnormalities identified. Gallbladder appears normal. No biliary dilatation. Pancreas: Unremarkable. No pancreatic ductal dilatation or surrounding inflammatory changes. Spleen: Normal in size without focal abnormality. Adrenals/Urinary Tract: Normal appearance of the adrenal glands. Small right kidney cysts. Stone within right extrarenal pelvis measures 1.8 by 1.3 cm. No hydronephrosis. Urinary bladder appears unremarkable. Stomach/Bowel: Stomach is within normal limits. Appendix appears normal. No evidence of bowel wall thickening, distention, or inflammatory changes. Vascular/Lymphatic: Aortic atherosclerosis. No aneurysm. No adenopathy within the abdomen or pelvis. No inguinal adenopathy. Reproductive: Prostate gland enlargement noted. Other: No ascites identified within the abdomen or pelvis. Musculoskeletal: Multi level degenerative disc disease identified within the lumbar spine. Previous right hip arthroplasty. Marked degenerative changes involve the left hip. IMPRESSION: 1. Large right-sided mediastinal mass is again identified. 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 Encasement of the right brachiocephalic vein is also noted. Findings are compared scratch set assuming non-small cell histology imaging findings are compatible with a T4N3M0 lesion or stage IIIb disease. Further evaluation with PET-CT and tissue sampling is advised. 2. Enlarged bilateral supraclavicular, subcarinal and left-sided mediastinal lymph nodes.  3. Small to moderate bilateral pleural effusions with atelectasis involving the right middle lobe, lingula and both lower lobes. 4. No evidence for osseous metastatic disease and no solid organ or nodal metastasis to the abdomen or pelvis. 5. Aortic atherosclerosis and 3 vessel coronary artery atherosclerotic calcifications. Aortic Atherosclerosis (ICD10-I70.0). 6. Small pericardial effusion. 7. Right renal pelvis calcification without hydronephrosis. Electronically Signed   By: Signa Kell M.D.   On: 03/28/2017 12:08      Scheduled Meds: . amLODipine  10 mg Oral Daily  . benazepril  20 mg Oral Daily  . chlorhexidine  15 mL Mouth Rinse BID  . docusate sodium  100 mg Oral BID  . famotidine  20 mg Oral BID  . ferrous sulfate  325 mg Oral BID WC  . folic acid  1 mg Oral Daily  . guaiFENesin  1,200  mg Oral BID  . iopamidol      . iopamidol      . mouth rinse  15 mL Mouth Rinse q12n4p  . nicotine  21 mg Transdermal Daily  . pseudoephedrine  30 mg Oral Daily  . risperiDONE  1 mg Oral QHS  . sodium chloride      . thiamine  100 mg Oral Daily   Continuous Infusions: . ceFEPime (MAXIPIME) IV Stopped (03/28/17 1610)     LOS: 13 days    Time spent: 30 minutes   Noralee Stain, DO [facility] [email or web address] Password [facility] 03/28/2017, 12:32 PM

## 2017-03-28 NOTE — Plan of Care (Signed)
  Progressing Nutrition: Adequate nutrition will be maintained 03/28/2017 0104 - Progressing by Cristela FeltSteffens, Isami Mehra P, RN Elimination: Will not experience complications related to urinary retention 03/28/2017 0104 - Progressing by Cristela FeltSteffens, Jlynn Langille P, RN Pain Managment: General experience of comfort will improve 03/28/2017 0104 - Progressing by Cristela FeltSteffens, Zerina Hallinan P, RN Safety: Ability to remain free from injury will improve 03/28/2017 0104 - Progressing by Cristela FeltSteffens, Kesi Perrow P, RN

## 2017-03-28 NOTE — Progress Notes (Signed)
Patient's POA, Mr. Leticia ClasRivera stated he talked to patient about the biopsy, for right now the patient does not want biopsy done.

## 2017-03-29 DIAGNOSIS — R7881 Bacteremia: Secondary | ICD-10-CM

## 2017-03-29 DIAGNOSIS — I5032 Chronic diastolic (congestive) heart failure: Secondary | ICD-10-CM

## 2017-03-29 DIAGNOSIS — G9341 Metabolic encephalopathy: Secondary | ICD-10-CM

## 2017-03-29 DIAGNOSIS — R222 Localized swelling, mass and lump, trunk: Secondary | ICD-10-CM

## 2017-03-29 DIAGNOSIS — J9602 Acute respiratory failure with hypercapnia: Secondary | ICD-10-CM

## 2017-03-29 DIAGNOSIS — R0602 Shortness of breath: Secondary | ICD-10-CM

## 2017-03-29 LAB — CBC
HEMATOCRIT: 23.8 % — AB (ref 39.0–52.0)
HEMOGLOBIN: 8.3 g/dL — AB (ref 13.0–17.0)
MCH: 32.3 pg (ref 26.0–34.0)
MCHC: 34.9 g/dL (ref 30.0–36.0)
MCV: 92.6 fL (ref 78.0–100.0)
Platelets: 328 10*3/uL (ref 150–400)
RBC: 2.57 MIL/uL — ABNORMAL LOW (ref 4.22–5.81)
RDW: 13.8 % (ref 11.5–15.5)
WBC: 14.9 10*3/uL — ABNORMAL HIGH (ref 4.0–10.5)

## 2017-03-29 LAB — BASIC METABOLIC PANEL
ANION GAP: 8 (ref 5–15)
BUN: 13 mg/dL (ref 6–20)
CHLORIDE: 92 mmol/L — AB (ref 101–111)
CO2: 29 mmol/L (ref 22–32)
Calcium: 9.1 mg/dL (ref 8.9–10.3)
Creatinine, Ser: 0.66 mg/dL (ref 0.61–1.24)
GFR calc Af Amer: >60 mL/min (ref >60)
GFR calc non Af Amer: >60 mL/min (ref >60)
GLUCOSE: 94 mg/dL (ref 65–99)
POTASSIUM: 4 mmol/L (ref 3.5–5.1)
Sodium: 129 mmol/L — ABNORMAL LOW (ref 135–145)

## 2017-03-29 MED ORDER — CIPROFLOXACIN HCL 750 MG PO TABS: 750.0000 mg | ORAL_TABLET | Freq: Two times a day (BID) | ORAL | 0 refills | Status: AC

## 2017-03-29 MED ORDER — RIVAROXABAN 10 MG PO TABS
10.0000 mg | ORAL_TABLET | Freq: Every day | ORAL | Status: DC
  Filled 2017-03-29: qty 1

## 2017-03-29 MED ORDER — CIPROFLOXACIN HCL 500 MG PO TABS
750.0000 mg | ORAL_TABLET | Freq: Two times a day (BID) | ORAL | Status: DC
  Filled 2017-03-29: qty 1

## 2017-03-29 MED ORDER — FOLIC ACID 1 MG PO TABS: 1.0000 mg | ORAL_TABLET | Freq: Every day | ORAL | Status: AC

## 2017-03-29 MED ORDER — RIVAROXABAN 10 MG PO TABS: 10.0000 mg | ORAL_TABLET | Freq: Every day | ORAL | Status: AC

## 2017-03-29 MED ORDER — SALINE SPRAY 0.65 % NA SOLN: 2.0000 | NASAL | 0 refills | Status: AC | PRN

## 2017-03-29 MED ORDER — SODIUM CHLORIDE 1 G PO TABS: 1.0000 g | ORAL_TABLET | Freq: Two times a day (BID) | ORAL | Status: DC

## 2017-03-29 MED ORDER — FERROUS SULFATE 325 (65 FE) MG PO TABS: 325.0000 mg | ORAL_TABLET | Freq: Two times a day (BID) | ORAL | 3 refills | Status: AC

## 2017-03-29 MED ORDER — SODIUM CHLORIDE 1 G PO TABS: 1.0000 g | ORAL_TABLET | Freq: Two times a day (BID) | ORAL | 0 refills | Status: AC

## 2017-03-29 MED ORDER — GUAIFENESIN ER 600 MG PO TB12: 1200.0000 mg | ORAL_TABLET | Freq: Two times a day (BID) | ORAL | Status: AC

## 2017-03-29 MED ORDER — HYDROCODONE-ACETAMINOPHEN 5-325 MG PO TABS: 1.0000 | ORAL_TABLET | Freq: Four times a day (QID) | ORAL | 0 refills | Status: AC | PRN

## 2017-03-29 MED ORDER — POLYETHYLENE GLYCOL 3350 17 G PO PACK: 17.0000 g | PACK | Freq: Every day | ORAL | 0 refills | Status: AC | PRN

## 2017-03-29 MED ORDER — RISPERIDONE 1 MG PO TABS: 1.0000 mg | ORAL_TABLET | Freq: Every day | ORAL | Status: AC

## 2017-03-29 MED ORDER — FAMOTIDINE 20 MG PO TABS: 20.0000 mg | ORAL_TABLET | Freq: Two times a day (BID) | ORAL | 0 refills | Status: AC

## 2017-03-29 MED ORDER — ALBUTEROL SULFATE (2.5 MG/3ML) 0.083% IN NEBU: 2.5000 mg | INHALATION_SOLUTION | RESPIRATORY_TRACT | 12 refills | Status: AC | PRN

## 2017-03-29 MED ORDER — BISACODYL 10 MG RE SUPP: 10.0000 mg | Freq: Every day | RECTAL | 0 refills | Status: AC | PRN

## 2017-03-29 MED ORDER — NICOTINE 21 MG/24HR TD PT24: 21.0000 mg | MEDICATED_PATCH | Freq: Every day | TRANSDERMAL | 0 refills | Status: AC

## 2017-03-29 MED ORDER — THIAMINE HCL 100 MG PO TABS: 100.0000 mg | ORAL_TABLET | Freq: Every day | ORAL | Status: AC

## 2017-03-29 MED ORDER — DOCUSATE SODIUM 100 MG PO CAPS: 100.0000 mg | ORAL_CAPSULE | Freq: Two times a day (BID) | ORAL | 0 refills | Status: AC

## 2017-03-29 NOTE — Clinical Social Work Placement (Signed)
Pt discharged with [facility]- room #130- report [phone].  Facility obtained [employer] insurance authorization for SNF admission.  DC information sent via the HUB and confirmed with representative facility is prepared for pt's admission today.  Arranged [facility] transportation 15:30.  Spoke with pt's POA Gladstone PihElias via phone [phone]. He is coming to hospital to be here for pt's DC, estimates he will be here 15:00-15:15.   See below for placement details  CLINICAL SOCIAL WORK PLACEMENT  NOTE  Date:  03/29/2017  Patient Details  Name: Fritz PickerelKenneth Smalls MRN: [id number] Date of Birth: 08-Apr-1945  Clinical Social Work is seeking post-discharge placement for this patient at the Skilled  Nursing Facility level of care (*CSW will initial, date and re-position this form in  chart as items are completed):  Yes   Patient/family provided with [facility] Clinical Social Work Department's list of facilities offering this level of care within the geographic area requested by the patient (or if unable, by the patient's family).  Yes   Patient/family informed of their freedom to choose among providers that offer the needed level of care, that participate in Medicare, Medicaid or managed care program needed by the patient, have an available bed and are willing to accept the patient.  Yes   Patient/family informed of 's ownership interest in [facility] and [facility], as well as of the fact that they are under no obligation to receive care at these facilities.  PASRR submitted to EDS on 03/24/17     PASRR number received on 03/24/17     Existing PASRR number confirmed on       FL2 transmitted to all facilities in geographic area requested by pt/family on 03/24/17     FL2 transmitted to all facilities within larger geographic area on       Patient informed that his/her managed care company has contracts with or will negotiate with  certain facilities, including the following:  [facility]     Yes   Patient/family informed of bed offers received.  [facility]     Physician [facility] and [facility]    [facility]  Patient to be transferred to facility by PT[facility]     Patient family notified on 03/29/17 of transfer.  Name of family member notified:  POA Rise PatienceElias Rivera     PHYSICIAN Please prepare prescriptions     Additional Comment:    _______________________________________________ Nelwyn SalisburyMeghan R Bastian Andreoli, LCSW 03/29/2017, 2:32 PM [phone]

## 2017-03-29 NOTE — Progress Notes (Signed)
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  Still confused and confabulating, but more appropriate with mobility.  Agree to SNF level rehab needs at d/c.  Sheran LawlessCyndi Stelios Kirby, [place] [phone] 03/29/2017    Follow Up Recommendations  SNF     Equipment Recommendations  None recommended by PT    Recommendations for Other Services       Precautions / Restrictions Precautions Precautions: Fall Precaution Comments: monitor O2 sats Restrictions RLE Weight Bearing: Weight bearing as tolerated Other Position/Activity Restrictions: no hip precautions    Mobility  Bed Mobility Overal bed mobility: Needs Assistance Bed Mobility: Supine to Sit       Sit to supine: Mod assist   General bed mobility comments: assist for scooting out to EOB and guiding legs off the bed  Transfers Overall transfer level: Needs assistance Equipment used: Rolling walker (2 wheeled) Transfers: Sit to/from [employer]Stand;Stand Pivot Transfers Sit to Stand: Mod assist;From elevated surface Stand pivot transfers: Mod assist       General transfer comment: RW and assist to pivot to  chair due to bed soiled with coffee; slow steps with audible crepitus in hip and imbalance with high fall risk; continued extreme flexed posture with support to prevent LOB  Ambulation/Gait                 Stairs            Wheelchair Mobility    Modified Rankin (Stroke Patients Only)       Balance Overall balance assessment: Needs assistance   Sitting balance-Leahy Scale: Fair     Standing balance support: Bilateral upper extremity supported Standing balance-Leahy Scale: Poor Standing balance comment: UE support                              Cognition Arousal/Alertness: Awake/alert Behavior During Therapy: Anxious Overall Cognitive Status: Impaired/Different from baseline Area of Impairment: Attention;Following commands;Safety/judgement                   Current Attention Level: Sustained Memory: Decreased short-term memory Following Commands: Follows one step commands with increased time;Follows one step commands inconsistently Safety/Judgement: Decreased awareness of safety;Decreased awareness of deficits   Problem Solving: Decreased initiation;Slow processing General Comments: perseverating on needing help to make sure he doesn't get in trouble for breaking the law      Exercises      General Comments        Pertinent Vitals/Pain Pain Assessment: No/denies pain    Home Living                      Prior Function            PT Goals (current goals can now be found in the care plan section) Progress towards PT goals: Progressing toward goals    Frequency    7X/week      PT Plan Current plan remains appropriate    Co-evaluation              AM-PAC PT "6 Clicks" Daily Activity  Outcome Measure  Difficulty turning over in bed (including adjusting bedclothes, sheets and blankets)?: Unable Difficulty moving  from lying on back to sitting on the side of the bed? : Unable Difficulty sitting down on and standing up from a chair with arms (e.g., wheelchair, bedside commode, etc,.)?: Unable Help needed moving to and from a bed to chair (including a wheelchair)?: A Lot Help needed walking in hospital room?: A Lot Help needed climbing 3-5 steps with a railing? : Total 6 Click Score: 8    End of Session Equipment Utilized During Treatment: Oxygen Activity Tolerance: Patient tolerated treatment well Patient left: with call bell/phone within reach;with chair alarm set;with family/visitor present;in chair   PT Visit Diagnosis: Muscle weakness (generalized) (M62.81);Other abnormalities  of gait and mobility (R26.89);Pain Pain - Right/Left: Right     Time: 4098-1191 PT Time Calculation (min) (ACUTE ONLY): 34 min  Charges:  $Therapeutic Activity: 23-37 mins                    G CodesSheran Lawless, [place] [phone] 03/29/2017    Elray Mcgregor 03/29/2017, 5:11 PM

## 2017-03-29 NOTE — Discharge Summary (Signed)
Physician Discharge Summary  [name] ZOX:[id number] DOB: 06-18-1945 DOA: 03/15/2017  PCP: Jeoffrey Massed, MD  Admit date: 03/15/2017 Discharge date: 03/29/2017  Admitted From: Home Disposition:  SNF  Recommendations for Outpatient Follow-up:  1. Follow up with PCP in 1-2 weeks 2. Follow up with Orthopedic Surgery within 2 weeks 3. Follow up with Pulmonary as an outpatient 4. Follow up for repeat CXR in 3-6 Weeks 5. Consider obtaining CT Head w/o Contrast and Neurology consultation as an outpatient if patient's delirium and confusion does not improve after infection has been treated  6. Please obtain CMP/CBC, Mag, Phos in one week 7. Please follow up on the following pending results:  Home Health: No Equipment/Devices: None  Discharge Condition: Fair  CODE STATUS: FULL CODE Diet recommendation: As before   Brief/Interim Summary: Randy Burgess is a 72 y.o.malewho underwent right hip replacement on 03/15/17, postop developed hyponatremia, delirium, and acute hypoxic respiratory failure, was admitted to ICU on BiPAP, found to have HCAP, blood cultures subsequently grew Pseudomonas; Vancomycin discontinued and currently on IV cefepime 2gm q 8 hr since 03/21/2017, transferred to hospitalist service on 03/23/17. 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 Patient's mental status waxing and waning and remains somewhat delirious but has improved and is stable to D/C to SNF and follow up with PCP, Orthopedics, Pulmonary as an outpatient.   Discharge Diagnoses:  Principal Problem:   Acute metabolic encephalopathy Active Problems:   OA (osteoarthritis) of hip   H/O total hip arthroplasty   Acute respiratory failure with hypoxia and hypercapnia (HCC)   Bacteremia due to Pseudomonas   Hyponatremia   Chronic diastolic CHF (congestive heart failure) (HCC)   HTN (hypertension)   Mediastinal mass  Acute Encephalopathy -Developed encephalopathy post-op  ORIF on 2/13 and required Precedex for Psychosis/Delirium. Encephalopathy initially improved with treatment of hyponatremia in ICU, thought perhaps alcohol withdrawal as well and was started on CIWA protocol, although friend states that patient has not had alcohol in over a month -Mental Status still Waxing and Waning and patient remains Delirious but suspect Infectious Etiology for Delirium/Confusion  -Continues to be on risperidone qhs  -Received IV ativan yesterday morning due to anxiety and patient very lethargic Morning. Will stop benzo and watch improvement -If Declines even further recommending obtaining CT Scan w/o Contrast and possible MRI but can be done as an outpatient as patient is stable currently and POA does not want further workup at this time as he wants patient to be transferred to rehab   Acute hypercarbic, hypoxemic respiratory failure, improved -Now off BiPAP -Likely from HCAP -Continue to wean [place] O2 as able   -C/w Guaifenesin and prn Albuterol Treatments   Pseudomonas HCAP with Bacteremia -Continue IV cefepime for total 14 days but will de-escalate to Ciprofloxacin 750 mg po BID x 4 more days for Completion  -Repeat blood culture negative -WBC improved and went from 22.2 -> 16.4 -> 14.9 -Repeat CXR as an outpatient   Hyponatremia  -Serum osmol 241 -Urine osmol 316 -Urine Na 13  -Initially thought to be secondary to SIADH? However urine Na only 13.  -Sodium now Improving -Will add Salt Tablets -Follow up BMP's as an outpatient   Chronic diastolic CHF -EF 81-19%, grade 1 diastolic dysfunction -Currently Euvolemic  -Stable   HTN -Continue Amlodipine 10 mg daily andBenazepril 20 mg daily -Stable; Follow up with PCP for further medication titration and BP Monitoring   S/p ORIF of right hip fx -PT  recommending SNF  -Has been on Xarelto for DVT prophylaxis and ok to resume as patient will not be getting biopsy   Mediastinal Mass -CT chest revealed  large mediastinal mass centered around right paratracheal region, mass effect upon trachea and extending into left paratracheal space, enlarged lymph nodes; There was complete occulsion of the SVC with Right Chest Collaterales  -Consulted pulmonary for recommendations for tissue sampling  -IR consulted for supraclavicular node biopsy, although POA/friend wants to discuss with patient later today prior to undergoing biopsy. Per POA they DO NOT WANT biopsy and do not want mass worked up further   Tobacco Abuse -Nicotine 21 mg TD patch  -Smoking Cessation counseling given  Normocytic Anemia -Hb/Hct Stable -C/w Iron Supplementation  -Monitor for S/Sx of Bleeding -Repeat CBC as an outpatient   Discharge Instructions  Discharge Instructions    Call MD for:  difficulty breathing, headache or visual disturbances   Complete by:  As directed    Call MD for:  extreme fatigue   Complete by:  As directed    Call MD for:  hives   Complete by:  As directed    Call MD for:  persistant dizziness or light-headedness   Complete by:  As directed    Call MD for:  persistant nausea and vomiting   Complete by:  As directed    Call MD for:  redness, tenderness, or signs of infection (pain, swelling, redness, odor or green/yellow discharge around incision site)   Complete by:  As directed    Call MD for:  severe uncontrolled pain   Complete by:  As directed    Call MD for:  temperature >100.4   Complete by:  As directed    Diet - low sodium heart healthy   Complete by:  As directed    Discharge instructions   Complete by:  As directed    Follow up with PCP, Orthopedics, and Pulmonary as an outpatient. Take all medications as prescribed. If symptoms change or worsen please return to the ED for evaluation.   Increase activity slowly   Complete by:  As directed      Allergies as of 03/29/2017      Reactions   Penicillins    Childhood allergy Has patient had a PCN reaction causing immediate rash,  facial/tongue/throat swelling, SOB or lightheadedness with hypotension: Unknown Has patient had a PCN reaction causing severe rash involving mucus membranes or skin necrosis: Unknown Has patient had a PCN reaction that required hospitalization: Unknown Has patient had a PCN reaction occurring within the last 10 years: No If all of the above answers are "NO", then may proceed with Cephalosporin use.   Sulfa Antibiotics    Unknown reactions       Medication List    STOP taking these medications   hydrochlorothiazide 25 MG tablet Commonly known as:  HYDRODIURIL   Melatonin 5 MG Tabs   oxyCODONE 5 MG immediate release tablet Commonly known as:  Oxy IR/ROXICODONE   pseudoephedrine 30 MG tablet Commonly known as:  SUDAFED   ranitidine 150 MG tablet Commonly known as:  ZANTAC     TAKE these medications   albuterol (2.5 MG/3ML) 0.083% nebulizer solution Commonly known as:  PROVENTIL Take 3 mLs (2.5 mg total) by nebulization every 4 (four) hours as needed for wheezing or shortness of breath.   amLODipine-benazepril 10-20 MG capsule Commonly known as:  LOTREL Take 1 capsule by mouth daily.   ANTI GAS PO Take 1-2 capsules  by mouth daily as needed (gas).   bisacodyl 10 MG suppository Commonly known as:  DULCOLAX Place 1 suppository (10 mg total) rectally daily as needed for moderate constipation.   ciprofloxacin 750 MG tablet Commonly known as:  CIPRO Take 1 tablet (750 mg total) by mouth 2 (two) times daily. Start taking on:  04-28-2017   docusate sodium 100 MG capsule Commonly known as:  COLACE Take 1 capsule (100 mg total) by mouth 2 (two) times daily.   famotidine 20 MG tablet Commonly known as:  PEPCID Take 1 tablet (20 mg total) by mouth 2 (two) times daily.   ferrous sulfate 325 (65 FE) MG tablet Take 1 tablet (325 mg total) by mouth 2 (two) times daily with a meal.   folic acid 1 MG tablet Commonly known as:  FOLVITE Take 1 tablet (1 mg total) by mouth  daily. Start taking on:  April 28, 2017   guaiFENesin 600 MG 12 hr tablet Commonly known as:  MUCINEX Take 2 tablets (1,200 mg total) by mouth 2 (two) times daily.   HYDROcodone-acetaminophen 5-325 MG tablet Commonly known as:  NORCO/VICODIN Take 1 tablet by mouth every 6 (six) hours as needed for severe pain.   LUBRICATING EYE DROPS OP Apply 1 drop to eye as needed (dry eyes).   nicotine 21 mg/24hr patch Commonly known as:  NICODERM CQ - dosed in mg/24 hours Place 1 patch (21 mg total) onto the skin daily. Start taking on:  2017-04-28   polyethylene glycol packet Commonly known as:  MIRALAX / GLYCOLAX Take 17 g by mouth daily as needed for mild constipation.   risperiDONE 1 MG tablet Commonly known as:  RISPERDAL Take 1 tablet (1 mg total) by mouth at bedtime.   rivaroxaban 10 MG Tabs tablet Commonly known as:  XARELTO Take 1 tablet (10 mg total) by mouth daily.   sodium chloride 0.65 % Soln nasal spray Commonly known as:  OCEAN Place 2 sprays into both nostrils as needed for congestion.   sodium chloride 1 g tablet Take 1 tablet (1 g total) by mouth 2 (two) times daily with a meal.   thiamine 100 MG tablet Take 1 tablet (100 mg total) by mouth daily. Start taking on:  2017-04-28      Contact information for after-discharge care    Destination    [facility] .   Service:  Skilled Nursing Contact information: [address] [place] [address] [phone]             Allergies  Allergen Reactions  . Penicillins     Childhood allergy Has patient had a PCN reaction causing immediate rash, facial/tongue/throat swelling, SOB or lightheadedness with hypotension: Unknown Has patient had a PCN reaction causing severe rash involving mucus membranes or skin necrosis: Unknown Has patient had a PCN reaction that required hospitalization: Unknown Has patient had a PCN reaction occurring within the last 10 years: No If all of the above  answers are "NO", then may proceed with Cephalosporin use.   . Sulfa Antibiotics     Unknown reactions    Consultations:  PCCM  Orthopedic Surgery  Interventional Radiology  Procedures/Studies: X-ray Chest Pa Or Ap  Result Date: 03/15/2017 CLINICAL DATA:  Shortness of breath. EXAM: CHEST 1 VIEW COMPARISON:  None. FINDINGS: The patient is slightly rotated to the left. Normal heart size. Accentuation of the right paratracheal stripe is likely due to rotation. Elevation of the left hemidiaphragm. Bibasilar atelectasis. No focal consolidation, pleural effusion,  or pneumothorax. No acute osseous abnormality. IMPRESSION: 1. Accentuation of the right paratracheal stripe is likely due to patient rotation. Recommend dedicated PA and lateral views to exclude underlying adenopathy/mass. 2. Bibasilar atelectasis. Electronically Signed   By: Obie Dredge M.D.   On: 03/15/2017 14:43   Ct Chest Wo Contrast  Result Date: 03/27/2017 CLINICAL DATA:  Abnormal chest radiograph. Congestion, cough and shortness of breath. EXAM: CT CHEST WITHOUT CONTRAST TECHNIQUE: Multidetector CT imaging of the chest was performed following the standard protocol without IV contrast. COMPARISON:  None. FINDINGS: Cardiovascular: There is moderate cardiac enlargement. Small pericardial effusion. Aortic atherosclerosis identified. Calcifications within the LAD and left circumflex coronary artery identified. Mediastinum/Nodes: Normal appearance of the thyroid gland. Normal appearance of the esophagus. Right supraclavicular lymph node measures 1.8 cm, image 16/2. There is a large mediastinal mass centered around the right paratracheal region measuring 7.1 x 8.1 x 10.1 cm, image 57/2 and image 54/7. The tumor exhibits significant mass effect upon the anterior and left lateral wall of the trachea. Cannot confirm patency of the SVC due to lack of IV contrast material. Tumor extends across the midline into the left paratracheal region.  Enlarged subcarinal node measures 1.5 cm, image 75/2. Left-sided pre-vascular lymph node measures 1.2 cm, image 63/2. Lungs/Pleura: Small bilateral pleural effusions identified. Large segmental areas of airspace consolidation or atelectasis noted within the right middle lobe and right lower lobe. Subsegmental atelectasis within the posterior left upper lobe and left lower lobe identified. Scattered tiny nodules are identified within the right upper lobe. These all measure around 3-4 mm. A few calcified granulomas identified within the right lung. Upper Abdomen: Cyst identified within the medial segment of left lobe of liver measuring 1 cm, image 129/2. No acute abnormality noted within the upper abdomen. Degenerative disc disease identified within the thoracic spine. No aggressive lytic or sclerotic bone lesions. Right lower posterior rib fracture appears subacute to chronic, image 160/2. Musculoskeletal: No chest wall mass or suspicious bone lesions identified. IMPRESSION: 1. Large mediastinal mass centered around the right paratracheal region is identified having mass effect upon the trachea and extending across the midline into the left paratracheal space. Finding is highly worrisome for malignancy. Enlarged left-sided prevascular, subcarinal and right supraclavicular lymph nodes identified. Differential considerations include primary bronchogenic carcinoma versus lymphoproliferative disorder. Further evaluation with PET-CT and tissue sampling is advised. 2. Small pericardial effusion. 3. Bilateral pleural effusions and large segmental areas of airspace consolidation/atelectasis in the right middle lobe and right lower lobe. Subsegmental atelectasis noted within the lingula and left lower lobe. 4. Multiple tiny calcified and noncalcified nodules are identified within the right lung. Nonspecific. 5. Aortic atherosclerosis with LAD and left circumflex coronary artery atherosclerotic calcifications. Aortic  Atherosclerosis (ICD10-I70.0). Electronically Signed   By: Signa Kell M.D.   On: 03/27/2017 11:16   Ct Chest W Contrast  Result Date: 03/28/2017 CLINICAL DATA:  Staging lung cancer. EXAM: CT CHEST, ABDOMEN, AND PELVIS WITH CONTRAST TECHNIQUE: Multidetector CT imaging of the chest, abdomen and pelvis was performed following the standard protocol during bolus administration of intravenous contrast. CONTRAST:  ISOVUE-300 IOPAMIDOL (ISOVUE-300) INJECTION 61% COMPARISON:  03/27/2017 FINDINGS: CT CHEST FINDINGS Cardiovascular: The heart size is mildly enlarged. Small pericardial effusion. Aortic atherosclerosis. Calcification in the LAD, left circumflex and RCA coronary artery noted. Mediastinum/Nodes: Again identified is a large right mediastinal mass which measures 8.2 x 8.1 by 11.0 cm. The mass completely occludes the SVC. Extensive right chest wall collaterals are identified. The mass also  encases the right brachiocephalic vein and has mass effect upon the aortic arch. Mass effect with leftward deviation of the trachea is also identified. The esophagus is unremarkable. Enlarged subcarinal lymph node measures 1.7 cm, image 28 of series 2. Left-sided pre-vascular lymph node measures 11 mm. Right supraclavicular lymph node measures 1.2 cm short axis, image 5/2. There is a left supraclavicular lymph node which is enlarged measuring 1.5 cm, image 9 of series 2. No hilar adenopathy identified. Lungs/Pleura: Small to moderate bilateral pleural effusions identified. There is partial atelectasis of the right middle lobe and right lower lobe. Passive atelectasis overlying the left pleural effusion and within the lingula noted. Musculoskeletal: No aggressive lytic or sclerotic bone lesions identified. CT ABDOMEN PELVIS FINDINGS Hepatobiliary: Small low density structure within the left lobe of liver measures 1.2 cm and likely represents a small cyst. No suspicious liver abnormalities identified. Gallbladder appears  normal. No biliary dilatation. Pancreas: Unremarkable. No pancreatic ductal dilatation or surrounding inflammatory changes. Spleen: Normal in size without focal abnormality. Adrenals/Urinary Tract: Normal appearance of the adrenal glands. Small right kidney cysts. Stone within right extrarenal pelvis measures 1.8 by 1.3 cm. No hydronephrosis. Urinary bladder appears unremarkable. Stomach/Bowel: Stomach is within normal limits. Appendix appears normal. No evidence of bowel wall thickening, distention, or inflammatory changes. Vascular/Lymphatic: Aortic atherosclerosis. No aneurysm. No adenopathy within the abdomen or pelvis. No inguinal adenopathy. Reproductive: Prostate gland enlargement noted. Other: No ascites identified within the abdomen or pelvis. Musculoskeletal: Multi level degenerative disc disease identified within the lumbar spine. Previous right hip arthroplasty. Marked degenerative changes involve the left hip. IMPRESSION: 1. Large right-sided mediastinal mass is again identified. 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 Encasement of the right brachiocephalic vein is also noted. Findings are compared scratch set assuming non-small cell histology imaging findings are compatible with a T4N3M0 lesion or stage IIIb disease. Further evaluation with PET-CT and tissue sampling is advised. 2. Enlarged bilateral supraclavicular, subcarinal and left-sided mediastinal lymph nodes. 3. Small to moderate bilateral pleural effusions with atelectasis involving the right middle lobe, lingula and both lower lobes. 4. No evidence for osseous metastatic disease and no solid organ or nodal metastasis to the abdomen or pelvis. 5. Aortic atherosclerosis and 3 vessel coronary artery atherosclerotic calcifications. Aortic Atherosclerosis (ICD10-I70.0). 6. Small pericardial effusion. 7. Right renal pelvis calcification without hydronephrosis. Electronically Signed    By: Signa Kell M.D.   On: 03/28/2017 12:08   Ct Abdomen Pelvis W Contrast  Result Date: 03/28/2017 CLINICAL DATA:  Staging lung cancer. EXAM: CT CHEST, ABDOMEN, AND PELVIS WITH CONTRAST TECHNIQUE: Multidetector CT imaging of the chest, abdomen and pelvis was performed following the standard protocol during bolus administration of intravenous contrast. CONTRAST:  ISOVUE-300 IOPAMIDOL (ISOVUE-300) INJECTION 61% COMPARISON:  03/27/2017 FINDINGS: CT CHEST FINDINGS Cardiovascular: The heart size is mildly enlarged. Small pericardial effusion. Aortic atherosclerosis. Calcification in the LAD, left circumflex and RCA coronary artery noted. Mediastinum/Nodes: Again identified is a large right mediastinal mass which measures 8.2 x 8.1 by 11.0 cm. The mass completely occludes the SVC. Extensive right chest wall collaterals are identified. The mass also encases the right brachiocephalic vein and has mass effect upon the aortic arch. Mass effect with leftward deviation of the trachea is also identified. The esophagus is unremarkable. Enlarged subcarinal lymph node measures 1.7 cm, image 28 of series 2. Left-sided pre-vascular lymph node measures 11 mm. Right supraclavicular lymph node measures 1.2 cm short axis, image 5/2.  There is a left supraclavicular lymph node which is enlarged measuring 1.5 cm, image 9 of series 2. No hilar adenopathy identified. Lungs/Pleura: Small to moderate bilateral pleural effusions identified. There is partial atelectasis of the right middle lobe and right lower lobe. Passive atelectasis overlying the left pleural effusion and within the lingula noted. Musculoskeletal: No aggressive lytic or sclerotic bone lesions identified. CT ABDOMEN PELVIS FINDINGS Hepatobiliary: Small low density structure within the left lobe of liver measures 1.2 cm and likely represents a small cyst. No suspicious liver abnormalities identified. Gallbladder appears normal. No biliary dilatation. Pancreas:  Unremarkable. No pancreatic ductal dilatation or surrounding inflammatory changes. Spleen: Normal in size without focal abnormality. Adrenals/Urinary Tract: Normal appearance of the adrenal glands. Small right kidney cysts. Stone within right extrarenal pelvis measures 1.8 by 1.3 cm. No hydronephrosis. Urinary bladder appears unremarkable. Stomach/Bowel: Stomach is within normal limits. Appendix appears normal. No evidence of bowel wall thickening, distention, or inflammatory changes. Vascular/Lymphatic: Aortic atherosclerosis. No aneurysm. No adenopathy within the abdomen or pelvis. No inguinal adenopathy. Reproductive: Prostate gland enlargement noted. Other: No ascites identified within the abdomen or pelvis. Musculoskeletal: Multi level degenerative disc disease identified within the lumbar spine. Previous right hip arthroplasty. Marked degenerative changes involve the left hip. IMPRESSION: 1. Large right-sided mediastinal mass is again identified. 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 Encasement of the right brachiocephalic vein is also noted. Findings are compared scratch set assuming non-small cell histology imaging findings are compatible with a T4N3M0 lesion or stage IIIb disease. Further evaluation with PET-CT and tissue sampling is advised. 2. Enlarged bilateral supraclavicular, subcarinal and left-sided mediastinal lymph nodes. 3. Small to moderate bilateral pleural effusions with atelectasis involving the right middle lobe, lingula and both lower lobes. 4. No evidence for osseous metastatic disease and no solid organ or nodal metastasis to the abdomen or pelvis. 5. Aortic atherosclerosis and 3 vessel coronary artery atherosclerotic calcifications. Aortic Atherosclerosis (ICD10-I70.0). 6. Small pericardial effusion. 7. Right renal pelvis calcification without hydronephrosis. Electronically Signed   By: Signa Kell M.D.   On:  03/28/2017 12:08   Dg Pelvis Portable  Result Date: 03/15/2017 CLINICAL DATA:  Right hip replacement. EXAM: PORTABLE PELVIS 1-2 VIEWS COMPARISON:  None. FINDINGS: AP view of the pelvis demonstrates right hip arthroplasty, without acute hardware complication. Severe left hip osteoarthritis including joint space narrowing and subchondral sclerosis. IMPRESSION: Expected appearance after right hip arthroplasty. Electronically Signed   By: Jeronimo Greaves M.D.   On: 03/15/2017 14:43   Dg Chest Port 1 View  Result Date: 03/23/2017 CLINICAL DATA:  Respiratory failure. EXAM: PORTABLE CHEST 1 VIEW COMPARISON:  03/21/2016.  03/20/2017.  03/18/2017. FINDINGS: Persistent mediastinal fullness. Again adenopathy could present this fashion. Again contrast-enhanced CT of the chest is recommended for further evaluation. Heart size stable. Persistent bibasilar atelectasis/infiltrates. Small left pleural effusion. No change from prior exam. No pneumothorax. IMPRESSION: 1. Persistent mediastinal fullness. Again adenopathy could present this fashion. Again contrast-enhanced CT of the chest is recommended for further evaluation. 2. Low lung volumes with persistent bibasilar atelectasis/infiltrates. Small left pleural effusion. No interim change from prior exam. Electronically Signed   By: Maisie Fus  Register   On: 03/23/2017 07:05   Dg Chest Port 1 View  Result Date: 03/21/2017 CLINICAL DATA:  Acute respiratory failure.  Hypoxemia. EXAM: PORTABLE CHEST 1 VIEW COMPARISON:  Single-view of the chest 03/15/2017, 03/18/2017 and 03/20/2017. FINDINGS: The patient has a new moderate left pleural effusion and basilar  airspace disease. The right lung is clear. There is abnormal soft tissue density and widening of the right paratracheal stripe highly suspicious for lymphadenopathy. Heart size is normal. IMPRESSION: New left effusion and airspace disease worrisome for pneumonia. Findings suspicious for mediastinal lymphadenopathy. CT chest with  contrast is recommended for further evaluation. Electronically Signed   By: Drusilla Kanner M.D.   On: 03/21/2017 10:52   Dg Chest Port 1 View  Result Date: 03/20/2017 CLINICAL DATA:  Respiratory failure EXAM: PORTABLE CHEST 1 VIEW COMPARISON:  March 18, 2017. FINDINGS: There is a small left pleural effusion with patchy bibasilar atelectasis. There is mild consolidation in the medial left base. Heart is upper normal in size with pulmonary vascularity within normal limits. There is aortic atherosclerosis. No adenopathy. No bone lesions. IMPRESSION: Bibasilar atelectasis with small pleural effusions. Focal consolidation medial left base, new from most recent study and likely representing focal pneumonia. Stable cardiac silhouette.  There is aortic atherosclerosis. Aortic Atherosclerosis (ICD10-I70.0). Electronically Signed   By: Bretta Bang III M.D.   On: 03/20/2017 07:02   Dg Chest Port 1 View  Result Date: 03/18/2017 CLINICAL DATA:  Acute respiratory failure. History of hypertension, smoker. EXAM: PORTABLE CHEST 1 VIEW COMPARISON:  Chest x-ray from earlier same day and chest x-ray dated 03/15/2017. FINDINGS: Study is again hypoinspiratory with crowding of the perihilar and bibasilar bronchovascular markings. Persistent bibasilar opacities, most likely atelectasis. Probable small left pleural effusion with associated blunting of the left costophrenic angle. No new lung findings. No pneumothorax seen Heart size and mediastinal contours are stable. Again noted is prominence of the right upper paratracheal mediastinum suspicious for mass or lymphadenopathy. IMPRESSION: 1. Stable chest x-ray. Evaluation again limited by low lung volumes. Probable bibasilar atelectasis and small left pleural effusion. 2. Persistent prominence of the right paratracheal mediastinum suspicious for mass or lymphadenopathy. Recommend chest CT for further characterization. Electronically Signed   By: Bary Richard M.D.   On:  03/18/2017 23:59   Dg Chest Port 1 View  Result Date: 03/18/2017 CLINICAL DATA:  Acute respiratory failure. Cough and short of breath EXAM: PORTABLE CHEST 1 VIEW COMPARISON:  03/15/2017 FINDINGS: Hypoventilation with interval increase in bibasilar atelectasis. Possible small left effusion Thickened soft tissues in the right paratracheal region unchanged. Possible adenopathy. Negative for heart failure IMPRESSION: Progressive hypoventilation and bibasilar atelectasis Possible mediastinal adenopathy. Recommend CT chest with contrast for further evaluation. Electronically Signed   By: Marlan Palau M.D.   On: 03/18/2017 09:07   Dg C-arm 1-60 Min-no Report  Result Date: 03/15/2017 Fluoroscopy was utilized by the requesting physician.  No radiographic interpretation.   ECHOCARDIOGRAM  ------------------------------------------------------------------- Study Conclusions  - Left ventricle: The cavity size was normal. Wall thickness was   increased in a pattern of moderate LVH. Systolic function was   normal. The estimated ejection fraction was in the range of 60%   to 65%. Wall motion was normal; there were no regional wall   motion abnormalities. Doppler parameters are consistent with   abnormal left ventricular relaxation (grade 1 diastolic   dysfunction). - Pericardium, extracardiac: A small pericardial effusion was   identified circumferential to the heart. There was no evidence of   hemodynamic compromise.   Subjective:   Discharge Exam: Vitals:   03/29/17 0351 03/29/17 1224  BP: 136/60 (!) 153/67  Pulse: 95   Resp: (!) 21   Temp: 98 F (36.7 C)   SpO2: 95%    Vitals:   03/28/17 1321 03/28/17 2056 03/29/17 0351  03/29/17 1224  BP: (!) 148/73 128/71 136/60 (!) 153/67  Pulse: 97 94 95   Resp: (!) 22 (!) 22 (!) 21   Temp: 98.4 F (36.9 C) 99.7 F (37.6 C) 98 F (36.7 C)   TempSrc: Oral Oral Oral   SpO2: 93% 94% 95%   Weight:      Height:       General: Pt is awake  but not alert, not in acute distress; Is somewhat confused but interactive  Cardiovascular: RRR, S1/S2 +, no rubs, no gallops Respiratory: Diminished bilaterally with some rhonchi; No appreciable wheezing and no crackles Abdominal: Soft, NT, Distended due to body habitus, bowel sounds + Extremities: no edema, no cyanosis  The results of significant diagnostics from this hospitalization (including imaging, microbiology, ancillary and laboratory) are listed below for reference.    Microbiology: Recent Results (from the past 240 hour(s))  Culture, blood (Routine X 2) w Reflex to ID Panel     Status: Abnormal   Collection Time: 03/20/17 12:58 PM  Result Value Ref Range Status   Specimen Description   Final    BLOOD LEFT HAND Performed at [facility], [address] [place], [place] [address]    Special Requests   Final    BOTTLES DRAWN AEROBIC ONLY Blood Culture adequate volume Performed at [facility], [address] [place], [place] [address]    Culture  Setup Time   Final    GRAM NEGATIVE RODS AEROBIC BOTTLE ONLY CRITICAL RESULT CALLED TO, READ BACK BY AND VERIFIED WITH: M. Derrick Ravelenz Pharm.D. 14:20 03/21/17 ([email or web address]) Performed at [facility], [address] [place], [place] [address]    Culture PSEUDOMONAS AERUGINOSA (A)  Final   Report Status 03/23/2017 FINAL  Final   Organism ID, Bacteria PSEUDOMONAS AERUGINOSA  Final      Susceptibility   Pseudomonas aeruginosa - MIC*    CEFTAZIDIME 4 SENSITIVE Sensitive     CIPROFLOXACIN <=0.25 SENSITIVE Sensitive     GENTAMICIN <=1 SENSITIVE Sensitive     IMIPENEM 1 SENSITIVE Sensitive     PIP/TAZO 8 SENSITIVE Sensitive     CEFEPIME 2 SENSITIVE Sensitive     * PSEUDOMONAS AERUGINOSA  Blood Culture ID Panel (Reflexed)     Status: Abnormal   Collection Time: 03/20/17 12:58 PM  Result Value Ref Range Status   Enterococcus species NOT DETECTED NOT DETECTED Final   Listeria monocytogenes NOT  DETECTED NOT DETECTED Final   Staphylococcus species NOT DETECTED NOT DETECTED Final   Staphylococcus aureus NOT DETECTED NOT DETECTED Final   Streptococcus species NOT DETECTED NOT DETECTED Final   Streptococcus agalactiae NOT DETECTED NOT DETECTED Final   Streptococcus pneumoniae NOT DETECTED NOT DETECTED Final   Streptococcus pyogenes NOT DETECTED NOT DETECTED Final   Acinetobacter baumannii NOT DETECTED NOT DETECTED Final   Enterobacteriaceae species NOT DETECTED NOT DETECTED Final   Enterobacter cloacae complex NOT DETECTED NOT DETECTED Final   Escherichia coli NOT DETECTED NOT DETECTED Final   Klebsiella oxytoca NOT DETECTED NOT DETECTED Final   Klebsiella pneumoniae NOT DETECTED NOT DETECTED Final   Proteus species NOT DETECTED NOT DETECTED Final   Serratia marcescens NOT DETECTED NOT DETECTED Final   Carbapenem resistance NOT DETECTED NOT DETECTED Final   Haemophilus influenzae NOT DETECTED NOT DETECTED Final   Neisseria meningitidis NOT DETECTED NOT DETECTED Final   Pseudomonas aeruginosa DETECTED (A) NOT DETECTED Final    Comment: CRITICAL RESULT CALLED TO, READ BACK BY AND VERIFIED WITH:  Lynder Parents Pharm.D. 14:20 03/21/17 ([email or web address])    Candida albicans NOT DETECTED NOT DETECTED Final   Candida glabrata NOT DETECTED NOT DETECTED Final   Candida krusei NOT DETECTED NOT DETECTED Final   Candida parapsilosis NOT DETECTED NOT DETECTED Final   Candida tropicalis NOT DETECTED NOT DETECTED Final  Culture, blood (Routine X 2) w Reflex to ID Panel     Status: None   Collection Time: 03/20/17  1:11 PM  Result Value Ref Range Status   Specimen Description   Final    BLOOD LEFT HAND Performed at [facility], [address] [place], [place] [address]    Special Requests   Final    BOTTLES DRAWN AEROBIC ONLY Blood Culture adequate volume Performed at [facility], [address] [place], [place] [address]    Culture   Final    NO GROWTH 5  DAYS Performed at [facility], [address] [place], [place] [address]    Report Status 03/25/2017 FINAL  Final  Urine Culture     Status: None   Collection Time: 03/23/17  8:30 AM  Result Value Ref Range Status   Specimen Description   Final    URINE, CLEAN CATCH Performed at [facility], [address], [place], [place] [address]    Special Requests   Final    Normal Performed at [facility], [address], [place], [place] [address]    Culture   Final    NO GROWTH Performed at [facility], [address] [place], [place] [address]    Report Status 03/24/2017 FINAL  Final    Labs: BNP (last 3 results) Recent Labs    03/21/17 1114  BNP 126.7*   Basic Metabolic Panel: Recent Labs  Lab 03/23/17 1858 03/24/17 0250 03/27/17 0502 03/28/17 0506 03/29/17 0443  NA 134* 132* 127* 127* 129*  K 4.3 4.1 3.9 3.9 4.0  CL 97* 97* 92* 92* 92*  CO2 27 25 23 26 29   GLUCOSE 115* 115* 126* 101* 94  BUN 12 12 13 13 13   CREATININE 0.61 0.60* 0.66 0.62 0.66  CALCIUM 9.3 9.1 9.0 9.1 9.1   Liver Function Tests: Recent Labs  Lab 03/23/17 1858  AST 39  ALT 40  ALKPHOS 84  BILITOT 0.9  PROT 6.8  ALBUMIN 3.4*   No results for input(s): LIPASE, AMYLASE in the last 168 hours. No results for input(s): AMMONIA in the last 168 hours. CBC: Recent Labs  Lab 03/23/17 0316 03/24/17 0250 03/27/17 0502 03/28/17 0506 03/29/17 0443  WBC 14.1* 15.2* 22.2* 16.4* 14.9*  HGB 8.2* 8.4* 8.4* 8.2* 8.3*  HCT 23.0* 24.1* 24.2* 23.8* 23.8*  MCV 89.5 90.6 90.0 90.8 92.6  PLT 308 355 337 322 328   Cardiac Enzymes: No results for input(s): CKTOTAL, CKMB, CKMBINDEX, TROPONINI in the last 168 hours. BNP: Invalid input(s): POCBNP CBG: No results for input(s): GLUCAP in the last 168 hours. D-Dimer No results for input(s): DDIMER in the last 72 hours. Hgb A1c No results for input(s): HGBA1C in the last 72 hours. Lipid Profile No  results for input(s): CHOL, HDL, LDLCALC, TRIG, CHOLHDL, LDLDIRECT in the last 72 hours. Thyroid function studies No results for input(s): TSH, T4TOTAL, T3FREE, THYROIDAB in the last 72 hours.  Invalid input(s): FREET3 Anemia work up No results for input(s): VITAMINB12, FOLATE, FERRITIN, TIBC, IRON, RETICCTPCT in the last 72 hours. Urinalysis    Component Value Date/Time  COLORURINE YELLOW 03/22/2017 1430   APPEARANCEUR CLOUDY (A) 03/22/2017 1430   LABSPEC 1.013 03/22/2017 1430   PHURINE 6.0 03/22/2017 1430   GLUCOSEU NEGATIVE 03/22/2017 1430   HGBUR MODERATE (A) 03/22/2017 1430   BILIRUBINUR NEGATIVE 03/22/2017 1430   KETONESUR NEGATIVE 03/22/2017 1430   PROTEINUR NEGATIVE 03/22/2017 1430   NITRITE NEGATIVE 03/22/2017 1430   LEUKOCYTESUR LARGE (A) 03/22/2017 1430   Sepsis Labs Invalid input(s): PROCALCITONIN,  WBC,  LACTICIDVEN Microbiology Recent Results (from the past 240 hour(s))  Culture, blood (Routine X 2) w Reflex to ID Panel     Status: Abnormal   Collection Time: 03/20/17 12:58 PM  Result Value Ref Range Status   Specimen Description   Final    BLOOD LEFT HAND Performed at [facility], [address] [place], [place] [address]    Special Requests   Final    BOTTLES DRAWN AEROBIC ONLY Blood Culture adequate volume Performed at [facility], [address], [place], [place] [address]    Culture  Setup Time   Final    GRAM NEGATIVE RODS AEROBIC BOTTLE ONLY CRITICAL RESULT CALLED TO, READ BACK BY AND VERIFIED WITH: M. Derrick Ravel Pharm.D. 14:20 03/21/17 ([email or web address]) Performed at [facility], [address], [place], [place] [address]    Culture PSEUDOMONAS AERUGINOSA (A)  Final   Report Status 03/23/2017 FINAL  Final   Organism ID, Bacteria PSEUDOMONAS AERUGINOSA  Final      Susceptibility   Pseudomonas aeruginosa - MIC*    CEFTAZIDIME 4 SENSITIVE Sensitive     CIPROFLOXACIN <=0.25 SENSITIVE Sensitive     GENTAMICIN <=1  SENSITIVE Sensitive     IMIPENEM 1 SENSITIVE Sensitive     PIP/TAZO 8 SENSITIVE Sensitive     CEFEPIME 2 SENSITIVE Sensitive     * PSEUDOMONAS AERUGINOSA  Blood Culture ID Panel (Reflexed)     Status: Abnormal   Collection Time: 03/20/17 12:58 PM  Result Value Ref Range Status   Enterococcus species NOT DETECTED NOT DETECTED Final   Listeria monocytogenes NOT DETECTED NOT DETECTED Final   Staphylococcus species NOT DETECTED NOT DETECTED Final   Staphylococcus aureus NOT DETECTED NOT DETECTED Final   Streptococcus species NOT DETECTED NOT DETECTED Final   Streptococcus agalactiae NOT DETECTED NOT DETECTED Final   Streptococcus pneumoniae NOT DETECTED NOT DETECTED Final   Streptococcus pyogenes NOT DETECTED NOT DETECTED Final   Acinetobacter baumannii NOT DETECTED NOT DETECTED Final   Enterobacteriaceae species NOT DETECTED NOT DETECTED Final   Enterobacter cloacae complex NOT DETECTED NOT DETECTED Final   Escherichia coli NOT DETECTED NOT DETECTED Final   Klebsiella oxytoca NOT DETECTED NOT DETECTED Final   Klebsiella pneumoniae NOT DETECTED NOT DETECTED Final   Proteus species NOT DETECTED NOT DETECTED Final   Serratia marcescens NOT DETECTED NOT DETECTED Final   Carbapenem resistance NOT DETECTED NOT DETECTED Final   Haemophilus influenzae NOT DETECTED NOT DETECTED Final   Neisseria meningitidis NOT DETECTED NOT DETECTED Final   Pseudomonas aeruginosa DETECTED (A) NOT DETECTED Final    Comment: CRITICAL RESULT CALLED TO, READ BACK BY AND VERIFIED WITH: M. Derrick Ravel Pharm.D. 14:20 03/21/17 ([email or web address])    Candida albicans NOT DETECTED NOT DETECTED Final   Candida glabrata NOT DETECTED NOT DETECTED Final   Candida krusei NOT DETECTED NOT DETECTED Final   Candida parapsilosis NOT DETECTED NOT DETECTED Final   Candida tropicalis NOT DETECTED NOT DETECTED Final  Culture, blood (Routine X 2) w Reflex to ID Panel  Status: None   Collection Time: 03/20/17  1:11 PM  Result Value Ref Range  Status   Specimen Description   Final    BLOOD LEFT HAND Performed at [facility], [address] [place], [place] [address]    Special Requests   Final    BOTTLES DRAWN AEROBIC ONLY Blood Culture adequate volume Performed at [facility], [address], [place], [place] [address]    Culture   Final    NO GROWTH 5 DAYS Performed at [facility], [address] [place], [place] [address]    Report Status 03/25/2017 FINAL  Final  Urine Culture     Status: None   Collection Time: 03/23/17  8:30 AM  Result Value Ref Range Status   Specimen Description   Final    URINE, CLEAN CATCH Performed at [facility], [address] [place], [place] [address]    Special Requests   Final    Normal Performed at [facility], [address], [place], [place] [address]    Culture   Final    NO GROWTH Performed at [facility], [address] [place], [place] [address]    Report Status 03/24/2017 FINAL  Final   Time coordinating discharge: 35 minutes  SIGNED:  Merlene Laughter, DO [facility] 03/29/2017, 1:51 PM Pager [phone]  If 7PM-7AM, please contact night-coverage [email or web address] Password [email or web address]

## 2017-03-30 ENCOUNTER — Inpatient Hospital Stay (HOSPITAL_COMMUNITY): Payer: Medicare (Managed Care)

## 2017-03-30 ENCOUNTER — Telehealth: Payer: Self-pay | Admitting: Internal Medicine

## 2017-03-30 ENCOUNTER — Encounter (HOSPITAL_COMMUNITY): Payer: Self-pay | Admitting: Emergency Medicine

## 2017-03-30 ENCOUNTER — Emergency Department (HOSPITAL_COMMUNITY): Payer: Medicare (Managed Care)

## 2017-03-30 ENCOUNTER — Inpatient Hospital Stay (HOSPITAL_COMMUNITY)
Admission: EM | Admit: 2017-03-30 | Discharge: 2017-03-31 | DRG: 291 | Disposition: E | Payer: Medicare (Managed Care) | Attending: Pulmonary Disease | Admitting: Pulmonary Disease

## 2017-03-30 DIAGNOSIS — Y95 Nosocomial condition: Secondary | ICD-10-CM | POA: Diagnosis present

## 2017-03-30 DIAGNOSIS — J9859 Other diseases of mediastinum, not elsewhere classified: Secondary | ICD-10-CM | POA: Diagnosis present

## 2017-03-30 DIAGNOSIS — R579 Shock, unspecified: Secondary | ICD-10-CM | POA: Diagnosis present

## 2017-03-30 DIAGNOSIS — J151 Pneumonia due to Pseudomonas: Secondary | ICD-10-CM | POA: Diagnosis present

## 2017-03-30 DIAGNOSIS — R402312 Coma scale, best motor response, none, at arrival to emergency department: Secondary | ICD-10-CM | POA: Diagnosis present

## 2017-03-30 DIAGNOSIS — E871 Hypo-osmolality and hyponatremia: Secondary | ICD-10-CM | POA: Diagnosis present

## 2017-03-30 DIAGNOSIS — L8990 Pressure ulcer of unspecified site, unspecified stage: Secondary | ICD-10-CM

## 2017-03-30 DIAGNOSIS — R402112 Coma scale, eyes open, never, at arrival to emergency department: Secondary | ICD-10-CM | POA: Diagnosis present

## 2017-03-30 DIAGNOSIS — I110 Hypertensive heart disease with heart failure: Secondary | ICD-10-CM | POA: Diagnosis present

## 2017-03-30 DIAGNOSIS — E669 Obesity, unspecified: Secondary | ICD-10-CM | POA: Diagnosis present

## 2017-03-30 DIAGNOSIS — Z96641 Presence of right artificial hip joint: Secondary | ICD-10-CM | POA: Diagnosis present

## 2017-03-30 DIAGNOSIS — R590 Localized enlarged lymph nodes: Secondary | ICD-10-CM | POA: Diagnosis present

## 2017-03-30 DIAGNOSIS — G931 Anoxic brain damage, not elsewhere classified: Secondary | ICD-10-CM | POA: Diagnosis present

## 2017-03-30 DIAGNOSIS — L89322 Pressure ulcer of left buttock, stage 2: Secondary | ICD-10-CM | POA: Diagnosis present

## 2017-03-30 DIAGNOSIS — D649 Anemia, unspecified: Secondary | ICD-10-CM | POA: Diagnosis present

## 2017-03-30 DIAGNOSIS — J90 Pleural effusion, not elsewhere classified: Secondary | ICD-10-CM | POA: Diagnosis present

## 2017-03-30 DIAGNOSIS — Z66 Do not resuscitate: Secondary | ICD-10-CM | POA: Diagnosis not present

## 2017-03-30 DIAGNOSIS — Z515 Encounter for palliative care: Secondary | ICD-10-CM | POA: Diagnosis not present

## 2017-03-30 DIAGNOSIS — F17210 Nicotine dependence, cigarettes, uncomplicated: Secondary | ICD-10-CM | POA: Diagnosis present

## 2017-03-30 DIAGNOSIS — R402212 Coma scale, best verbal response, none, at arrival to emergency department: Secondary | ICD-10-CM | POA: Diagnosis present

## 2017-03-30 DIAGNOSIS — I5032 Chronic diastolic (congestive) heart failure: Secondary | ICD-10-CM | POA: Diagnosis present

## 2017-03-30 DIAGNOSIS — I5030 Unspecified diastolic (congestive) heart failure: Secondary | ICD-10-CM | POA: Diagnosis not present

## 2017-03-30 DIAGNOSIS — Z6826 Body mass index (BMI) 26.0-26.9, adult: Secondary | ICD-10-CM

## 2017-03-30 DIAGNOSIS — Z978 Presence of other specified devices: Secondary | ICD-10-CM | POA: Diagnosis not present

## 2017-03-30 DIAGNOSIS — G253 Myoclonus: Secondary | ICD-10-CM | POA: Diagnosis present

## 2017-03-30 DIAGNOSIS — Z880 Allergy status to penicillin: Secondary | ICD-10-CM

## 2017-03-30 DIAGNOSIS — Z7901 Long term (current) use of anticoagulants: Secondary | ICD-10-CM

## 2017-03-30 DIAGNOSIS — Z809 Family history of malignant neoplasm, unspecified: Secondary | ICD-10-CM

## 2017-03-30 DIAGNOSIS — I871 Compression of vein: Secondary | ICD-10-CM | POA: Diagnosis present

## 2017-03-30 DIAGNOSIS — Z882 Allergy status to sulfonamides status: Secondary | ICD-10-CM

## 2017-03-30 DIAGNOSIS — I469 Cardiac arrest, cause unspecified: Secondary | ICD-10-CM | POA: Diagnosis present

## 2017-03-30 LAB — URINALYSIS, ROUTINE W REFLEX MICROSCOPIC
Bilirubin Urine: NEGATIVE
GLUCOSE, UA: 50 mg/dL — AB
KETONES UR: 5 mg/dL — AB
Nitrite: NEGATIVE
PROTEIN: 100 mg/dL — AB
Specific Gravity, Urine: 1.023 (ref 1.005–1.030)
pH: 6 (ref 5.0–8.0)

## 2017-03-30 LAB — ECHOCARDIOGRAM COMPLETE
Ao-asc: 41 cm
CHL CUP RV SYS PRESS: 38 mmHg
CHL CUP TV REG PEAK VELOCITY: 241 cm/s
E decel time: 303 msec
EERAT: 8
FS: 39 % (ref 28–44)
HEIGHTINCHES: 72 in
IVS/LV PW RATIO, ED: 1.02
LA ID, A-P, ES: 40 mm
LA diam end sys: 40 mm
LA diam index: 1.88 cm/m2
LA vol A4C: 51 ml
LA vol index: 28.1 mL/m2
LAVOL: 59.7 mL
LV E/e'average: 8
LV PW d: 10.8 mm — AB (ref 0.6–1.1)
LVEEMED: 8
LVELAT: 8.49 cm/s
LVOT area: 4.52 cm2
LVOT diameter: 24 mm
MV Dec: 303
MV pk E vel: 67.9 m/s
MVPKAVEL: 76.6 m/s
RV LATERAL S' VELOCITY: 15.4 cm/s
RV TAPSE: 23.6 mm
TDI e' lateral: 8.49
TDI e' medial: 7.07
TRMAXVEL: 241 cm/s
WEIGHTICAEL: 3097.02 [oz_av]

## 2017-03-30 LAB — CBC WITH DIFFERENTIAL/PLATELET
BASOS PCT: 0 %
Basophils Absolute: 0 10*3/uL (ref 0.0–0.1)
EOS ABS: 0.2 10*3/uL (ref 0.0–0.7)
Eosinophils Relative: 1 %
HEMATOCRIT: 27.6 % — AB (ref 39.0–52.0)
HEMOGLOBIN: 9.2 g/dL — AB (ref 13.0–17.0)
LYMPHS PCT: 12 %
Lymphs Abs: 2 10*3/uL (ref 0.7–4.0)
MCH: 31.1 pg (ref 26.0–34.0)
MCHC: 33.3 g/dL (ref 30.0–36.0)
MCV: 93.2 fL (ref 78.0–100.0)
Monocytes Absolute: 0.8 10*3/uL (ref 0.1–1.0)
Monocytes Relative: 5 %
NEUTROS ABS: 13.8 10*3/uL — AB (ref 1.7–7.7)
Neutrophils Relative %: 82 %
Platelets: 349 10*3/uL (ref 150–400)
RBC: 2.96 MIL/uL — ABNORMAL LOW (ref 4.22–5.81)
RDW: 13.8 % (ref 11.5–15.5)
WBC: 16.8 10*3/uL — ABNORMAL HIGH (ref 4.0–10.5)

## 2017-03-30 LAB — GLUCOSE, CAPILLARY
GLUCOSE-CAPILLARY: 271 mg/dL — AB (ref 65–99)
Glucose-Capillary: 155 mg/dL — ABNORMAL HIGH (ref 65–99)

## 2017-03-30 LAB — I-STAT ARTERIAL BLOOD GAS, ED
Acid-base deficit: 4 mmol/L — ABNORMAL HIGH (ref 0.0–2.0)
Bicarbonate: 21.3 mmol/L (ref 20.0–28.0)
O2 Saturation: 100 %
PCO2 ART: 33.5 mmHg (ref 32.0–48.0)
PH ART: 7.399 (ref 7.350–7.450)
TCO2: 22 mmol/L (ref 22–32)
pO2, Arterial: 169 mmHg — ABNORMAL HIGH (ref 83.0–108.0)

## 2017-03-30 LAB — BASIC METABOLIC PANEL
ANION GAP: 15 (ref 5–15)
BUN: 12 mg/dL (ref 6–20)
CALCIUM: 9.1 mg/dL (ref 8.9–10.3)
CO2: 21 mmol/L — AB (ref 22–32)
Chloride: 93 mmol/L — ABNORMAL LOW (ref 101–111)
Creatinine, Ser: 0.91 mg/dL (ref 0.61–1.24)
GFR calc Af Amer: >60 mL/min (ref >60)
GFR calc non Af Amer: >60 mL/min (ref >60)
Glucose, Bld: 167 mg/dL — ABNORMAL HIGH (ref 65–99)
Potassium: 3.9 mmol/L (ref 3.5–5.1)
Sodium: 129 mmol/L — ABNORMAL LOW (ref 135–145)

## 2017-03-30 LAB — PROCALCITONIN: Procalcitonin: 0.83 ng/mL

## 2017-03-30 LAB — I-STAT VENOUS BLOOD GAS, ED
Acid-base deficit: 5 mmol/L — ABNORMAL HIGH (ref 0.0–2.0)
Bicarbonate: 22.5 mmol/L (ref 20.0–28.0)
O2 Saturation: 73 %
PH VEN: 7.277 (ref 7.250–7.430)
TCO2: 24 mmol/L (ref 22–32)
pCO2, Ven: 47 mmHg (ref 44.0–60.0)
pO2, Ven: 40 mmHg (ref 32.0–45.0)

## 2017-03-30 LAB — I-STAT TROPONIN, ED: Troponin i, poc: 0.01 ng/mL (ref 0.00–0.08)

## 2017-03-30 LAB — LACTIC ACID, PLASMA
LACTIC ACID, VENOUS: 2.6 mmol/L — AB (ref 0.5–1.9)
Lactic Acid, Venous: 2.2 mmol/L (ref 0.5–1.9)

## 2017-03-30 LAB — TROPONIN I
Troponin I: 0.03 ng/mL (ref <0.03)
Troponin I: 0.05 ng/mL (ref <0.03)

## 2017-03-30 LAB — STREP PNEUMONIAE URINARY ANTIGEN: STREP PNEUMO URINARY ANTIGEN: NEGATIVE

## 2017-03-30 MED ORDER — EPINEPHRINE PF 1 MG/ML IJ SOLN
0.5000 ug/min | INTRAVENOUS | Status: DC
  Administered 2017-03-30: 8 ug/min via INTRAVENOUS
  Filled 2017-03-30: qty 4

## 2017-03-30 MED ORDER — CIPROFLOXACIN IN D5W 400 MG/200ML IV SOLN
400.0000 mg | Freq: Two times a day (BID) | INTRAVENOUS | Status: DC
Start: 2017-03-30 — End: 2017-03-30
  Administered 2017-03-30: 400 mg via INTRAVENOUS
  Filled 2017-03-30: qty 200

## 2017-03-30 MED ORDER — VASOPRESSIN 20 UNIT/ML IV SOLN
0.0300 [IU]/min | INTRAVENOUS | Status: DC
  Administered 2017-03-30: 0.03 [IU]/min via INTRAVENOUS
  Filled 2017-03-30: qty 2

## 2017-03-30 MED ORDER — SODIUM CHLORIDE 0.9% FLUSH
10.0000 mL | Freq: Two times a day (BID) | INTRAVENOUS | Status: DC
  Administered 2017-03-30 (×2): 10 mL

## 2017-03-30 MED ORDER — MIDAZOLAM HCL 2 MG/2ML IJ SOLN: 1.0000 mg | INTRAMUSCULAR | Status: DC | PRN

## 2017-03-30 MED ORDER — MIDAZOLAM HCL 2 MG/2ML IJ SOLN
2.0000 mg | Freq: Once | INTRAMUSCULAR | Status: AC
  Administered 2017-03-30: 2 mg via INTRAVENOUS

## 2017-03-30 MED ORDER — SODIUM CHLORIDE 0.9% FLUSH: 10.0000 mL | INTRAVENOUS | Status: DC | PRN

## 2017-03-30 MED ORDER — MIDAZOLAM HCL 2 MG/2ML IJ SOLN
1.0000 mg | INTRAMUSCULAR | Status: DC | PRN
  Administered 2017-03-30: 1 mg via INTRAVENOUS
  Filled 2017-03-30: qty 2

## 2017-03-30 MED ORDER — ALBUTEROL SULFATE (2.5 MG/3ML) 0.083% IN NEBU: 2.5000 mg | INHALATION_SOLUTION | RESPIRATORY_TRACT | Status: DC | PRN

## 2017-03-30 MED ORDER — THIAMINE HCL 100 MG/ML IJ SOLN
100.0000 mg | Freq: Every day | INTRAMUSCULAR | Status: DC
  Administered 2017-03-30: 100 mg via INTRAVENOUS
  Filled 2017-03-30: qty 2

## 2017-03-30 MED ORDER — SODIUM CHLORIDE 0.9 % IV BOLUS (SEPSIS)
2000.0000 mL | Freq: Once | INTRAVENOUS | Status: AC
  Administered 2017-03-30: 2000 mL via INTRAVENOUS

## 2017-03-30 MED ORDER — SODIUM CHLORIDE 0.9 % IV SOLN
1.0000 g | Freq: Three times a day (TID) | INTRAVENOUS | Status: DC
  Filled 2017-03-30 (×2): qty 1

## 2017-03-30 MED ORDER — EPINEPHRINE PF 1 MG/ML IJ SOLN
0.5000 ug/min | INTRAVENOUS | Status: AC
  Administered 2017-03-30: 2 ug/min via INTRAVENOUS
  Filled 2017-03-30: qty 4

## 2017-03-30 MED ORDER — MIDAZOLAM HCL 2 MG/2ML IJ SOLN
INTRAMUSCULAR | Status: AC
  Filled 2017-03-30: qty 2

## 2017-03-30 MED ORDER — ORAL CARE MOUTH RINSE
15.0000 mL | OROMUCOSAL | Status: DC
  Administered 2017-03-30 (×3): 15 mL via OROMUCOSAL

## 2017-03-30 MED ORDER — HEPARIN SODIUM (PORCINE) 5000 UNIT/ML IJ SOLN
5000.0000 [IU] | Freq: Three times a day (TID) | INTRAMUSCULAR | Status: DC
  Administered 2017-03-30: 5000 [IU] via SUBCUTANEOUS
  Filled 2017-03-30: qty 1

## 2017-03-30 MED ORDER — FOLIC ACID 5 MG/ML IJ SOLN
1.0000 mg | Freq: Every day | INTRAMUSCULAR | Status: DC
  Administered 2017-03-30: 1 mg via INTRAVENOUS
  Filled 2017-03-30: qty 0.2

## 2017-03-30 MED ORDER — CHLORHEXIDINE GLUCONATE 0.12% ORAL RINSE (MEDLINE KIT)
15.0000 mL | Freq: Two times a day (BID) | OROMUCOSAL | Status: DC
  Administered 2017-03-30: 15 mL via OROMUCOSAL

## 2017-03-30 MED ORDER — INSULIN ASPART 100 UNIT/ML ~~LOC~~ SOLN
2.0000 [IU] | SUBCUTANEOUS | Status: DC
  Administered 2017-03-30: 4 [IU] via SUBCUTANEOUS

## 2017-03-30 MED ORDER — SODIUM CHLORIDE 0.9 % IV SOLN: 250.0000 mL | INTRAVENOUS | Status: DC | PRN

## 2017-03-30 MED ORDER — SODIUM CHLORIDE 0.9 % IV SOLN
2.0000 g | Freq: Once | INTRAVENOUS | Status: AC
  Administered 2017-03-30: 2 g via INTRAVENOUS
  Filled 2017-03-30: qty 2

## 2017-03-30 MED ORDER — CHLORHEXIDINE GLUCONATE CLOTH 2 % EX PADS: 6.0000 | MEDICATED_PAD | Freq: Every day | CUTANEOUS | Status: DC

## 2017-03-30 MED ORDER — NOREPINEPHRINE BITARTRATE 1 MG/ML IV SOLN
0.0000 ug/min | INTRAVENOUS | Status: DC
  Administered 2017-03-30: 12 ug/min via INTRAVENOUS
  Administered 2017-03-30: 15 ug/min via INTRAVENOUS
  Filled 2017-03-30 (×3): qty 4

## 2017-03-30 MED ORDER — FENTANYL CITRATE (PF) 100 MCG/2ML IJ SOLN
100.0000 ug | Freq: Once | INTRAMUSCULAR | Status: AC
  Administered 2017-03-30: 100 ug via INTRAVENOUS

## 2017-03-30 MED ORDER — IPRATROPIUM-ALBUTEROL 0.5-2.5 (3) MG/3ML IN SOLN
3.0000 mL | Freq: Four times a day (QID) | RESPIRATORY_TRACT | Status: DC
  Administered 2017-03-30 (×2): 3 mL via RESPIRATORY_TRACT
  Filled 2017-03-30 (×2): qty 3

## 2017-03-30 MED ORDER — MORPHINE BOLUS VIA INFUSION
5.0000 mg | INTRAVENOUS | Status: DC | PRN
  Filled 2017-03-30: qty 20

## 2017-03-30 MED ORDER — IOPAMIDOL (ISOVUE-370) INJECTION 76%
INTRAVENOUS | Status: AC
  Administered 2017-03-30: 100 mL
  Filled 2017-03-30: qty 100

## 2017-03-30 MED ORDER — FENTANYL CITRATE (PF) 100 MCG/2ML IJ SOLN: 50.0000 ug | INTRAMUSCULAR | Status: DC | PRN

## 2017-03-30 MED ORDER — MORPHINE 100MG IN NS 100ML (1MG/ML) PREMIX INFUSION
10.0000 mg/h | INTRAVENOUS | Status: DC
  Filled 2017-03-30: qty 100

## 2017-03-30 MED ORDER — LORAZEPAM BOLUS VIA INFUSION
2.0000 mg | INTRAVENOUS | Status: DC | PRN
  Filled 2017-03-30: qty 5

## 2017-03-30 MED ORDER — GERHARDT'S BUTT CREAM
TOPICAL_CREAM | Freq: Two times a day (BID) | CUTANEOUS | Status: DC
  Administered 2017-03-30: 11:00:00 via TOPICAL
  Filled 2017-03-30: qty 1

## 2017-03-30 MED ORDER — PANTOPRAZOLE SODIUM 40 MG IV SOLR: 40.0000 mg | Freq: Every day | INTRAVENOUS | Status: DC

## 2017-03-30 MED ORDER — EPINEPHRINE PF 1 MG/10ML IJ SOSY
PREFILLED_SYRINGE | INTRAMUSCULAR | Status: AC | PRN
  Administered 2017-03-30: 1 via INTRAVENOUS

## 2017-03-30 MED ORDER — SODIUM CHLORIDE 0.9 % IV SOLN
10.0000 mg/h | INTRAVENOUS | Status: DC
  Administered 2017-03-30: 10 mg/h via INTRAVENOUS
  Filled 2017-03-30: qty 10

## 2017-03-30 MED ORDER — ALBUTEROL SULFATE (2.5 MG/3ML) 0.083% IN NEBU: 2.5000 mg | INHALATION_SOLUTION | Freq: Four times a day (QID) | RESPIRATORY_TRACT | Status: DC

## 2017-03-30 MED ORDER — NOREPINEPHRINE BITARTRATE 1 MG/ML IV SOLN
0.0000 ug/min | Freq: Once | INTRAVENOUS | Status: AC
  Administered 2017-03-30: 10 ug/min via INTRAVENOUS
  Filled 2017-03-30: qty 4

## 2017-03-30 MED ORDER — SODIUM CHLORIDE 0.9 % IV SOLN
INTRAVENOUS | Status: DC
  Administered 2017-03-30: 125 mL via INTRAVENOUS

## 2017-03-30 MED ORDER — EPINEPHRINE PF 1 MG/ML IJ SOLN
0.5000 ug/min | INTRAVENOUS | Status: DC
  Administered 2017-03-30: 9 ug/min via INTRAVENOUS

## 2017-03-30 MED ORDER — FENTANYL CITRATE (PF) 100 MCG/2ML IJ SOLN
INTRAMUSCULAR | Status: AC
Start: 2017-03-30 — End: 2017-03-30
  Filled 2017-03-30: qty 2

## 2017-03-30 MED FILL — Medication: Qty: 1 | Status: AC

## 2017-03-31 NOTE — Progress Notes (Signed)
Patient arrived via EMS. [place] in place. Removed and MD inserted 7.5 ETT, secured 26 @ lip. BBS, ETCO2 confirmed placement.  chest xray ordered. Placed on 678ml/kg VT 610 R-18 100% 5+.

## 2017-03-31 NOTE — Procedures (Signed)
Central Venous Catheter Insertion Procedure Note Randy PickerelKenneth Burgess [id number] March 06, 1945  Procedure: Insertion of Central Venous Catheter Indications: Assessment of intravascular volume, Drug and/or fluid administration and Frequent blood sampling  Procedure Details Consent: Risks of procedure as well as the alternatives and risks of each were explained to the (patient/caregiver).  Consent for procedure obtained. Time Out: Verified patient identification, verified procedure, site/side was marked, verified correct patient position, special equipment/implants available, medications/allergies/relevent history reviewed, required imaging and test results available.  Performed  Maximum sterile technique was used including antiseptics, cap, gloves, gown, hand hygiene, mask and sheet. Skin prep: Chlorhexidine; local anesthetic administered A antimicrobial bonded/coated triple lumen catheter was placed in the right femoral vein due to emergent situation using the Seldinger technique.  Evaluation Blood flow good Complications: No apparent complications Patient did tolerate procedure well. Chest X-ray ordered to verify placement.    Randy KussmaulKatalina Burgess, AGACNP-BC [facility]  Pgr: [phone]  PCCM Pgr: [phone]

## 2017-03-31 NOTE — ED Provider Notes (Addendum)
[facility] EMERGENCY DEPARTMENT Provider Note   CSN: [id number] Arrival date & time: 04-17-17  0115     History   Chief Complaint CPR  HPI Gregori Abril is a 72 y.o. male.  The history is provided by the EMS personnel. The history is limited by the condition of the patient (Unresponsive).  He has history of hypertension, diastolic heart failure, mediastinal mass and was transferred to a skilled nursing facility yesterday following hip replacement surgery.  Hospital stay was complicated by hyponatremia and respiratory failure.  He was last seen by staff at 11:45 PM.  About 1 hour later, he was found to be unresponsive and CPR was started and he was transferred here.  EMS reports initial PEA.  They have given 3 epinephrine injections and did get return of spontaneous circulation and transported him on an epinephrine drip.  Past Medical History:  Diagnosis Date  . DDD (degenerative disc disease), lumbar   . Essential hypertension    + LVH on EKG 02/01/17.  Started hctz 25mg  qd 02/01/17.  . Frequent headaches   . 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 Lequita Halt    Patient Active Problem List   Diagnosis Date Noted  . Acute metabolic encephalopathy 03/27/2017  . Acute respiratory failure with hypoxia and hypercapnia (HCC) 03/27/2017  . Bacteremia due to Pseudomonas 03/27/2017  . Hyponatremia 03/27/2017  . Chronic diastolic CHF (congestive heart failure) (HCC) 03/27/2017  . HTN (hypertension) 03/27/2017  . 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 OA (osteoarthritis) of hip 03/15/2017  . H/O total hip arthroplasty 03/15/2017    Past Surgical History:  Procedure Laterality Date  . CATARACT EXTRACTION, BILATERAL    . TOTAL HIP ARTHROPLASTY Right 03/15/2017   Procedure: RIGHT TOTAL HIP ARTHROPLASTY ANTERIOR APPROACH;  Surgeon: Ollen Gross, MD;  Location: [facility] ORS;  Service: Orthopedics;  Laterality:  Right;  . WISDOM TOOTH EXTRACTION  1980   No excessive bleeding and no problems with the light sedation he was given.       Home Medications    Prior to Admission medications   Medication Sig Start Date End Date Taking? Authorizing Provider  albuterol (PROVENTIL) (2.5 MG/3ML) 0.083% nebulizer solution Take 3 mLs (2.5 mg total) by nebulization every 4 (four) hours as needed for wheezing or shortness of breath. 03/29/17   Marguerita Merles Latif, DO  amLODipine-benazepril (LOTREL) 10-20 MG capsule Take 1 capsule by mouth daily. 02/08/17   McGowen, Maryjean Morn, MD  bisacodyl (DULCOLAX) 10 MG suppository Place 1 suppository (10 mg total) rectally daily as needed for moderate constipation. 03/29/17   Sheikh, Kateri Mc Latif, DO  Carboxymethylcellul-Glycerin (LUBRICATING EYE DROPS OP) Apply 1 drop to eye as needed (dry eyes).    [provider]  ciprofloxacin (CIPRO) 750 MG tablet Take 1 tablet (750 mg total) by mouth 2 (two) times daily. April 17, 2017   Marguerita Merles Latif, DO  docusate sodium (COLACE) 100 MG capsule Take 1 capsule (100 mg total) by mouth 2 (two) times daily. 03/29/17   Marguerita Merles Latif, DO  famotidine (PEPCID) 20 MG tablet Take 1 tablet (20 mg total) by mouth 2 (two) times daily. 03/29/17   Marguerita Merles Latif, DO  ferrous sulfate 325 (65 FE) MG tablet Take 1 tablet (325 mg total) by mouth 2 (two) times daily with a meal. 03/29/17   Sheikh, Kateri Mc Latif, DO  folic acid (FOLVITE) 1 MG tablet Take 1 tablet (1 mg total) by mouth  daily. 02-Apr-2017   Marguerita Merles Latif, DO  guaiFENesin (MUCINEX) 600 MG 12 hr tablet Take 2 tablets (1,200 mg total) by mouth 2 (two) times daily. 03/29/17   Marguerita Merles Latif, DO  HYDROcodone-acetaminophen (NORCO/VICODIN) 5-325 MG tablet Take 1 tablet by mouth every 6 (six) hours as needed for severe pain. 03/29/17   Sheikh, Kateri Mc Latif, DO  nicotine (NICODERM CQ - DOSED IN MG/24 HOURS) 21 mg/24hr patch Place 1 patch (21 mg total) onto the skin daily. 04-02-17   Marguerita Merles Latif, DO  polyethylene glycol [facility] / GLYCOLAX) packet Take 17 g by mouth daily as needed for mild constipation. 03/29/17   Marguerita Merles Latif, DO  risperiDONE (RISPERDAL) 1 MG tablet Take 1 tablet (1 mg total) by mouth at bedtime. 03/29/17   Marguerita Merles Latif, DO  rivaroxaban (XARELTO) 10 MG TABS tablet Take 1 tablet (10 mg total) by mouth daily. 03/29/17   Marguerita Merles Latif, DO  Simethicone (ANTI GAS PO) Take 1-2 capsules by mouth daily as needed (gas).    [provider]  sodium chloride (OCEAN) 0.65 % SOLN nasal spray Place 2 sprays into both nostrils as needed for congestion. 03/29/17   Marguerita Merles Latif, DO  sodium chloride 1 g tablet Take 1 tablet (1 g total) by mouth 2 (two) times daily with a meal. 03/29/17   Marguerita Merles Latif, DO  thiamine 100 MG tablet Take 1 tablet (100 mg total) by mouth daily. 2017-04-02   Merlene Laughter, DO    Family History Family History  Problem Relation Age of Onset  . Diabetes Father   . Cancer Daughter     Social History Social History   Tobacco Use  . Smoking status: Current Every Day Smoker    Packs/day: 1.00    Years: 55.00    Pack years: 55.00    Types: Cigarettes  . Smokeless tobacco: Never Used  Substance Use Topics  . Alcohol use: No    Alcohol/week: 4.8 oz    Types: 8 Cans of beer per week    Frequency: Never    Comment: none since last 1-2 months  . Drug use: No     Allergies   Penicillins and Sulfa antibiotics   Review of Systems Review of Systems  Unable to perform ROS: Patient unresponsive     Physical Exam Updated Vital Signs BP 116/65   Pulse 100   Temp (!) 93.4 F (34.1 C)   Resp (!) 0   Ht 6' (1.829 m)   SpO2 100%   BMI 24.28 kg/m   Physical Exam  Nursing note and vitals reviewed.  72 year old male, apneic and pulseless with [facility] airway in place. Head is normocephalic and atraumatic.  Pupils are 5 mm and sluggishly reactive. Oropharynx is clear. Lungs have symmetric air  movement. Chest is without deformity. Heart has regular rate and rhythm without murmur. Abdomen is soft, flat, without masses or hepatosplenomegaly. Extremities have no cyanosis or edema.  Surgical scar right hip is healing appropriately. Skin is warm and dry without rash. Neurologic: He is completely unresponsive.  GCS is 3.  ED Treatments / Results  Labs (all labs ordered are listed, but only abnormal results are displayed) Labs Reviewed  BASIC METABOLIC PANEL - Abnormal; Notable for the following components:      Result Value   Sodium 129 (*)    Chloride 93 (*)    CO2 21 (*)    Glucose, Bld 167 (*)  All other components within normal limits  CBC WITH DIFFERENTIAL/PLATELET - Abnormal; Notable for the following components:   WBC 16.8 (*)    RBC 2.96 (*)    Hemoglobin 9.2 (*)    HCT 27.6 (*)    Neutro Abs 13.8 (*)    All other components within normal limits  I-STAT VENOUS BLOOD GAS, ED - Abnormal; Notable for the following components:   Acid-base deficit 5.0 (*)    All other components within normal limits  CULTURE, RESPIRATORY (NON-EXPECTORATED)  CULTURE, BLOOD (ROUTINE X 2)  CULTURE, BLOOD (ROUTINE X 2)  PROCALCITONIN  LACTIC ACID, PLASMA  LACTIC ACID, PLASMA  TROPONIN I  TROPONIN I  TROPONIN I  URINALYSIS, ROUTINE W REFLEX MICROSCOPIC  I-STAT TROPONIN, ED    EKG  EKG Interpretation  Date/Time:  Thursday March 30 2017 02:10:06 EST Ventricular Rate:  84 PR Interval:    QRS Duration: 83 QT Interval:  362 QTC Calculation: 428 R Axis:   47 Text Interpretation:  Sinus rhythm Low voltage, extremity and precordial leads When compared with ECG of 03/15/2017, LOW VOLTAGE is now present Confirmed by Dione BoozeGlick, Nolawi Kanady [phone]([id number]) on 2017-10-24 2:15:01 AM       Radiology Ct Chest W Contrast  Result Date: 03/28/2017 CLINICAL DATA:  Staging lung cancer. EXAM: CT CHEST, ABDOMEN, AND PELVIS WITH CONTRAST TECHNIQUE: Multidetector CT imaging of the chest, abdomen and pelvis was  performed following the standard protocol during bolus administration of intravenous contrast. CONTRAST:  100mL ISOVUE-300 IOPAMIDOL (ISOVUE-300) INJECTION 61% COMPARISON:  03/27/2017 FINDINGS: CT CHEST FINDINGS Cardiovascular: The heart size is mildly enlarged. Small pericardial effusion. Aortic atherosclerosis. Calcification in the LAD, left circumflex and RCA coronary artery noted. Mediastinum/Nodes: Again identified is a large right mediastinal mass which measures 8.2 x 8.1 by 11.0 cm. The mass completely occludes the SVC. Extensive right chest wall collaterals are identified. The mass also encases the right brachiocephalic vein and has mass effect upon the aortic arch. Mass effect with leftward deviation of the trachea is also identified. The esophagus is unremarkable. Enlarged subcarinal lymph node measures 1.7 cm, image 28 of series 2. Left-sided pre-vascular lymph node measures 11 mm. Right supraclavicular lymph node measures 1.2 cm short axis, image 5/2. There is a left supraclavicular lymph node which is enlarged measuring 1.5 cm, image 9 of series 2. No hilar adenopathy identified. Lungs/Pleura: Small to moderate bilateral pleural effusions identified. There is partial atelectasis of the right middle lobe and right lower lobe. Passive atelectasis overlying the left pleural effusion and within the lingula noted. Musculoskeletal: No aggressive lytic or sclerotic bone lesions identified. CT ABDOMEN PELVIS FINDINGS Hepatobiliary: Small low density structure within the left lobe of liver measures 1.2 cm and likely represents a small cyst. No suspicious liver abnormalities identified. Gallbladder appears normal. No biliary dilatation. Pancreas: Unremarkable. No pancreatic ductal dilatation or surrounding inflammatory changes. Spleen: Normal in size without focal abnormality. Adrenals/Urinary Tract: Normal appearance of the adrenal glands. Small right kidney cysts. Stone within right extrarenal pelvis measures  1.8 by 1.3 cm. No hydronephrosis. Urinary bladder appears unremarkable. Stomach/Bowel: Stomach is within normal limits. Appendix appears normal. No evidence of bowel wall thickening, distention, or inflammatory changes. Vascular/Lymphatic: Aortic atherosclerosis. No aneurysm. No adenopathy within the abdomen or pelvis. No inguinal adenopathy. Reproductive: Prostate gland enlargement noted. Other: No ascites identified within the abdomen or pelvis. Musculoskeletal: Multi level degenerative disc disease identified within the lumbar spine. Previous right hip arthroplasty. Marked degenerative changes involve the left hip. IMPRESSION: 1. Large right-sided  mediastinal mass is again identified. 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 Encasement of the right brachiocephalic vein is also noted. Findings are compared scratch set assuming non-small cell histology imaging findings are compatible with a T4N3M0 lesion or stage IIIb disease. Further evaluation with PET-CT and tissue sampling is advised. 2. Enlarged bilateral supraclavicular, subcarinal and left-sided mediastinal lymph nodes. 3. Small to moderate bilateral pleural effusions with atelectasis involving the right middle lobe, lingula and both lower lobes. 4. No evidence for osseous metastatic disease and no solid organ or nodal metastasis to the abdomen or pelvis. 5. Aortic atherosclerosis and 3 vessel coronary artery atherosclerotic calcifications. Aortic Atherosclerosis (ICD10-I70.0). 6. Small pericardial effusion. 7. Right renal pelvis calcification without hydronephrosis. Electronically Signed   By: Signa Kell M.D.   On: 03/28/2017 12:08   Ct Abdomen Pelvis W Contrast  Result Date: 03/28/2017 CLINICAL DATA:  Staging lung cancer. EXAM: CT CHEST, ABDOMEN, AND PELVIS WITH CONTRAST TECHNIQUE: Multidetector CT imaging of the chest, abdomen and pelvis was performed following the standard protocol  during bolus administration of intravenous contrast. CONTRAST:  ISOVUE-300 IOPAMIDOL (ISOVUE-300) INJECTION 61% COMPARISON:  03/27/2017 FINDINGS: CT CHEST FINDINGS Cardiovascular: The heart size is mildly enlarged. Small pericardial effusion. Aortic atherosclerosis. Calcification in the LAD, left circumflex and RCA coronary artery noted. Mediastinum/Nodes: Again identified is a large right mediastinal mass which measures 8.2 x 8.1 by 11.0 cm. The mass completely occludes the SVC. Extensive right chest wall collaterals are identified. The mass also encases the right brachiocephalic vein and has mass effect upon the aortic arch. Mass effect with leftward deviation of the trachea is also identified. The esophagus is unremarkable. Enlarged subcarinal lymph node measures 1.7 cm, image 28 of series 2. Left-sided pre-vascular lymph node measures 11 mm. Right supraclavicular lymph node measures 1.2 cm short axis, image 5/2. There is a left supraclavicular lymph node which is enlarged measuring 1.5 cm, image 9 of series 2. No hilar adenopathy identified. Lungs/Pleura: Small to moderate bilateral pleural effusions identified. There is partial atelectasis of the right middle lobe and right lower lobe. Passive atelectasis overlying the left pleural effusion and within the lingula noted. Musculoskeletal: No aggressive lytic or sclerotic bone lesions identified. CT ABDOMEN PELVIS FINDINGS Hepatobiliary: Small low density structure within the left lobe of liver measures 1.2 cm and likely represents a small cyst. No suspicious liver abnormalities identified. Gallbladder appears normal. No biliary dilatation. Pancreas: Unremarkable. No pancreatic ductal dilatation or surrounding inflammatory changes. Spleen: Normal in size without focal abnormality. Adrenals/Urinary Tract: Normal appearance of the adrenal glands. Small right kidney cysts. Stone within right extrarenal pelvis measures 1.8 by 1.3 cm. No hydronephrosis. Urinary  bladder appears unremarkable. Stomach/Bowel: Stomach is within normal limits. Appendix appears normal. No evidence of bowel wall thickening, distention, or inflammatory changes. Vascular/Lymphatic: Aortic atherosclerosis. No aneurysm. No adenopathy within the abdomen or pelvis. No inguinal adenopathy. Reproductive: Prostate gland enlargement noted. Other: No ascites identified within the abdomen or pelvis. Musculoskeletal: Multi level degenerative disc disease identified within the lumbar spine. Previous right hip arthroplasty. Marked degenerative changes involve the left hip. IMPRESSION: 1. Large right-sided mediastinal mass is again identified. 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 Encasement of the right brachiocephalic vein is also noted. Findings are compared scratch set assuming non-small cell histology imaging findings are compatible with a T4N3M0 lesion or stage IIIb disease. Further evaluation with PET-CT and tissue sampling  is advised. 2. Enlarged bilateral supraclavicular, subcarinal and left-sided mediastinal lymph nodes. 3. Small to moderate bilateral pleural effusions with atelectasis involving the right middle lobe, lingula and both lower lobes. 4. No evidence for osseous metastatic disease and no solid organ or nodal metastasis to the abdomen or pelvis. 5. Aortic atherosclerosis and 3 vessel coronary artery atherosclerotic calcifications. Aortic Atherosclerosis (ICD10-I70.0). 6. Small pericardial effusion. 7. Right renal pelvis calcification without hydronephrosis. Electronically Signed   By: Signa Kell M.D.   On: 03/28/2017 12:08   Dg Chest Port 1 View  Result Date: 04/03/17 CLINICAL DATA:  CPR, intubation EXAM: PORTABLE CHEST 1 VIEW COMPARISON:  03/28/2017 FINDINGS: Endotracheal tube is 7 cm above the carina. NG tube tip is in the distal esophagus. Large right paramediastinal mass again noted as seen on CT. No visible  pneumothorax. Left lower lobe atelectasis or infiltrate noted. IMPRESSION: Endotracheal tube 7 cm above the carina. NG tube tip in the distal esophagus. Large right paratracheal mass. Left lower lobe atelectasis or infiltrate. Electronically Signed   By: Charlett Nose M.D.   On: April 03, 2017 02:02    Procedures Procedure Name: Intubation Date/Time: 04/03/17 1:30 AM Performed by: Dione Booze, MD Pre-anesthesia Checklist: Patient identified, Patient being monitored, Suction available, Emergency Drugs available and Timeout performed Oxygen Delivery Method: Ambu bag Preoxygenation: Pre-oxygenation with 100% oxygen Ventilation: Mask ventilation without difficulty Laryngoscope Size: Glidescope and 4 Grade View: Grade I Tube size: 6.5 mm Number of attempts: 2 Airway Equipment and Method: Rigid stylet and Video-laryngoscopy Placement Confirmation: ETT inserted through vocal cords under direct vision,  Breath sounds checked- equal and bilateral and CO2 detector Secured at: 27 cm Tube secured with: ETT holder Dental Injury: Teeth and Oropharynx as per pre-operative assessment  Difficulty Due To: Difficulty was unanticipated and Difficult Airway-  due to edematous airway       Cardiopulmonary Resuscitation (CPR) Procedure Note Directed/Performed by: Dione Booze I personally directed ancillary staff and/or performed CPR in an effort to regain return of spontaneous circulation and to maintain cardiac, neuro and systemic perfusion.   CRITICAL CARE Performed by: Dione Booze Total critical care time: 45 minutes Critical care time was exclusive of separately billable procedures and treating other patients. Critical care was necessary to treat or prevent imminent or life-threatening deterioration. Critical care was time spent personally by me on the following activities: development of treatment plan with patient and/or surrogate as well as nursing, discussions with consultants, evaluation of  patient's response to treatment, examination of patient, obtaining history from patient or surrogate, ordering and performing treatments and interventions, ordering and review of laboratory studies, ordering and review of radiographic studies, pulse oximetry and re-evaluation of patient's condition.  Medications Ordered in ED Medications  0.9 %  sodium chloride infusion (not administered)  heparin injection 5,000 Units (not administered)  pantoprazole (PROTONIX) injection 40 mg (not administered)  norepinephrine (LEVOPHED) 4 mg in dextrose 5 % 250 mL (0.016 mg/mL) infusion (15 mcg/min Intravenous New Bag/Given 04/03/17 0315)  vasopressin (PITRESSIN) 40 Units in sodium chloride 0.9 % 250 mL (0.16 Units/mL) infusion (not administered)  thiamine (B-1) injection 100 mg (not administered)  folic acid injection 1 mg (not administered)  insulin aspart (novoLOG) injection 2-6 Units (not administered)  iopamidol (ISOVUE-370) 76 % injection (not administered)  EPINEPHrine (ADRENALIN) 1 MG/10ML injection (1 Syringe Intravenous Given 03-Apr-2017 0120)  EPINEPHrine (ADRENALIN) 4 mg in dextrose 5 % 250 mL (0.016 mg/mL) infusion (0 mcg/min Intravenous Stopped 2017/04/03 0318)  norepinephrine (LEVOPHED) 4 mg in dextrose  5 % 250 mL (0.016 mg/mL) infusion (0 mcg/min Intravenous Stopped Apr 19, 2017 0318)  fentaNYL (SUBLIMAZE) injection 100 mcg (100 mcg Intravenous Given 2017/04/19 0226)  midazolam (VERSED) injection 2 mg (2 mg Intravenous Given 2017/04/19 0226)  EPINEPHrine (ADRENALIN) 4 mg in dextrose 5 % 250 mL (0.016 mg/mL) infusion (9 mcg/min Intravenous New Bag/Given 04-19-2017 0317)     Initial Impression / Assessment and Plan / ED Course  I have reviewed the triage vital signs and the nursing notes.  Pertinent labs & imaging results that were available during my care of the patient were reviewed by me and considered in my medical decision making (see chart for details).  Patient with out of hospital cardiac arrest with  return of spontaneous circulation, but loss of pulses on arrival in the ED.  CPR was reinitiated.  He is given additional epinephrine.  Epinephrine drip had been temporarily discontinued and was reinstated.  King airway was removed and replaced with endotracheal tube.  He had return of spontaneous circulation once again and this time spontaneous pulses persisted.  His power of attorney has arrived and situation was explained to him.  Decision was made to continue aggressive measures, at least for now.  Blood pressure did drop in spite of epinephrine drip and he was switched to norepinephrine drip.  He is maintaining barely adequate blood pressure at this point.  Case is discussed with Dr. Darrick Penna of critical care service who agrees to admit the patient.  Final Clinical Impressions(s) / ED Diagnoses   Final diagnoses:  Cardiac arrest (HCC)  Hyponatremia  Normochromic normocytic anemia    ED Discharge Orders    None       Dione Booze, MD April 19, 2017 [id number]    Dione Booze, MD April 19, 2017 [phone]

## 2017-03-31 NOTE — Progress Notes (Signed)
RT Note:  ABG on 620-18-100% +5 No changes per CCM.

## 2017-03-31 NOTE — Death Summary Note (Signed)
DEATH SUMMARY   Patient Details  Name: Randy Burgess MRN: [id number] DOB: 03-Feb-1945  Admission/Discharge Information   Admit Date:  2017/04/27  Date of Death: Date of Death: 04/27/17  Time of Death: Time of Death: 1751  Length of Stay: 1  Referring Physician: Jeoffrey Massed, MD   Reason(s) for Hospitalization  cardiac arrest  Diagnoses  Preliminary cause of death:  Secondary Diagnoses (including complications and co-morbidities):  Active Problems:   Hyponatremia   Chronic diastolic heart failure (HCC)   Mediastinal mass   Cardiac arrest (HCC)   Shock (HCC)   Endotracheally intubated   Normocytic anemia   Pseudomonas pneumonia (HCC)   Anoxic encephalopathy (HCC)   Myoclonus   Mediastinal lymphadenopathy   Pleural effusion, bilateral   Pressure injury of skin   Brief Hospital Course (including significant findings, care, treatment, and services provided and events leading to death)  Randy Burgess is a 72 y.o. y/o male with a PMH of obese Caucasian male smoker presented to the [facility] Emergency Department via EMS from [facility], where the patient was found to have experienced cardiac arrest (PEA arrest).  Documentation was confusing however, discussion with the nursing staff suggests that the patient required well over 30 minutes of CPR.  In addition, the patient required an additional round of CPR while in the emergency department.  The patient was transferred to [facility] emergency department with a [place] device in place.  Review of the records suggest that the patient was just discharged (24 hours prior) from [facility], where the patient underwent right hip replacement (03/15/2017).  His hospitalization was complicated by development of hyponatremia, delirium and acute hypoxemic respiratory failure, requiring transfer to the ICU and BiPAP support.  He was treated for healthcare associated pneumonia.   Blood cultures isolated Pseudomonas.  His Pseudomonas infection was treated initially with cefepime.  He was discharged to the rehab facility with 4 additional days of ciprofloxacin to complete a 14-day course of therapy.  Of note, the patient was found to have a large mediastinal mass.  The patient and the patient's healthcare power of attorney were apparently made aware of this.  They declined further diagnostic evaluation.  On arrival to the MICU he was noted to have absent cranial reflexes, no gag, no pupillary or corneal reflexes.  Synchronous with the ventilator.  Continuous myoclonic jerks present.  All of this off of sedation.  His medical power of attorney and friend Rise Patience was made aware of his condition.  We discussed the option of pursuing MRI and further brain diagnostics to further delineate his prognosis of the neurologic recovery, however given the severity of his anoxic brain injury, his medical power of attorney stated that the patient would not want to continue aggressive life support in this way and would prefer the transition to comfort care measures.  The patient was extubated after adequate analgesia was allowed and passed shortly thereafter.    Pertinent Labs and Studies  Significant Diagnostic Studies X-ray Chest Pa Or Ap  Result Date: 03/15/2017 CLINICAL DATA:  Shortness of breath. EXAM: CHEST 1 VIEW COMPARISON:  None. FINDINGS: The patient is slightly rotated to the left. Normal heart size. Accentuation of the right paratracheal stripe is likely due to rotation. Elevation of the left hemidiaphragm. Bibasilar atelectasis. No focal consolidation, pleural effusion, or pneumothorax. No acute osseous abnormality. IMPRESSION: 1. Accentuation of the right paratracheal stripe is likely due to patient rotation. Recommend dedicated  PA and lateral views to exclude underlying adenopathy/mass. 2. Bibasilar atelectasis. Electronically Signed   By: Obie DredgeWilliam T Derry M.D.   On: 03/15/2017  14:43   Ct Head Wo Contrast  Result Date: 09-05-2017 CLINICAL DATA:  72 year old male with altered mental status. EXAM: CT HEAD WITHOUT CONTRAST TECHNIQUE: Contiguous axial images were obtained from the base of the skull through the vertex without intravenous contrast. COMPARISON:  None. FINDINGS: Brain: There is mild age-related atrophy and chronic microvascular ischemic changes. There is no acute intracranial hemorrhage. No mass effect or midline shift. No extra-axial fluid collection. Vascular: No hyperdense vessel or unexpected calcification. Skull: Normal. Negative for fracture or focal lesion. Sinuses/Orbits: Severe diffuse mucoperiosteal thickening of paranasal sinuses with opacification of the ethmoid air cells and nasal passages. The mastoid air cells are clear. Other: An enteric tube is partially visualized in the oropharynx. IMPRESSION: 1. No acute intracranial hemorrhage. 2. Mild age-related atrophy and chronic microvascular ischemic changes. Electronically Signed   By: Elgie CollardArash  Radparvar M.D.   On: 008-07-2017 04:54   Ct Chest Wo Contrast  Result Date: 03/27/2017 CLINICAL DATA:  Abnormal chest radiograph. Congestion, cough and shortness of breath. EXAM: CT CHEST WITHOUT CONTRAST TECHNIQUE: Multidetector CT imaging of the chest was performed following the standard protocol without IV contrast. COMPARISON:  None. FINDINGS: Cardiovascular: There is moderate cardiac enlargement. Small pericardial effusion. Aortic atherosclerosis identified. Calcifications within the LAD and left circumflex coronary artery identified. Mediastinum/Nodes: Normal appearance of the thyroid gland. Normal appearance of the esophagus. Right supraclavicular lymph node measures 1.8 cm, image 16/2. There is a large mediastinal mass centered around the right paratracheal region measuring 7.1 x 8.1 x 10.1 cm, image 57/2 and image 54/7. The tumor exhibits significant mass effect upon the anterior and left lateral wall of the  trachea. Cannot confirm patency of the SVC due to lack of IV contrast material. Tumor extends across the midline into the left paratracheal region. Enlarged subcarinal node measures 1.5 cm, image 75/2. Left-sided pre-vascular lymph node measures 1.2 cm, image 63/2. Lungs/Pleura: Small bilateral pleural effusions identified. Large segmental areas of airspace consolidation or atelectasis noted within the right middle lobe and right lower lobe. Subsegmental atelectasis within the posterior left upper lobe and left lower lobe identified. Scattered tiny nodules are identified within the right upper lobe. These all measure around 3-4 mm. A few calcified granulomas identified within the right lung. Upper Abdomen: Cyst identified within the medial segment of left lobe of liver measuring 1 cm, image 129/2. No acute abnormality noted within the upper abdomen. Degenerative disc disease identified within the thoracic spine. No aggressive lytic or sclerotic bone lesions. Right lower posterior rib fracture appears subacute to chronic, image 160/2. Musculoskeletal: No chest wall mass or suspicious bone lesions identified. IMPRESSION: 1. Large mediastinal mass centered around the right paratracheal region is identified having mass effect upon the trachea and extending across the midline into the left paratracheal space. Finding is highly worrisome for malignancy. Enlarged left-sided prevascular, subcarinal and right supraclavicular lymph nodes identified. Differential considerations include primary bronchogenic carcinoma versus lymphoproliferative disorder. Further evaluation with PET-CT and tissue sampling is advised. 2. Small pericardial effusion. 3. Bilateral pleural effusions and large segmental areas of airspace consolidation/atelectasis in the right middle lobe and right lower lobe. Subsegmental atelectasis noted within the lingula and left lower lobe. 4. Multiple tiny calcified and noncalcified nodules are identified within  the right lung. Nonspecific. 5. Aortic atherosclerosis with LAD and left circumflex coronary artery atherosclerotic calcifications. Aortic Atherosclerosis (ICD10-I70.0). Electronically  Signed   By: Signa Kell M.D.   On: 03/27/2017 11:16   Ct Chest W Contrast  Result Date: 03/28/2017 CLINICAL DATA:  Staging lung cancer. EXAM: CT CHEST, ABDOMEN, AND PELVIS WITH CONTRAST TECHNIQUE: Multidetector CT imaging of the chest, abdomen and pelvis was performed following the standard protocol during bolus administration of intravenous contrast. CONTRAST:  ISOVUE-300 IOPAMIDOL (ISOVUE-300) INJECTION 61% COMPARISON:  03/27/2017 FINDINGS: CT CHEST FINDINGS Cardiovascular: The heart size is mildly enlarged. Small pericardial effusion. Aortic atherosclerosis. Calcification in the LAD, left circumflex and RCA coronary artery noted. Mediastinum/Nodes: Again identified is a large right mediastinal mass which measures 8.2 x 8.1 by 11.0 cm. The mass completely occludes the SVC. Extensive right chest wall collaterals are identified. The mass also encases the right brachiocephalic vein and has mass effect upon the aortic arch. Mass effect with leftward deviation of the trachea is also identified. The esophagus is unremarkable. Enlarged subcarinal lymph node measures 1.7 cm, image 28 of series 2. Left-sided pre-vascular lymph node measures 11 mm. Right supraclavicular lymph node measures 1.2 cm short axis, image 5/2. There is a left supraclavicular lymph node which is enlarged measuring 1.5 cm, image 9 of series 2. No hilar adenopathy identified. Lungs/Pleura: Small to moderate bilateral pleural effusions identified. There is partial atelectasis of the right middle lobe and right lower lobe. Passive atelectasis overlying the left pleural effusion and within the lingula noted. Musculoskeletal: No aggressive lytic or sclerotic bone lesions identified. CT ABDOMEN PELVIS FINDINGS Hepatobiliary: Small low density structure within  the left lobe of liver measures 1.2 cm and likely represents a small cyst. No suspicious liver abnormalities identified. Gallbladder appears normal. No biliary dilatation. Pancreas: Unremarkable. No pancreatic ductal dilatation or surrounding inflammatory changes. Spleen: Normal in size without focal abnormality. Adrenals/Urinary Tract: Normal appearance of the adrenal glands. Small right kidney cysts. Stone within right extrarenal pelvis measures 1.8 by 1.3 cm. No hydronephrosis. Urinary bladder appears unremarkable. Stomach/Bowel: Stomach is within normal limits. Appendix appears normal. No evidence of bowel wall thickening, distention, or inflammatory changes. Vascular/Lymphatic: Aortic atherosclerosis. No aneurysm. No adenopathy within the abdomen or pelvis. No inguinal adenopathy. Reproductive: Prostate gland enlargement noted. Other: No ascites identified within the abdomen or pelvis. Musculoskeletal: Multi level degenerative disc disease identified within the lumbar spine. Previous right hip arthroplasty. Marked degenerative changes involve the left hip. IMPRESSION: 1. Large right-sided mediastinal mass is again identified. 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 Encasement of the right brachiocephalic vein is also noted. Findings are compared scratch set assuming non-small cell histology imaging findings are compatible with a T4N3M0 lesion or stage IIIb disease. Further evaluation with PET-CT and tissue sampling is advised. 2. Enlarged bilateral supraclavicular, subcarinal and left-sided mediastinal lymph nodes. 3. Small to moderate bilateral pleural effusions with atelectasis involving the right middle lobe, lingula and both lower lobes. 4. No evidence for osseous metastatic disease and no solid organ or nodal metastasis to the abdomen or pelvis. 5. Aortic atherosclerosis and 3 vessel coronary artery atherosclerotic calcifications. Aortic  Atherosclerosis (ICD10-I70.0). 6. Small pericardial effusion. 7. Right renal pelvis calcification without hydronephrosis. Electronically Signed   By: Signa Kell M.D.   On: 03/28/2017 12:08   Ct Angio Chest Pe W Or Wo Contrast  Result Date: 04-24-2017 CLINICAL DATA:  72 y/o  M; found unresponsive, post CPR. EXAM: CT ANGIOGRAPHY CHEST WITH CONTRAST TECHNIQUE: Multidetector CT imaging of the chest was performed using the standard protocol during bolus  administration of intravenous contrast. Multiplanar CT image reconstructions and MIPs were obtained to evaluate the vascular anatomy. CONTRAST:  ISOVUE-370 IOPAMIDOL (ISOVUE-370) INJECTION 76% COMPARISON:  03/28/2016 CT chest. FINDINGS: Cardiovascular: Satisfactory opacification of the pulmonary arteries to the segmental level. No evidence of pulmonary embolism. Normal heart size. Moderate pericardial effusion. Severe coronary artery calcification. Mediastinum/Nodes: Stable large right paramediastinal mass measuring 7.9 x 8.7 x 10.7 cm given differences in slice selection and technique. The mass occludes the SVC and there are extensive chest wall collaterals on the right. The mass exerts mass effect on mediastinal structures with leftward deviation of trachea and aortic arch. The right lateral wall of the trachea is likely invaded by the mass but patent. There is an endotracheal tube in situ with tip 3.2 mm above the carina. Stable enlarged prevascular, subcarinal, and bilateral supraclavicular lymphadenopathy. Lungs/Pleura: Increased small left-greater-than-right pleural effusions. Bilateral lower lobe consolidations and patchy opacities in the right middle lobe are increased from prior CT as is atelectasis of the dependent lungs. No pneumothorax. Upper Abdomen: Stable 1.2 cm lucency in left lobe of liver, probably cysts. Enteric tube extends to the proximal stomach. Musculoskeletal: Right anterior third, fourth, fifth, sixth, seventh and left anterior  third, fourth, fifth, sixth, and seventh nondisplaced rib fractures. Review of the MIP images confirms the above findings. IMPRESSION: 1. No pulmonary embolus identified. 2. Increased pleural effusions and dependent atelectasis of the lungs. Underlying pneumonia not excluded. 3. Stable moderate pericardial effusion. 4. Large right paramediastinal mass as well as mediastinal and supraclavicular lymphadenopathy is stable. 5. Bilateral nondisplaced third through seventh anterior rib fractures. No pneumothorax. Electronically Signed   By: Mitzi Hansen M.D.   On: 04-14-17 05:12   Ct Abdomen Pelvis W Contrast  Result Date: 03/28/2017 CLINICAL DATA:  Staging lung cancer. EXAM: CT CHEST, ABDOMEN, AND PELVIS WITH CONTRAST TECHNIQUE: Multidetector CT imaging of the chest, abdomen and pelvis was performed following the standard protocol during bolus administration of intravenous contrast. CONTRAST:  ISOVUE-300 IOPAMIDOL (ISOVUE-300) INJECTION 61% COMPARISON:  03/27/2017 FINDINGS: CT CHEST FINDINGS Cardiovascular: The heart size is mildly enlarged. Small pericardial effusion. Aortic atherosclerosis. Calcification in the LAD, left circumflex and RCA coronary artery noted. Mediastinum/Nodes: Again identified is a large right mediastinal mass which measures 8.2 x 8.1 by 11.0 cm. The mass completely occludes the SVC. Extensive right chest wall collaterals are identified. The mass also encases the right brachiocephalic vein and has mass effect upon the aortic arch. Mass effect with leftward deviation of the trachea is also identified. The esophagus is unremarkable. Enlarged subcarinal lymph node measures 1.7 cm, image 28 of series 2. Left-sided pre-vascular lymph node measures 11 mm. Right supraclavicular lymph node measures 1.2 cm short axis, image 5/2. There is a left supraclavicular lymph node which is enlarged measuring 1.5 cm, image 9 of series 2. No hilar adenopathy identified. Lungs/Pleura: Small to  moderate bilateral pleural effusions identified. There is partial atelectasis of the right middle lobe and right lower lobe. Passive atelectasis overlying the left pleural effusion and within the lingula noted. Musculoskeletal: No aggressive lytic or sclerotic bone lesions identified. CT ABDOMEN PELVIS FINDINGS Hepatobiliary: Small low density structure within the left lobe of liver measures 1.2 cm and likely represents a small cyst. No suspicious liver abnormalities identified. Gallbladder appears normal. No biliary dilatation. Pancreas: Unremarkable. No pancreatic ductal dilatation or surrounding inflammatory changes. Spleen: Normal in size without focal abnormality. Adrenals/Urinary Tract: Normal appearance of the adrenal glands. Small right kidney cysts. Stone within right extrarenal  pelvis measures 1.8 by 1.3 cm. No hydronephrosis. Urinary bladder appears unremarkable. Stomach/Bowel: Stomach is within normal limits. Appendix appears normal. No evidence of bowel wall thickening, distention, or inflammatory changes. Vascular/Lymphatic: Aortic atherosclerosis. No aneurysm. No adenopathy within the abdomen or pelvis. No inguinal adenopathy. Reproductive: Prostate gland enlargement noted. Other: No ascites identified within the abdomen or pelvis. Musculoskeletal: Multi level degenerative disc disease identified within the lumbar spine. Previous right hip arthroplasty. Marked degenerative changes involve the left hip. IMPRESSION: 1. Large right-sided mediastinal mass is again identified. 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 Encasement of the right brachiocephalic vein is also noted. Findings are compared scratch set assuming non-small cell histology imaging findings are compatible with a T4N3M0 lesion or stage IIIb disease. Further evaluation with PET-CT and tissue sampling is advised. 2. Enlarged bilateral supraclavicular, subcarinal and  left-sided mediastinal lymph nodes. 3. Small to moderate bilateral pleural effusions with atelectasis involving the right middle lobe, lingula and both lower lobes. 4. No evidence for osseous metastatic disease and no solid organ or nodal metastasis to the abdomen or pelvis. 5. Aortic atherosclerosis and 3 vessel coronary artery atherosclerotic calcifications. Aortic Atherosclerosis (ICD10-I70.0). 6. Small pericardial effusion. 7. Right renal pelvis calcification without hydronephrosis. Electronically Signed   By: Signa Kell M.D.   On: 03/28/2017 12:08   Dg Pelvis Portable  Result Date: 03/15/2017 CLINICAL DATA:  Right hip replacement. EXAM: PORTABLE PELVIS 1-2 VIEWS COMPARISON:  None. FINDINGS: AP view of the pelvis demonstrates right hip arthroplasty, without acute hardware complication. Severe left hip osteoarthritis including joint space narrowing and subchondral sclerosis. IMPRESSION: Expected appearance after right hip arthroplasty. Electronically Signed   By: Jeronimo Greaves M.D.   On: 03/15/2017 14:43   Dg Chest Port 1 View  Result Date: 04/27/17 CLINICAL DATA:  CPR, intubation EXAM: PORTABLE CHEST 1 VIEW COMPARISON:  03/28/2017 FINDINGS: Endotracheal tube is 7 cm above the carina. NG tube tip is in the distal esophagus. Large right paramediastinal mass again noted as seen on CT. No visible pneumothorax. Left lower lobe atelectasis or infiltrate noted. IMPRESSION: Endotracheal tube 7 cm above the carina. NG tube tip in the distal esophagus. Large right paratracheal mass. Left lower lobe atelectasis or infiltrate. Electronically Signed   By: Charlett Nose M.D.   On: 04-27-2017 02:02   Dg Chest Port 1 View  Result Date: 03/23/2017 CLINICAL DATA:  Respiratory failure. EXAM: PORTABLE CHEST 1 VIEW COMPARISON:  03/21/2016.  03/20/2017.  03/18/2017. FINDINGS: Persistent mediastinal fullness. Again adenopathy could present this fashion. Again contrast-enhanced CT of the chest is recommended for further  evaluation. Heart size stable. Persistent bibasilar atelectasis/infiltrates. Small left pleural effusion. No change from prior exam. No pneumothorax. IMPRESSION: 1. Persistent mediastinal fullness. Again adenopathy could present this fashion. Again contrast-enhanced CT of the chest is recommended for further evaluation. 2. Low lung volumes with persistent bibasilar atelectasis/infiltrates. Small left pleural effusion. No interim change from prior exam. Electronically Signed   By: Maisie Fus  Register   On: 03/23/2017 07:05   Dg Chest Port 1 View  Result Date: 03/21/2017 CLINICAL DATA:  Acute respiratory failure.  Hypoxemia. EXAM: PORTABLE CHEST 1 VIEW COMPARISON:  Single-view of the chest 03/15/2017, 03/18/2017 and 03/20/2017. FINDINGS: The patient has a new moderate left pleural effusion and basilar airspace disease. The right lung is clear. There is abnormal soft tissue density and widening of the right paratracheal stripe highly suspicious for lymphadenopathy. Heart size is normal. IMPRESSION: New left effusion  and airspace disease worrisome for pneumonia. Findings suspicious for mediastinal lymphadenopathy. CT chest with contrast is recommended for further evaluation. Electronically Signed   By: Drusilla Kanner M.D.   On: 03/21/2017 10:52   Dg Chest Port 1 View  Result Date: 03/20/2017 CLINICAL DATA:  Respiratory failure EXAM: PORTABLE CHEST 1 VIEW COMPARISON:  March 18, 2017. FINDINGS: There is a small left pleural effusion with patchy bibasilar atelectasis. There is mild consolidation in the medial left base. Heart is upper normal in size with pulmonary vascularity within normal limits. There is aortic atherosclerosis. No adenopathy. No bone lesions. IMPRESSION: Bibasilar atelectasis with small pleural effusions. Focal consolidation medial left base, new from most recent study and likely representing focal pneumonia. Stable cardiac silhouette.  There is aortic atherosclerosis. Aortic Atherosclerosis  (ICD10-I70.0). Electronically Signed   By: Bretta Bang III M.D.   On: 03/20/2017 07:02   Dg Chest Port 1 View  Result Date: 03/18/2017 CLINICAL DATA:  Acute respiratory failure. History of hypertension, smoker. EXAM: PORTABLE CHEST 1 VIEW COMPARISON:  Chest x-ray from earlier same day and chest x-ray dated 03/15/2017. FINDINGS: Study is again hypoinspiratory with crowding of the perihilar and bibasilar bronchovascular markings. Persistent bibasilar opacities, most likely atelectasis. Probable small left pleural effusion with associated blunting of the left costophrenic angle. No new lung findings. No pneumothorax seen Heart size and mediastinal contours are stable. Again noted is prominence of the right upper paratracheal mediastinum suspicious for mass or lymphadenopathy. IMPRESSION: 1. Stable chest x-ray. Evaluation again limited by low lung volumes. Probable bibasilar atelectasis and small left pleural effusion. 2. Persistent prominence of the right paratracheal mediastinum suspicious for mass or lymphadenopathy. Recommend chest CT for further characterization. Electronically Signed   By: Bary Richard M.D.   On: 03/18/2017 23:59   Dg Chest Port 1 View  Result Date: 03/18/2017 CLINICAL DATA:  Acute respiratory failure. Cough and short of breath EXAM: PORTABLE CHEST 1 VIEW COMPARISON:  03/15/2017 FINDINGS: Hypoventilation with interval increase in bibasilar atelectasis. Possible small left effusion Thickened soft tissues in the right paratracheal region unchanged. Possible adenopathy. Negative for heart failure IMPRESSION: Progressive hypoventilation and bibasilar atelectasis Possible mediastinal adenopathy. Recommend CT chest with contrast for further evaluation. Electronically Signed   By: Marlan Palau M.D.   On: 03/18/2017 09:07   Dg C-arm 1-60 Min-no Report  Result Date: 03/15/2017 Fluoroscopy was utilized by the requesting physician.  No radiographic interpretation.     Microbiology Recent Results (from the past 240 hour(s))  Urine Culture     Status: None   Collection Time: 03/23/17  8:30 AM  Result Value Ref Range Status   Specimen Description   Final    URINE, CLEAN CATCH Performed at [facility], [address] [place], [place] [address]    Special Requests   Final    Normal Performed at [facility], [address] [place], [place] [address]    Culture   Final    NO GROWTH Performed at [facility], [address] [place], [place] [address]    Report Status 03/24/2017 FINAL  Final  Culture, respiratory (NON-Expectorated)     Status: None (Preliminary result)   Collection Time: 04/07/2017  6:32 AM  Result Value Ref Range Status   Specimen Description TRACHEAL ASPIRATE  Final   Special Requests NONE  Final   Gram Stain   Final    FEW WBC PRESENT,BOTH PMN AND MONONUCLEAR RARE GRAM POSITIVE COCCI Performed at [facility]  [facility], [address], [place], [place] [address]    Culture PENDING  Incomplete   Report Status PENDING  Incomplete    Lab Basic Metabolic Panel: Recent Labs  Lab 03/24/17 0250 03/27/17 0502 03/28/17 0506 03/29/17 0443 Apr 25, 2017 0131  NA 132* 127* 127* 129* 129*  K 4.1 3.9 3.9 4.0 3.9  CL 97* 92* 92* 92* 93*  CO2 25 23 26 29  21*  GLUCOSE 115* 126* 101* 94 167*  BUN 12 13 13 13 12   CREATININE 0.60* 0.66 0.62 0.66 0.91  CALCIUM 9.1 9.0 9.1 9.1 9.1   Liver Function Tests: Recent Labs  Lab 03/23/17 1858  AST 39  ALT 40  ALKPHOS 84  BILITOT 0.9  PROT 6.8  ALBUMIN 3.4*   No results for input(s): LIPASE, AMYLASE in the last 168 hours. No results for input(s): AMMONIA in the last 168 hours. CBC: Recent Labs  Lab 03/24/17 0250 03/27/17 0502 03/28/17 0506 03/29/17 0443 04/25/17 0131  WBC 15.2* 22.2* 16.4* 14.9* 16.8*  NEUTROABS  --   --   --   --  13.8*  HGB 8.4* 8.4* 8.2* 8.3* 9.2*  HCT 24.1* 24.2* 23.8* 23.8* 27.6*  MCV 90.6 90.0 90.8 92.6 93.2   PLT 355 337 322 328 349   Cardiac Enzymes: Recent Labs  Lab Apr 25, 2017 0529 2017-04-25 0831  TROPONINI 0.03* 0.05*   Sepsis Labs: Recent Labs  Lab 03/27/17 0502 03/28/17 0506 03/29/17 0443 2017/04/25 0131 04-25-17 0529 2017/04/25 0831  PROCALCITON  --   --   --   --  0.83  --   WBC 22.2* 16.4* 14.9* 16.8*  --   --   LATICACIDVEN  --   --   --   --  2.6* 2.2*    Procedures/Operations  Right femoral central venous catheter placement, right femoral arterial line placement, endotracheal intubation   [place] Rickiya Picariello Apr 25, 2017, 6:56 PM

## 2017-03-31 NOTE — Progress Notes (Signed)
Pharmacy Antibiotic Note  Randy PickerelKenneth Burgess is a 72 y.o. male admitted on 12/07/2017 with pneumonia.  Pharmacy has been consulted for Cefepime/Cipro dosing. Recent history of pan-sensitive pseudomonas bacteremia. WBC 16.8. Renal function ok.   Plan: Cefepime 1g IV q8h Cipro 400 mg IV q12h Trend WBC, temp, renal function  F/U infectious work-up  Height: 6' (182.9 cm) IBW/kg (Calculated) : 77.6  Temp (24hrs), Avg:94.1 F (34.5 C), Min:93.4 F (34.1 C), Max:95.3 F (35.2 C)  Recent Labs  Lab 03/24/17 0250 03/27/17 0502 03/28/17 0506 03/29/17 0443 18-Nov-2017 0131  WBC 15.2* 22.2* 16.4* 14.9* 16.8*  CREATININE 0.60* 0.66 0.62 0.66 0.91    CrCl cannot be calculated (Unknown ideal weight.).    Allergies  Allergen Reactions  . Penicillins     Childhood allergy Has patient had a PCN reaction causing immediate rash, facial/tongue/throat swelling, SOB or lightheadedness with hypotension: Unknown Has patient had a PCN reaction causing severe rash involving mucus membranes or skin necrosis: Unknown Has patient had a PCN reaction that required hospitalization: Unknown Has patient had a PCN reaction occurring within the last 10 years: No If all of the above answers are "NO", then may proceed with Cephalosporin use.   . Sulfa Antibiotics     Unknown reactions      Randy DukeLedford, Randy Burgess 12/07/2017 4:07 AM

## 2017-03-31 NOTE — Progress Notes (Signed)
Patient transported CT without incidence.

## 2017-03-31 NOTE — H&P (Addendum)
HISTORY & PHYSICAL  Patient Name: Randy Burgess MRN: [id number] DOB: 06-May-1945    ADMISSION DATE:  18-Apr-2017 DATE OF SERVICE:  04/18/2017  REFERRING MD:  Dr. Preston Fleeting  CHIEF COMPLAINT:  PEA arrest   HISTORY OF PRESENT ILLNESS  This 72 y.o. obese Caucasian male smoker presented to the [facility] Emergency Department via EMS from [facility], where the patient was found to have experienced cardiac arrest (PEA arrest).  Documentation is a bit confusing; however, discussion with the nursing staff suggests that the patient required well over 30 minutes of CPR.  In addition, the patient has required an additional round of CPR while in the emergency department.  The patient was transferred to [facility] emergency department with a [place] device in place.  Currently, at the time of clinical interview, the [facility] device is not activated.  The patient is on epinephrine and norepinephrine infusion.  He is normotensive with a sinus tachyarrhythmia.  He has been hypothermic since arrival.  He is intubated and on mechanical ventilatory support.  Neurologically, the patient is comatose.  He demonstrates no motor response to noxious stimuli.  He appears to be synchronous with the ventilator set rate; however, and ABG has not yet been obtained to determine whether this suggests absence of respiratory drive.  He is not on any sedative medications.  Review of the records suggest that the patient was just discharged (less than 24 hours ago) from [facility], where the patient underwent right hip replacement (03/15/2017).  His hospitalization was complicated by development of hyponatremia, delirium and acute hypoxemic respiratory failure, requiring transfer to the ICU and BiPAP support.  He was treated for healthcare associated pneumonia.  Blood cultures isolated Pseudomonas.  His Pseudomonas infection was treated initially with cefepime.  He was discharged to the  rehab facility with 4 additional days of ciprofloxacin to complete a 14-day course of therapy.  Of note, the patient was found to have a large mediastinal mass.  The patient and the patient's healthcare power of attorney were apparently made aware of this.  They declined further diagnostic evaluation.  REVIEW OF SYSTEMS This patient is critically ill and cannot provide additional history nor review of systems due to Ventilated and Unable to speak.   PAST MEDICAL/SURGICAL/SOCIAL/FAMILY HISTORIES   Past Medical History:  Diagnosis Date  . DDD (degenerative disc disease), lumbar   . Essential hypertension    + LVH on EKG 02/01/17.  Started hctz 25mg  qd 02/01/17.  . Frequent headaches   . 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 Lequita Halt    Past Surgical History:  Procedure Laterality Date  . CATARACT EXTRACTION, BILATERAL    . TOTAL HIP ARTHROPLASTY Right 03/15/2017   Procedure: RIGHT TOTAL HIP ARTHROPLASTY ANTERIOR APPROACH;  Surgeon: Ollen Gross, MD;  Location: [facility] ORS;  Service: Orthopedics;  Laterality: Right;  . WISDOM TOOTH EXTRACTION  1980   No excessive bleeding and no problems with the light sedation he was given.    Social History   Tobacco Use  . Smoking status: Current Every Day Smoker    Packs/day: 1.00    Years: 55.00    Pack years: 55.00    Types: Cigarettes  . Smokeless tobacco: Never Used  Substance Use Topics  . Alcohol use: No    Alcohol/week: 4.8 oz    Types: 8 Cans of beer per week    Frequency: Never  Comment: none since last 1-2 months    Family History  Problem Relation Age of Onset  . Diabetes Father   . Cancer Daughter      Allergies  Allergen Reactions  . Penicillins     Childhood allergy Has patient had a PCN reaction causing immediate rash, facial/tongue/throat swelling, SOB or lightheadedness with hypotension: Unknown Has patient had a PCN reaction causing severe  rash involving mucus membranes or skin necrosis: Unknown Has patient had a PCN reaction that required hospitalization: Unknown Has patient had a PCN reaction occurring within the last 10 years: No If all of the above answers are "NO", then may proceed with Cephalosporin use.   . Sulfa Antibiotics     Unknown reactions      Prior to Admission medications   Medication Sig Start Date End Date Taking? Authorizing Provider  albuterol (PROVENTIL) (2.5 MG/3ML) 0.083% nebulizer solution Take 3 mLs (2.5 mg total) by nebulization every 4 (four) hours as needed for wheezing or shortness of breath. 03/29/17   Marguerita Merles Latif, DO  amLODipine-benazepril (LOTREL) 10-20 MG capsule Take 1 capsule by mouth daily. 02/08/17   McGowen, Maryjean Morn, MD  bisacodyl (DULCOLAX) 10 MG suppository Place 1 suppository (10 mg total) rectally daily as needed for moderate constipation. 03/29/17   Sheikh, Kateri Mc Latif, DO  Carboxymethylcellul-Glycerin (LUBRICATING EYE DROPS OP) Apply 1 drop to eye as needed (dry eyes).    [provider]  ciprofloxacin (CIPRO) 750 MG tablet Take 1 tablet (750 mg total) by mouth 2 (two) times daily. 2017-04-01   Marguerita Merles Latif, DO  docusate sodium (COLACE) 100 MG capsule Take 1 capsule (100 mg total) by mouth 2 (two) times daily. 03/29/17   Marguerita Merles Latif, DO  famotidine (PEPCID) 20 MG tablet Take 1 tablet (20 mg total) by mouth 2 (two) times daily. 03/29/17   Marguerita Merles Latif, DO  ferrous sulfate 325 (65 FE) MG tablet Take 1 tablet (325 mg total) by mouth 2 (two) times daily with a meal. 03/29/17   Sheikh, Kateri Mc Latif, DO  folic acid (FOLVITE) 1 MG tablet Take 1 tablet (1 mg total) by mouth daily. 01-Apr-2017   Marguerita Merles Latif, DO  guaiFENesin (MUCINEX) 600 MG 12 hr tablet Take 2 tablets (1,200 mg total) by mouth 2 (two) times daily. 03/29/17   Marguerita Merles Latif, DO  HYDROcodone-acetaminophen (NORCO/VICODIN) 5-325 MG tablet Take 1 tablet by mouth every 6 (six) hours as needed for  severe pain. 03/29/17   Sheikh, Kateri Mc Latif, DO  nicotine (NICODERM CQ - DOSED IN MG/24 HOURS) 21 mg/24hr patch Place 1 patch (21 mg total) onto the skin daily. Apr 01, 2017   Marguerita Merles Latif, DO  polyethylene glycol [facility] / GLYCOLAX) packet Take 17 g by mouth daily as needed for mild constipation. 03/29/17   Marguerita Merles Latif, DO  risperiDONE (RISPERDAL) 1 MG tablet Take 1 tablet (1 mg total) by mouth at bedtime. 03/29/17   Marguerita Merles Latif, DO  rivaroxaban (XARELTO) 10 MG TABS tablet Take 1 tablet (10 mg total) by mouth daily. 03/29/17   Marguerita Merles Latif, DO  Simethicone (ANTI GAS PO) Take 1-2 capsules by mouth daily as needed (gas).    [provider]  sodium chloride (OCEAN) 0.65 % SOLN nasal spray Place 2 sprays into both nostrils as needed for congestion. 03/29/17   Marguerita Merles Latif, DO  sodium chloride 1 g tablet Take 1 tablet (1 g total) by mouth 2 (two) times daily with a meal. 03/29/17  Sheikh, Omair Latif, DO  thiamine 100 MG tablet Take 1 tablet (100 mg total) by mouth daily. 2017-04-10   Marguerita Merles Latif, DO    Current Facility-Administered Medications  Medication Dose Route Frequency Provider Last Rate Last Dose  . 0.9 %  sodium chloride infusion  250 mL Intravenous PRN Tobey Grim, NP      . folic acid injection 1 mg  1 mg Intravenous Daily [name] M, NP      . heparin injection 5,000 Units  5,000 Units Subcutaneous Q8H Eubanks, Katalina M, NP      . insulin aspart (novoLOG) injection 2-6 Units  2-6 Units Subcutaneous Q4H [name] M, NP      . iopamidol (ISOVUE-370) 76 % injection           . norepinephrine (LEVOPHED) 4 mg in dextrose 5 % 250 mL (0.016 mg/mL) infusion  0-20 mcg/min Intravenous Titrated Tobey Grim, NP 56.3 mL/hr at 04/10/2017 0315 15 mcg/min at April 10, 2017 0315  . pantoprazole (PROTONIX) injection 40 mg  40 mg Intravenous QHS [name] M, NP      . thiamine (B-1) injection 100 mg  100 mg Intravenous Daily  [name] M, NP      . vasopressin (PITRESSIN) 40 Units in sodium chloride 0.9 % 250 mL (0.16 Units/mL) infusion  0.03 Units/min Intravenous Continuous Tobey Grim, NP       Current Outpatient Medications  Medication Sig Dispense Refill  . albuterol (PROVENTIL) (2.5 MG/3ML) 0.083% nebulizer solution Take 3 mLs (2.5 mg total) by nebulization every 4 (four) hours as needed for wheezing or shortness of breath. 75 mL 12  . amLODipine-benazepril (LOTREL) 10-20 MG capsule Take 1 capsule by mouth daily. 30 capsule 1  . bisacodyl (DULCOLAX) 10 MG suppository Place 1 suppository (10 mg total) rectally daily as needed for moderate constipation. 12 suppository 0  . Carboxymethylcellul-Glycerin (LUBRICATING EYE DROPS OP) Apply 1 drop to eye as needed (dry eyes).    . ciprofloxacin (CIPRO) 750 MG tablet Take 1 tablet (750 mg total) by mouth 2 (two) times daily. 8 tablet 0  . docusate sodium (COLACE) 100 MG capsule Take 1 capsule (100 mg total) by mouth 2 (two) times daily. 10 capsule 0  . famotidine (PEPCID) 20 MG tablet Take 1 tablet (20 mg total) by mouth 2 (two) times daily. 30 tablet 0  . ferrous sulfate 325 (65 FE) MG tablet Take 1 tablet (325 mg total) by mouth 2 (two) times daily with a meal.  3  . folic acid (FOLVITE) 1 MG tablet Take 1 tablet (1 mg total) by mouth daily.    Marland Kitchen guaiFENesin (MUCINEX) 600 MG 12 hr tablet Take 2 tablets (1,200 mg total) by mouth 2 (two) times daily.    Marland Kitchen HYDROcodone-acetaminophen (NORCO/VICODIN) 5-325 MG tablet Take 1 tablet by mouth every 6 (six) hours as needed for severe pain. 7 tablet 0  . nicotine (NICODERM CQ - DOSED IN MG/24 HOURS) 21 mg/24hr patch Place 1 patch (21 mg total) onto the skin daily. 28 patch 0  . polyethylene glycol (MIRALAX / GLYCOLAX) packet Take 17 g by mouth daily as needed for mild constipation. 14 each 0  . risperiDONE (RISPERDAL) 1 MG tablet Take 1 tablet (1 mg total) by mouth at bedtime. 30 tablet   . rivaroxaban (XARELTO) 10 MG  TABS tablet Take 1 tablet (10 mg total) by mouth daily. 30 tablet   . Simethicone (ANTI GAS PO) Take 1-2 capsules by mouth  daily as needed (gas).    . sodium chloride (OCEAN) 0.65 % SOLN nasal spray Place 2 sprays into both nostrils as needed for congestion.  0  . sodium chloride 1 g tablet Take 1 tablet (1 g total) by mouth 2 (two) times daily with a meal. 6 tablet 0  . thiamine 100 MG tablet Take 1 tablet (100 mg total) by mouth daily.       VITAL SIGNS: BP 116/65   Pulse 100   Temp (!) 93.4 F (34.1 C)   Resp (!) 0   Ht 6' (1.829 m)   SpO2 100%   BMI 24.28 kg/m   HEMODYNAMICS:    VENTILATOR SETTINGS: Vent Mode: PRVC FiO2 (%):  [100 %] 100 % Set Rate:  [18 bmp] 18 bmp Vt Set:  [161[620 mL] 620 mL PEEP:  [5 cmH20] 5 cmH20 Plateau Pressure:  [18 cmH20] 18 cmH20  INTAKE / OUTPUT: No intake/output data recorded.  PHYSICAL EXAMINATION: GENERAL: Intubated.  Comatose.  HEAD: normocephalic, atraumatic EYE: Pupils 3 mm, midline.  Nonreactive. NOSE: nares are patent. THROAT/ORAL CAVITY: ETT in situ. NECK: supple, no thyromegaly, no JVD, no lymphadenopathy. Trachea midline. CHEST/LUNG: symmetric in development and expansion. Good air entry. no crackles. No wheezes.  Lucas device in situ. HEART: Regular S1 and S2 without murmur, rub or gallop. ABDOMEN: soft, nontender, nondistended.  Diminished bowel sounds.  No rebound.  No guarding.  No hepatosplenomegaly. EXTREMITIES: Edema: 1+. No cyanosis.  No clubbing. 2+ DP pulses LYMPHATIC: no cervical/axiallary/inguinal lymph nodes appreciated MUSCULOSKELETAL: Status post right hip replacement SKIN: No rash or lesion. NEUROLOGIC: Myoclonic jerks.  Doll's eyes intact.  Left corneal reflex intact.  Right corneal reflex is equivocal.  Synchronous with the ventilator.  Facial symmetry. Babinski absent. No sensory deficit. DTR: 2+ @ R biceps, 2+ @ L biceps, 2+ @ R patellar,  2+ @ L patellar. No cerebellar signs. Gait was not  assessed.   LABS:  BASIC METABOLIC PROFILE Recent Labs  Lab 03/28/17 0506 03/29/17 0443 10-04-2017 0131  NA 127* 129* 129*  K 3.9 4.0 3.9  CL 92* 92* 93*  CO2 26 29 21*  BUN 13 13 12   CREATININE 0.62 0.66 0.91  GLUCOSE 101* 94 167*  CALCIUM 9.1 9.1 9.1    Glucose No results for input(s): GLUCAP in the last 168 hours.  Liver Enzymes Recent Labs  Lab 03/23/17 1858  AST 39  ALT 40  ALKPHOS 84  BILITOT 0.9  ALBUMIN 3.4*    CBC Recent Labs  Lab 03/28/17 0506 03/29/17 0443 10-04-2017 0131  WBC 16.4* 14.9* 16.8*  HGB 8.2* 8.3* 9.2*  HCT 23.8* 23.8* 27.6*  PLT 322 328 349    COAGULATION STUDIES Recent Labs  Lab 03/28/17 0506  INR 1.17    SEPSIS MARKERS No results for input(s): LATICACIDVEN, PROCALCITON, O2SATVEN in the last 168 hours.  ABG No results for input(s): PHART, PCO2ART, PO2ART in the last 168 hours.  Cardiac Enzymes No results for input(s): TROPONINI, PROBNP in the last 168 hours.  Imaging Dg Chest Port 1 View  Result Date: 04-28-17 CLINICAL DATA:  CPR, intubation EXAM: PORTABLE CHEST 1 VIEW COMPARISON:  03/28/2017 FINDINGS: Endotracheal tube is 7 cm above the carina. NG tube tip is in the distal esophagus. Large right paramediastinal mass again noted as seen on CT. No visible pneumothorax. Left lower lobe atelectasis or infiltrate noted. IMPRESSION: Endotracheal tube 7 cm above the carina. NG tube tip in the distal esophagus. Large right paratracheal mass. Left lower  lobe atelectasis or infiltrate. Electronically Signed   By: Charlett Nose M.D.   On: 2017-04-07 02:02    CULTURES: Results for orders placed or performed during the hospital encounter of 03/15/17  MRSA PCR Screening     Status: None   Collection Time: 03/16/17  6:20 AM  Result Value Ref Range Status   MRSA by PCR NEGATIVE NEGATIVE Final    Comment:        The GeneXpert MRSA Assay (FDA approved for NASAL specimens only), is one component of a comprehensive MRSA  colonization surveillance program. It is not intended to diagnose MRSA infection nor to guide or monitor treatment for MRSA infections. Performed at [facility], [address] [place], [place] [address]   Culture, blood (Routine X 2) w Reflex to ID Panel     Status: Abnormal   Collection Time: 03/20/17 12:58 PM  Result Value Ref Range Status   Specimen Description   Final    BLOOD LEFT HAND Performed at [facility], [address], [place], [place] [address]    Special Requests   Final    BOTTLES DRAWN AEROBIC ONLY Blood Culture adequate volume Performed at [facility], [address], [place], [place] [address]    Culture  Setup Time   Final    GRAM NEGATIVE RODS AEROBIC BOTTLE ONLY CRITICAL RESULT CALLED TO, READ BACK BY AND VERIFIED WITH: M. Derrick Ravel Pharm.D. 14:20 03/21/17 ([email or web address]) Performed at [facility], [address] [place], [place] [address]    Culture PSEUDOMONAS AERUGINOSA (A)  Final   Report Status 03/23/2017 FINAL  Final   Organism ID, Bacteria PSEUDOMONAS AERUGINOSA  Final      Susceptibility   Pseudomonas aeruginosa - MIC*    CEFTAZIDIME 4 SENSITIVE Sensitive     CIPROFLOXACIN <=0.25 SENSITIVE Sensitive     GENTAMICIN <=1 SENSITIVE Sensitive     IMIPENEM 1 SENSITIVE Sensitive     PIP/TAZO 8 SENSITIVE Sensitive     CEFEPIME 2 SENSITIVE Sensitive     * PSEUDOMONAS AERUGINOSA  Blood Culture ID Panel (Reflexed)     Status: Abnormal   Collection Time: 03/20/17 12:58 PM  Result Value Ref Range Status   Enterococcus species NOT DETECTED NOT DETECTED Final   Listeria monocytogenes NOT DETECTED NOT DETECTED Final   Staphylococcus species NOT DETECTED NOT DETECTED Final   Staphylococcus aureus NOT DETECTED NOT DETECTED Final   Streptococcus species NOT DETECTED NOT DETECTED Final   Streptococcus agalactiae NOT DETECTED NOT DETECTED Final   Streptococcus pneumoniae NOT DETECTED NOT DETECTED Final    Streptococcus pyogenes NOT DETECTED NOT DETECTED Final   Acinetobacter baumannii NOT DETECTED NOT DETECTED Final   Enterobacteriaceae species NOT DETECTED NOT DETECTED Final   Enterobacter cloacae complex NOT DETECTED NOT DETECTED Final   Escherichia coli NOT DETECTED NOT DETECTED Final   Klebsiella oxytoca NOT DETECTED NOT DETECTED Final   Klebsiella pneumoniae NOT DETECTED NOT DETECTED Final   Proteus species NOT DETECTED NOT DETECTED Final   Serratia marcescens NOT DETECTED NOT DETECTED Final   Carbapenem resistance NOT DETECTED NOT DETECTED Final   Haemophilus influenzae NOT DETECTED NOT DETECTED Final   Neisseria meningitidis NOT DETECTED NOT DETECTED Final   Pseudomonas aeruginosa DETECTED (A) NOT DETECTED Final    Comment: CRITICAL RESULT CALLED TO, READ BACK BY AND VERIFIED WITH: M. Derrick Ravel Pharm.D. 14:20 03/21/17 ([email or web address])    Candida albicans NOT DETECTED NOT DETECTED Final   Candida glabrata NOT DETECTED  NOT DETECTED Final   Candida krusei NOT DETECTED NOT DETECTED Final   Candida parapsilosis NOT DETECTED NOT DETECTED Final   Candida tropicalis NOT DETECTED NOT DETECTED Final  Culture, blood (Routine X 2) w Reflex to ID Panel     Status: None   Collection Time: 03/20/17  1:11 PM  Result Value Ref Range Status   Specimen Description   Final    BLOOD LEFT HAND Performed at [facility], [address] [place], [place] [address]    Special Requests   Final    BOTTLES DRAWN AEROBIC ONLY Blood Culture adequate volume Performed at [facility], [address] [place], [place] [address]    Culture   Final    NO GROWTH 5 DAYS Performed at [facility], [address], [place], [place] [address]    Report Status 03/25/2017 FINAL  Final  Urine Culture     Status: None   Collection Time: 03/23/17  8:30 AM  Result Value Ref Range Status   Specimen Description   Final    URINE, CLEAN CATCH Performed at [facility],  [address], [place], [place] [address]    Special Requests   Final    Normal Performed at [facility], [address] [place], [place] [address]    Culture   Final    NO GROWTH Performed at [facility], [address] [place], [place] [address]    Report Status 03/24/2017 FINAL  Final    ASSESSMENT / PLAN: Active Problems:   Mediastinal mass   Cardiac arrest (HCC)   Shock (HCC)   Endotracheally intubated   Anoxic encephalopathy (HCC)   Mass of right lung   Mediastinal lymphadenopathy   Normocytic anemia   Pleural effusion, bilateral   Hyponatremia   Chronic diastolic heart failure (HCC)   Pseudomonas pneumonia (HCC)   Myoclonus   By systems: PULMONARY  Endotracheal intubation  Bulky mediastinal lymphadenopathy  Bilateral pleural effusions CTA chest to rule out pulmonary embolism DuoNeb q 6 hr Albuterol nebs q 4 hr PRN while intubated  CARDIOVASCULAR  Cardiac arrest/PEA arrest  Shock, unknown etiology  Post-CPR resuscitation  Chronic diastolic heart failure  SVC syndrome Continue Levophed and epinephrine gtts for now CT head without contrast CTA chest. Clinically, highly suspicious for VTE/PE Will place CVC and arterial line Hold antihypertensives  RENAL  Hyponatremia Continue IV fluid resuscitation Hold Risperdal  HEMATOLOGIC  Normocytic anemia, likely blood loss anemia Monitor Hgb  INFECTIOUS  Pseudomonas pneumonia Continue cefepime and ciprofloxacin  NEUROLOGIC  Ischemic/Anoxic encephalopathy Continue supportive care for now Notation is made of the HPOA plans to monitor overnight with anticipated plans to withdraw care, as the patient apparently   Admit to ICU under my service (Attending: Marcelle Smiling, MD) with the diagnoses highlighted above in the active Hospital Problem List (ASSESSMENT).  DVT PROPHYLAXIS: heparin GI PROPHYLAXIS: Protonix  FAMILY  - Updates: HPOA was updated by  [name], NP  - Inter-disciplinary family meet or Palliative Care meeting due by:  04/05/2017  CODE STATUS: DNR. HPOA has dictated that no further CPR should be pursued. That is, in the event of subsequent cardiac arrest or malignant arrhythmia, no resuscitative effort is to be pursued. He understands the poor prognosis. He has asked that we continue current therapy tonight and plans to assess appropriateness of withdrawal of care/transitioning to comfort care in am.  Marcelle Smiling, MD Board Certified by the ABIM,  Pulmonary Diseases & Critical Care Medicine  [facility] Pager: [phone]  Apr 11, 2017, 3:27 AM  Critical care time: 90 minutes. The treatment and management of the patient's condition was required based on the threat of imminent deterioration. This time reflects time spent by the physician evaluating, providing care and managing the critically ill patient's care. The time was spent at the immediate bedside (or on the same floor/unit and dedicated to this patient's care). Time involved in separately billable procedures is NOT included int he critical care time indicated above. Family meeting and update time may be included above if and only if the patient is unable/incompetent to participate in clinical interview and/or decision making, and the discussion was necessary to determining treatment decisions.  Marcelle Smiling, MD   ADDENDUM/ERRATUM: Pressure ulcer (L buttocks): present on admission. PRIMARY DIAGNOSIS: shock, unknown etiology

## 2017-03-31 NOTE — Progress Notes (Addendum)
Progress Note  Patient Name: Randy PickerelKenneth Shambley MRN: [id number] DOB: 09-09-1945    ADMISSION DATE:  05-Oct-2017 DATE OF SERVICE:  05-Oct-2017  REFERRING MD:  Dr. Preston FleetingGlick  CHIEF COMPLAINT:  PEA arrest   HISTORY OF PRESENT ILLNESS  This 72 y.o. obese Caucasian male smoker presented [facility] Emergency Department [facility] EMS [facility], where the patient was found to have experienced cardiac arrest (PEA arrest).  Documentation is a bit confusing; however, discussion with the nursing staff suggests that the patient required well over 30 minutes of CPR.  In addition, the patient has required an additional round of CPR while in the emergency department.  The patient was transferred to [facility] emergency department with a [place] device in place.  Currently, at the time of clinical interview, the [facility] device is not activated.  The patient is on epinephrine and norepinephrine infusion.  He is normotensive with a sinus tachyarrhythmia.  He has been hypothermic since arrival.  He is intubated and on mechanical ventilatory support.  Neurologically, the patient is comatose.  He demonstrates no motor response to noxious stimuli.  He appears to be synchronous with the ventilator set rate; however, and ABG has not yet been obtained to determine whether this suggests absence of respiratory drive.  He is not on any sedative medications.  Review of the records suggest that the patient was just discharged (less than 24 hours ago) from [facility], where the patient underwent right hip replacement (03/15/2017).  His hospitalization was complicated by development of hyponatremia, delirium and acute hypoxemic respiratory failure, requiring transfer to the ICU and BiPAP support.  He was treated for healthcare associated pneumonia.  Blood cultures isolated Pseudomonas.  His Pseudomonas infection was treated initially with cefepime.  He was discharged to the  rehab facility with 4 additional days of ciprofloxacin to complete a 14-day course of therapy.  Of note, the patient was found to have a large mediastinal mass.  The patient and the patient's healthcare power of attorney were apparently made aware of this.  They declined further diagnostic evaluation.  SUBJECTIVE:  Overnight he underwent CTA which was negative for pulmonary embolism, as well as a CT of the head which did not show any acute abnormalities.  No other acute events overnight.  Slowly weaning down on vasopressors.  Continues to have myoclonic jerks.  No family or medical power of attorney at bedside.    VITAL SIGNS: BP 99/62   Pulse 95   Temp (!) 96.4 F (35.8 C)   Resp (!) 21   Ht 6' (1.829 m)   Wt 193 lb 9 oz (87.8 kg)   SpO2 100%   BMI 26.25 kg/m   HEMODYNAMICS:    VENTILATOR SETTINGS: Vent Mode: PRVC FiO2 (%):  [50 %-100 %] 50 % Set Rate:  [18 bmp] 18 bmp Vt Set:  [620 mL] 620 mL PEEP:  [5 cmH20] 5 cmH20 Plateau Pressure:  [14 cmH20-21 cmH20] 21 cmH20  INTAKE / OUTPUT: I/O last 3 completed shifts: In: 1907.1 [I.V.:1607.1; IV Piggyback:300] Out: 155 [Urine:155]  PHYSICAL EXAMINATION: GENERAL: Intubated.  Comatose.  HEAD: normocephalic, atraumatic EYE: Pupils 3 mm, midline.  Nonreactive. NOSE: nares are patent. THROAT/ORAL CAVITY: ETT in place. NECK: supple, no thyromegaly, no JVD, no lymphadenopathy. Trachea midline. CHEST/LUNG: symmetric in development and expansion.  ABDOMEN: soft, nontender, nondistended.   EXTREMITIES: Edema: 1+. No cyanosis.  No clubbing.  LYMPHATIC: no cervical/axiallary/inguinal lymph nodes appreciated MUSCULOSKELETAL: Status  post right hip replacement SKIN: No rash or lesion. NEUROLOGIC: Myoclonic jerks. Synchronous with the ventilator.  Facial symmetry.  Does not withdrawal to pain in all 4 extremities.  No corneal reflex.  LABS:  BASIC METABOLIC PROFILE Recent Labs  Lab 03/28/17 0506 03/29/17 0443 04/19/2017 0131  NA 127*  129* 129*  K 3.9 4.0 3.9  CL 92* 92* 93*  CO2 26 29 21*  BUN 13 13 12   CREATININE 0.62 0.66 0.91  GLUCOSE 101* 94 167*  CALCIUM 9.1 9.1 9.1    Glucose Recent Labs  Lab Apr 19, 2017 0857  GLUCAP 271*    Liver Enzymes Recent Labs  Lab 03/23/17 1858  AST 39  ALT 40  ALKPHOS 84  BILITOT 0.9  ALBUMIN 3.4*    CBC Recent Labs  Lab 03/28/17 0506 03/29/17 0443 2017-04-19 0131  WBC 16.4* 14.9* 16.8*  HGB 8.2* 8.3* 9.2*  HCT 23.8* 23.8* 27.6*  PLT 322 328 349    COAGULATION STUDIES Recent Labs  Lab 03/28/17 0506  INR 1.17    SEPSIS MARKERS Recent Labs  Lab 04-19-2017 0529  LATICACIDVEN 2.6*  PROCALCITON 0.83    ABG Recent Labs  Lab 04-19-2017 0343  PHART 7.399  PCO2ART 33.5  PO2ART 169.0*    Cardiac Enzymes Recent Labs  Lab 04/19/2017 0529  TROPONINI 0.03*    Imaging Ct Head Wo Contrast  Result Date: April 19, 2017 CLINICAL DATA:  72 year old male with altered mental status. EXAM: CT HEAD WITHOUT CONTRAST TECHNIQUE: Contiguous axial images were obtained from the base of the skull through the vertex without intravenous contrast. COMPARISON:  None. FINDINGS: Brain: There is mild age-related atrophy and chronic microvascular ischemic changes. There is no acute intracranial hemorrhage. No mass effect or midline shift. No extra-axial fluid collection. Vascular: No hyperdense vessel or unexpected calcification. Skull: Normal. Negative for fracture or focal lesion. Sinuses/Orbits: Severe diffuse mucoperiosteal thickening of paranasal sinuses with opacification of the ethmoid air cells and nasal passages. The mastoid air cells are clear. Other: An enteric tube is partially visualized in the oropharynx. IMPRESSION: 1. No acute intracranial hemorrhage. 2. Mild age-related atrophy and chronic microvascular ischemic changes. Electronically Signed   By: Elgie Collard M.D.   On: 04/19/2017 04:54   Ct Angio Chest Pe W Or Wo Contrast  Result Date: 04-19-2017 CLINICAL DATA:  72  y/o  M; found unresponsive, post CPR. EXAM: CT ANGIOGRAPHY CHEST WITH CONTRAST TECHNIQUE: Multidetector CT imaging of the chest was performed using the standard protocol during bolus administration of intravenous contrast. Multiplanar CT image reconstructions and MIPs were obtained to evaluate the vascular anatomy. CONTRAST:  ISOVUE-370 IOPAMIDOL (ISOVUE-370) INJECTION 76% COMPARISON:  03/28/2016 CT chest. FINDINGS: Cardiovascular: Satisfactory opacification of the pulmonary arteries to the segmental level. No evidence of pulmonary embolism. Normal heart size. Moderate pericardial effusion. Severe coronary artery calcification. Mediastinum/Nodes: Stable large right paramediastinal mass measuring 7.9 x 8.7 x 10.7 cm given differences in slice selection and technique. The mass occludes the SVC and there are extensive chest wall collaterals on the right. The mass exerts mass effect on mediastinal structures with leftward deviation of trachea and aortic arch. The right lateral wall of the trachea is likely invaded by the mass but patent. There is an endotracheal tube in situ with tip 3.2 mm above the carina. Stable enlarged prevascular, subcarinal, and bilateral supraclavicular lymphadenopathy. Lungs/Pleura: Increased small left-greater-than-right pleural effusions. Bilateral lower lobe consolidations and patchy opacities in the right middle lobe are increased from prior CT as is atelectasis of the  dependent lungs. No pneumothorax. Upper Abdomen: Stable 1.2 cm lucency in left lobe of liver, probably cysts. Enteric tube extends to the proximal stomach. Musculoskeletal: Right anterior third, fourth, fifth, sixth, seventh and left anterior third, fourth, fifth, sixth, and seventh nondisplaced rib fractures. Review of the MIP images confirms the above findings. IMPRESSION: 1. No pulmonary embolus identified. 2. Increased pleural effusions and dependent atelectasis of the lungs. Underlying pneumonia not excluded. 3.  Stable moderate pericardial effusion. 4. Large right paramediastinal mass as well as mediastinal and supraclavicular lymphadenopathy is stable. 5. Bilateral nondisplaced third through seventh anterior rib fractures. No pneumothorax. Electronically Signed   By: Mitzi Hansen M.D.   On: 2017/04/03 05:12   Dg Chest Port 1 View  Result Date: 04/03/17 CLINICAL DATA:  CPR, intubation EXAM: PORTABLE CHEST 1 VIEW COMPARISON:  03/28/2017 FINDINGS: Endotracheal tube is 7 cm above the carina. NG tube tip is in the distal esophagus. Large right paramediastinal mass again noted as seen on CT. No visible pneumothorax. Left lower lobe atelectasis or infiltrate noted. IMPRESSION: Endotracheal tube 7 cm above the carina. NG tube tip in the distal esophagus. Large right paratracheal mass. Left lower lobe atelectasis or infiltrate. Electronically Signed   By: Charlett Nose M.D.   On: 04-03-2017 02:02    CULTURES: Results for orders placed or performed during the hospital encounter of 03/15/17  MRSA PCR Screening     Status: None   Collection Time: 03/16/17  6:20 AM  Result Value Ref Range Status   MRSA by PCR NEGATIVE NEGATIVE Final    Comment:        The GeneXpert MRSA Assay (FDA approved for NASAL specimens only), is one component of a comprehensive MRSA colonization surveillance program. It is not intended to diagnose MRSA infection nor to guide or monitor treatment for MRSA infections. Performed at [facility], [address] [place], [place] [address]   Culture, blood (Routine X 2) w Reflex to ID Panel     Status: Abnormal   Collection Time: 03/20/17 12:58 PM  Result Value Ref Range Status   Specimen Description   Final    BLOOD LEFT HAND Performed at [facility], [address] [place], [place] [address]    Special Requests   Final    BOTTLES DRAWN AEROBIC ONLY Blood Culture adequate volume Performed at [facility], [address] [place], [place] [address]    Culture  Setup Time   Final    GRAM NEGATIVE RODS AEROBIC BOTTLE ONLY CRITICAL RESULT CALLED TO, READ BACK BY AND VERIFIED WITH: M. Derrick Ravel Pharm.D. 14:20 03/21/17 ([email or web address]) Performed at [facility], [address] [place], [place] [address]    Culture PSEUDOMONAS AERUGINOSA (A)  Final   Report Status 03/23/2017 FINAL  Final   Organism ID, Bacteria PSEUDOMONAS AERUGINOSA  Final      Susceptibility   Pseudomonas aeruginosa - MIC*    CEFTAZIDIME 4 SENSITIVE Sensitive     CIPROFLOXACIN <=0.25 SENSITIVE Sensitive     GENTAMICIN <=1 SENSITIVE Sensitive     IMIPENEM 1 SENSITIVE Sensitive     PIP/TAZO 8 SENSITIVE Sensitive     CEFEPIME 2 SENSITIVE Sensitive     * PSEUDOMONAS AERUGINOSA  Blood Culture ID Panel (Reflexed)     Status: Abnormal   Collection Time: 03/20/17 12:58 PM  Result Value Ref Range Status   Enterococcus species NOT DETECTED NOT DETECTED Final   Listeria monocytogenes NOT DETECTED NOT DETECTED Final  Staphylococcus species NOT DETECTED NOT DETECTED Final   Staphylococcus aureus NOT DETECTED NOT DETECTED Final   Streptococcus species NOT DETECTED NOT DETECTED Final   Streptococcus agalactiae NOT DETECTED NOT DETECTED Final   Streptococcus pneumoniae NOT DETECTED NOT DETECTED Final   Streptococcus pyogenes NOT DETECTED NOT DETECTED Final   Acinetobacter baumannii NOT DETECTED NOT DETECTED Final   Enterobacteriaceae species NOT DETECTED NOT DETECTED Final   Enterobacter cloacae complex NOT DETECTED NOT DETECTED Final   Escherichia coli NOT DETECTED NOT DETECTED Final   Klebsiella oxytoca NOT DETECTED NOT DETECTED Final   Klebsiella pneumoniae NOT DETECTED NOT DETECTED Final   Proteus species NOT DETECTED NOT DETECTED Final   Serratia marcescens NOT DETECTED NOT DETECTED Final   Carbapenem resistance NOT DETECTED NOT DETECTED Final   Haemophilus influenzae NOT DETECTED NOT DETECTED Final   Neisseria meningitidis NOT DETECTED  NOT DETECTED Final   Pseudomonas aeruginosa DETECTED (A) NOT DETECTED Final    Comment: CRITICAL RESULT CALLED TO, READ BACK BY AND VERIFIED WITH: M. Derrick Ravel Pharm.D. 14:20 03/21/17 ([email or web address])    Candida albicans NOT DETECTED NOT DETECTED Final   Candida glabrata NOT DETECTED NOT DETECTED Final   Candida krusei NOT DETECTED NOT DETECTED Final   Candida parapsilosis NOT DETECTED NOT DETECTED Final   Candida tropicalis NOT DETECTED NOT DETECTED Final  Culture, blood (Routine X 2) w Reflex to ID Panel     Status: None   Collection Time: 03/20/17  1:11 PM  Result Value Ref Range Status   Specimen Description   Final    BLOOD LEFT HAND Performed at [facility], [address], [place], [place] [address]    Special Requests   Final    BOTTLES DRAWN AEROBIC ONLY Blood Culture adequate volume Performed at [facility], [address], [place], [place] [address]    Culture   Final    NO GROWTH 5 DAYS Performed at [facility], [address] [place], [place] [address]    Report Status 03/25/2017 FINAL  Final  Urine Culture     Status: None   Collection Time: 03/23/17  8:30 AM  Result Value Ref Range Status   Specimen Description   Final    URINE, CLEAN CATCH Performed at [facility], [address], [place], [place] [address]    Special Requests   Final    Normal Performed at [facility], [address], [place], [place] [address]    Culture   Final    NO GROWTH Performed at [facility], [address] [place], [place] [address]    Report Status 03/24/2017 FINAL  Final    ASSESSMENT / PLAN: Active Problems:   Hyponatremia   Chronic diastolic heart failure (HCC)   Mediastinal mass   Cardiac arrest (HCC)   Shock (HCC)   Endotracheally intubated   Normocytic anemia   Pseudomonas pneumonia (HCC)   Anoxic encephalopathy (HCC)   Myoclonus   Mediastinal lymphadenopathy   Pleural effusion,  bilateral   Pressure injury of skin   By systems: PULMONARY  Endotracheal intubation  Bulky mediastinal lymphadenopathy  Bilateral pleural effusions CTA chest negative for pulmonary embolism, showed bilateral effusions, known anterior mediastinal mass stable in size with associated supraclavicular lymphadenopathy DuoNeb q 6 hr Albuterol nebs q 4 hr PRN while intubated  CARDIOVASCULAR  Cardiac arrest/PEA arrest  Shock, unknown etiology  Post-CPR resuscitation  Chronic diastolic heart failure  SVC syndrome Continue Levophed  and epinephrine gtts for now, weaning off of aspirin for now CT head without contrast without any acute change CTA chest. Clinically, highly suspicious for VTE/PE however no pulmonary embolism identified  RENAL  Hyponatremia Continue IV fluid resuscitation Hold Risperdal  HEMATOLOGIC  Normocytic anemia, likely blood loss anemia Monitor Hgb  INFECTIOUS  Pseudomonas pneumonia -bibasilar consolidations on CT scan more consistent with atelectasis and effusion, however was noted to previously be colonized with Pseudomonas Continue cefepime, will discontinue ciprofloxacin based on prior sensitivities to cefepime.  Narrow once sensitivities are available  NEUROLOGIC  Ischemic/Anoxic encephalopathy Continue supportive care for now Notation is made of the HPOA plans to monitor overnight with anticipated plans to withdraw care.  I have asked the case manager confirm that his friend, Rise Patience, is confirmed as the medical power of attorney though there are multiple notes during previous admissions referring to him as such.  Once this is confirmed we will reach out to him today to discuss whether he believes that the patient would have opted for comfort care at this time.  His lack of cranial reflexes and presumed myoclonic jerks due to anoxia certainly portend a poor prognosis however for full prognostic evaluation he would need to go and MRI and  possibly further testing   DVT PROPHYLAXIS: heparin GI PROPHYLAXIS: Protonix  FAMILY  - Updates: HPOA was updated by [name], NP  - Inter-disciplinary family meet or Palliative Care meeting due by:  04/05/2017   [place] Aanchal Cope, MD [facility] Pulmonology/Critical Care Pager [phone] After hours pager: [phone]

## 2017-03-31 NOTE — Progress Notes (Signed)
Patient transported from CT scan to 2H19 without incidence. Report given to RT.

## 2017-03-31 NOTE — Progress Notes (Signed)
225ml of morphine wasted in sink with Bronson CurbHannah Padgett, RN.

## 2017-03-31 NOTE — ED Notes (Signed)
No ice packs at this time per EDP

## 2017-03-31 NOTE — ED Notes (Signed)
Family placed in consultation A

## 2017-03-31 NOTE — Telephone Encounter (Signed)
Randy Burgess DR [name] - because I was planning to followup [facility] node [facility]. Patient and dpoa refused followup and biopsy and went to rehab but upon chart review [facility] adm[facility] with [facility]  Dr. Kalman ShanMurali Jhalil Silvera, M.D., [facility].P Pulmonary and Critical Care Medicine Staff Physician, [facility] Center Director - Interstitial Lung Disease  Program  Pulmonary Fibrosis [facility] at [facility] [place], [place], [id number]  Pager: [phone], If no answer or between  15:00h - 7:00h: call [phone] Telephone: [phone]

## 2017-03-31 NOTE — Procedures (Signed)
Arterial Catheter Insertion Procedure Note Fritz PickerelKenneth Garriga [id number] 06-15-1945  Procedure: Insertion of Arterial Catheter  Indications: Blood pressure monitoring and Frequent blood sampling  Procedure Details Consent: Risks of procedure as well as the alternatives and risks of each were explained to the (patient/caregiver).  Consent for procedure obtained. Time Out: Verified patient identification, verified procedure, site/side was marked, verified correct patient position, special equipment/implants available, medications/allergies/relevent history reviewed, required imaging and test results available.  Performed  Maximum sterile technique was used including antiseptics, cap, gloves, gown, hand hygiene, mask and sheet. Skin prep: Chlorhexidine; local anesthetic administered 22 gauge catheter was inserted into right femoral artery using the Seldinger technique.  Evaluation Blood flow good; BP tracing good. Complications: No apparent complications.   Jovita KussmaulKatalina Seven Marengo, AGACNP-BC [place] Pulmonary & Critical Care  Pgr: [phone]  PCCM Pgr: [phone]

## 2017-03-31 NOTE — Plan of Care (Signed)
Plan of Care Note  This afternoon I met with Mr. Randy Burgess, Mr Randy Burgess appointed medical power of attorney in his living will, and explained to him that the patient had suffered a cardiac arrest of unclear cause.  I explained that he has evidence on exam of an anoxic brain injury, as evidenced by his lack of cranial nerve reflexes including absent pupillary, gag and corneal reflexes, as well as myoclonic jerks.  I explained that in order to prognosticate further he would require an MRI and possibly advance brain imaging however evidence we have now is certainly that he had some degree of anoxic brain injury.  At this point I asked Mr. Randy Burgess if he had ever spoken with the patient about what his wishes would have been in this situation, whether to proceed with advanced testing and continue on life support or to consider transitioning to comfort care.  During the course of our conversation Mr. explained that the patient was previously a high performing professional at the [employer], however after his retirement had developed severe alcohol abuse which led to a estrangement of all members of his family.  He stated that he neglected medical care and was unable to fully care for himself at home as a result of this and required Mr. Randy Burgess help.  During these times with Mr. Randy Burgess had tried to convince the patient to seek more medical care, which she declined multiple times prior to his recent hip surgery.  Because of his aversion to health care, Mr. Randy Burgess questioned the patient multiple times about exactly what he would and would not want.  During these discussions the patient had indicated to Mr. Randy Burgess that if he were on life support, not responsive, or "a vegetable" but that he would certainly not want to live on prolonged life support.  I explained that we do not have all of the information that is possible, and that an MRI and additional testing may provide further information to prognosticate his chances of a  neurological recovery, however we do have signs that he sustained a clear anoxic brain injury.  Given this information Mr. Randy Burgess stated that he did not think that the patient would want to support this way believed that he would prefer to transition to comfort care.  He stated that a chaplain would be welcomed by the patient and so 1 was consulted.  I asked if there are any other family or friends that he thinks the patient would like to visit and he stated that there were not.  Stated multiple times that the patient would not want an autopsy though he was unsure of the reasoning behind this.  I explained that in cases in which the patient had been admitted for less than 24 hours that the patient is considered by the medical examiner for autopsy which would likely to be the case.  Comfort care orders placed, will plan for a palliative extubation.  Randy Laela Deviney, MD [facility] Pulmonology/Critical Care Pager [phone] After hours pager: [phone]

## 2017-03-31 NOTE — Progress Notes (Signed)
  Echocardiogram 2D Echocardiogram has been performed.  Randy Burgess G Randy Burgess 2018/01/22, 10:36 AM

## 2017-03-31 NOTE — Progress Notes (Signed)
Responded to page from a nurse to support to staff. Patient being extubated.  Patient requested prayer before extubation.  Prayer was done. Chaplain Ray, and Casimiro NeedleMichael supported me at bedside.  Breck CoonsIntek Davin Archuletta

## 2017-03-31 NOTE — ED Triage Notes (Signed)
[facility] with ROSC; pt LSW by staff at 2345, at 0027 pt found by staff to be pulseless and apnic, CPR initiated and ongoing by first responders and EMS; EMS reports 10 mins of CPR and 3 rounds of epi given before ROSC, PEA then  ST on monitor with strong femoral pulse, capno reading 52, BP 210/110; I/O to L prox tibia Reportedly [facility] from [facility] yesterday [facility] s/p R hip arthroplasty (surgery on 03/15/17)

## 2017-03-31 NOTE — Progress Notes (Addendum)
CRITICAL VALUE ALERT  Critical Value:  Lactic Acid 2.6   Date & Time Notied:  12/31/17 0626  Provider Notified: Dr. Darrick Pennaeterding  Orders Received/Actions taken: No new orders at this time.

## 2017-03-31 NOTE — Progress Notes (Signed)
CRITICAL VALUE ALERT  Critical Value:  Troponin 0.03   Date & Time Notied:  Jul 04, 2017  Provider Notified: Dr. Darrick Pennaeterding   Orders Received/Actions taken: No new orders received

## 2017-03-31 NOTE — Code Documentation (Signed)
1st attempt to intubate unsuccessful, BVM assisted resp initiated

## 2017-03-31 NOTE — Progress Notes (Signed)
Patient extubated with the end of life sustaining measures protocol to RA. RN at bedside.

## 2017-03-31 NOTE — Progress Notes (Signed)
TOD 1751. Patient's POA not present, he did not want be here at time of death. Chaplain services were called to pray for patient prior to extubation. I remained at bedside with the patient post extubation until his passing. POA notified of patient's passing.

## 2017-03-31 DEATH — deceased

## 2017-04-01 LAB — CULTURE, RESPIRATORY W GRAM STAIN: Culture: NORMAL

## 2017-04-01 LAB — CULTURE, RESPIRATORY

## 2017-04-04 LAB — CULTURE, BLOOD (ROUTINE X 2)
CULTURE: NO GROWTH
Culture: NO GROWTH
SPECIAL REQUESTS: ADEQUATE
Special Requests: ADEQUATE

## 2017-04-05 ENCOUNTER — Telehealth: Payer: Self-pay | Admitting: *Deleted

## 2017-04-05 NOTE — Telephone Encounter (Signed)
Copied from CRM [id number]. Topic: General - Other >> Apr 04, 2017  2:52 PM Gerrianne ScalePayne, Angela L wrote: Reason for CRM: Aneta [facility]Nancy Sw[facility] [place] [phone] calling to see if Dr Milinda CaveMcGowen would sign patients Death Certificate

## 2017-04-05 NOTE — Telephone Encounter (Signed)
Death certificate needs to be signed by his orthopedic surgeon.-thx

## 2017-04-05 NOTE — Telephone Encounter (Signed)
Pt advised and voiced understanding.   

## 2017-04-06 NOTE — Telephone Encounter (Signed)
Spoke to [facility], Orthopedic surgeon refused to sign death certificate but the attending physician at hospital during patient's time of death signed certificate.

## 2018-10-31 IMAGING — CT CT HEAD W/O CM
4 series · 17 of 47 positions shown, 19 images · non-contrast
Comparison: None.

CLINICAL DATA: 72-year-old male with altered mental status.

EXAM:
CT HEAD WITHOUT CONTRAST
TECHNIQUE: Contiguous axial images were obtained from the base of the skull
through the vertex without intravenous contrast.

[Series 3: head without · axial · non-contrast · 0.46mm/px · z∈[-106,+19]mm · 7 of 35 slices shown, 9 images]
[im 5/35  brain]
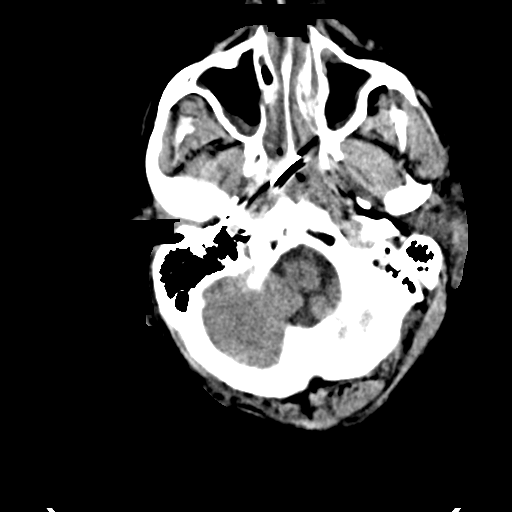
[im 5/35  bone]
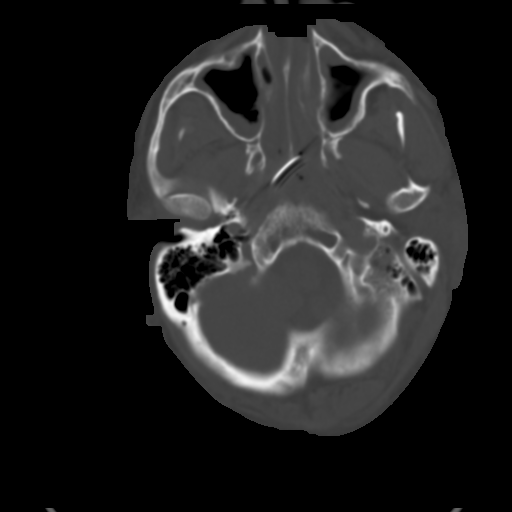
[im 9/35  brain]
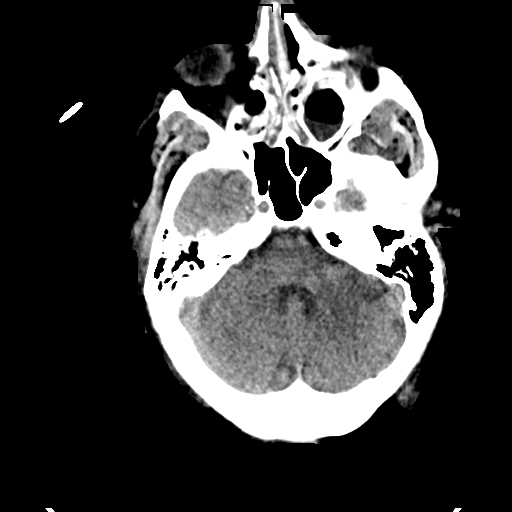
[im 13/35  brain]
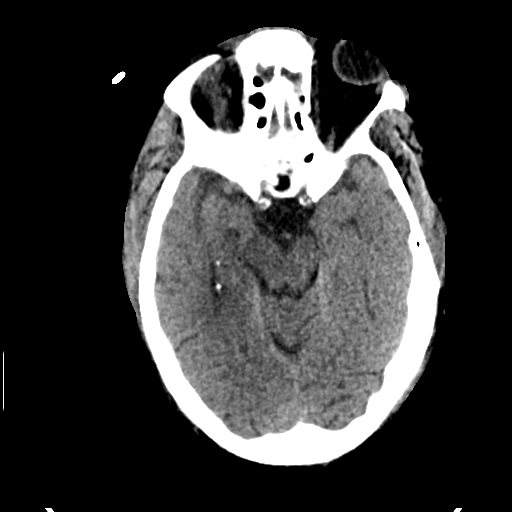
[im 18/35  brain]
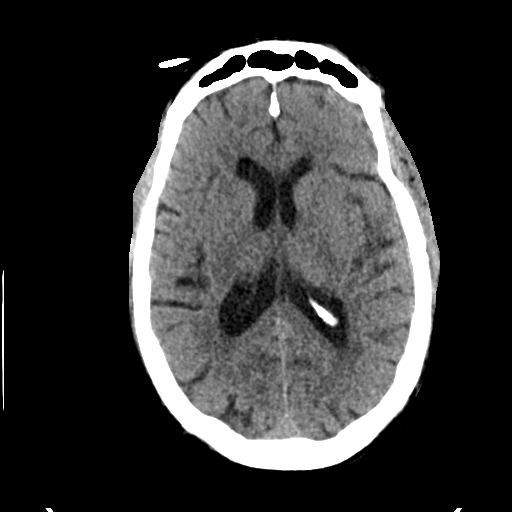
[im 22/35  brain]
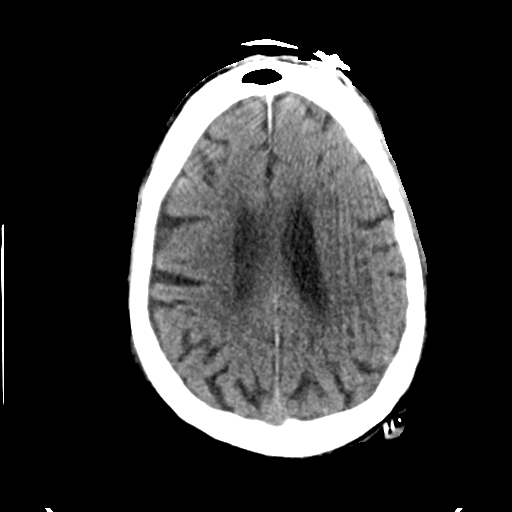
[im 22/35  bone]
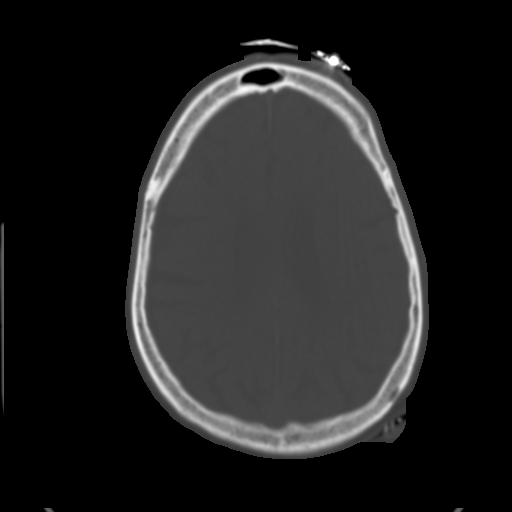
[im 26/35  brain]
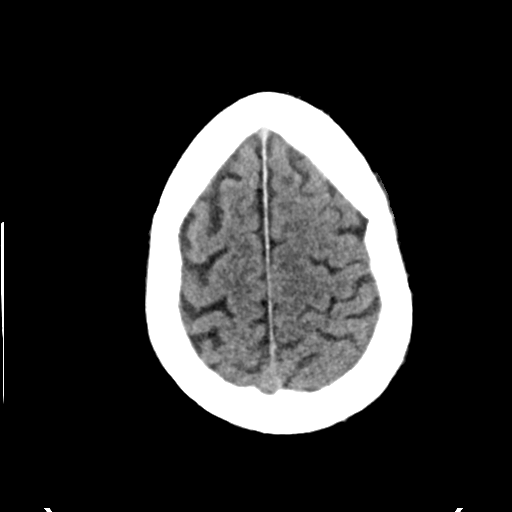
[im 30/35  brain]
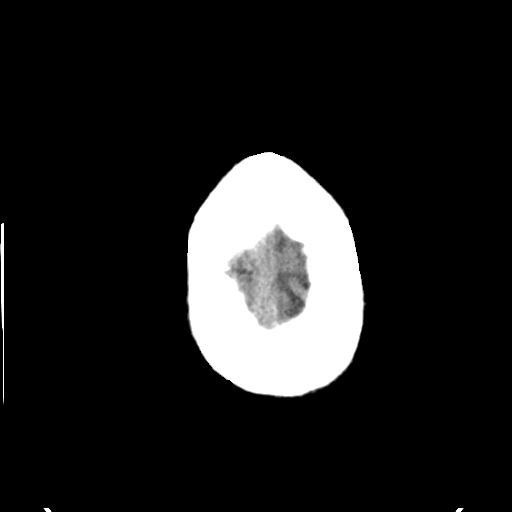

[Series 4: head bone · axial · 0.46mm/px · z∈[-110,-48]mm · 4 of 88 slices shown]
[im 9/88  bone]
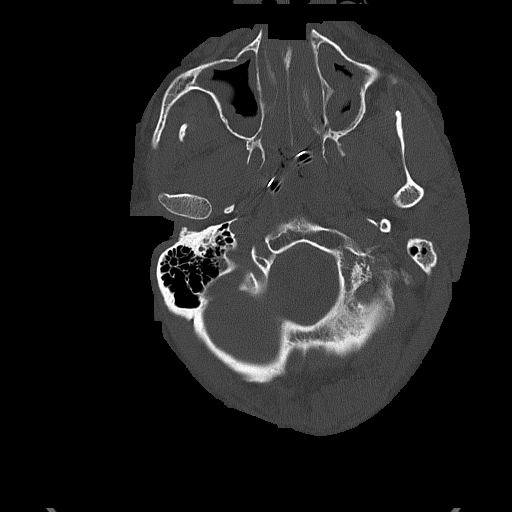
[im 18/88  bone]
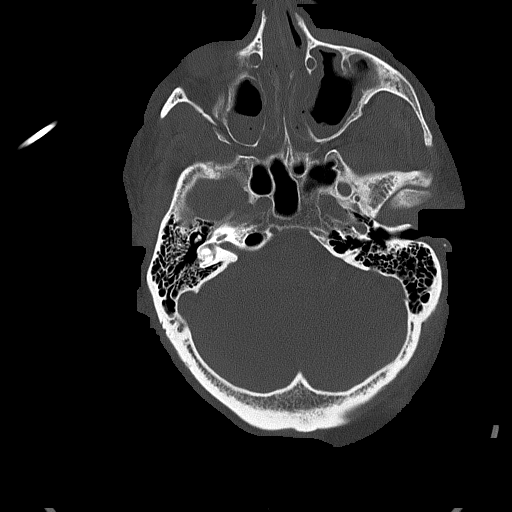
[im 27/88  bone]
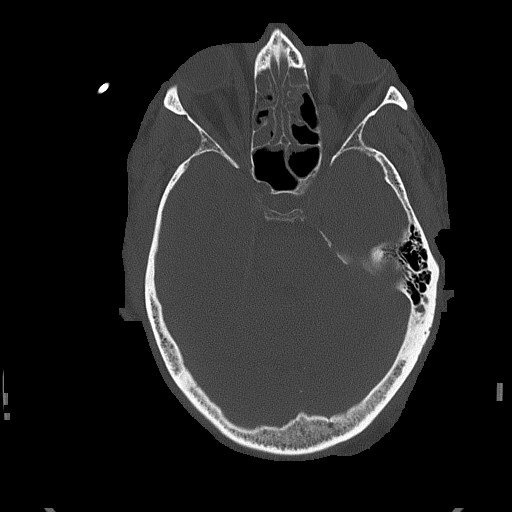
[im 40/88  bone]
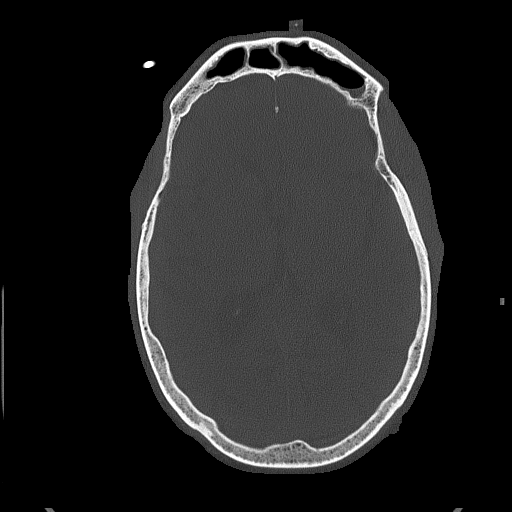

[Series 5: head without cor · coronal · non-contrast · 0.35mm/px · 3 of 74 slices shown]
[im 25/74  brain]
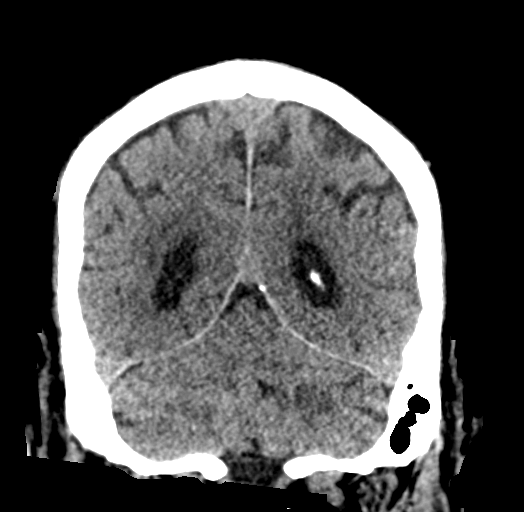
[im 33/74  brain]
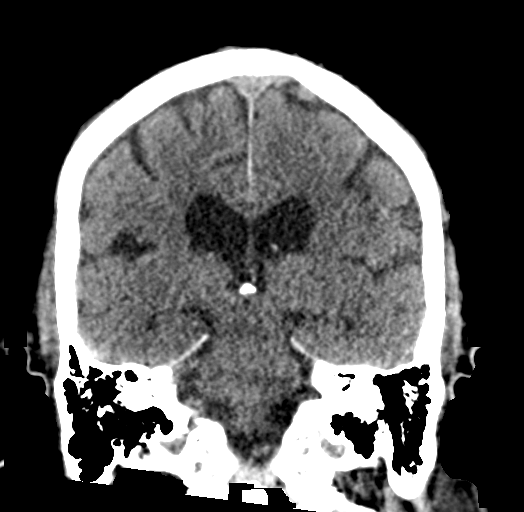
[im 41/74  brain]
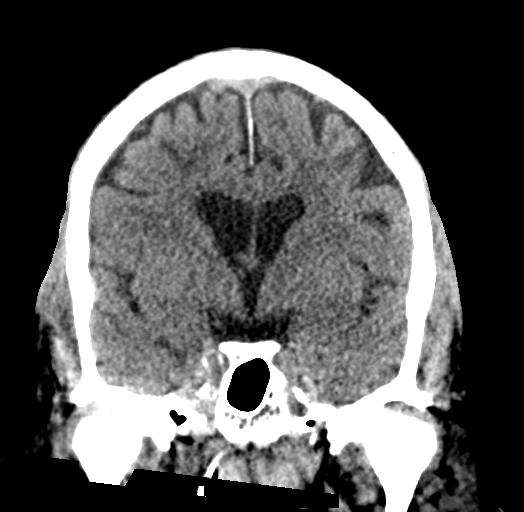

[Series 6: head without sag · sagittal · non-contrast · 0.37mm/px · 3 of 59 slices shown]
[im 21/59  brain]
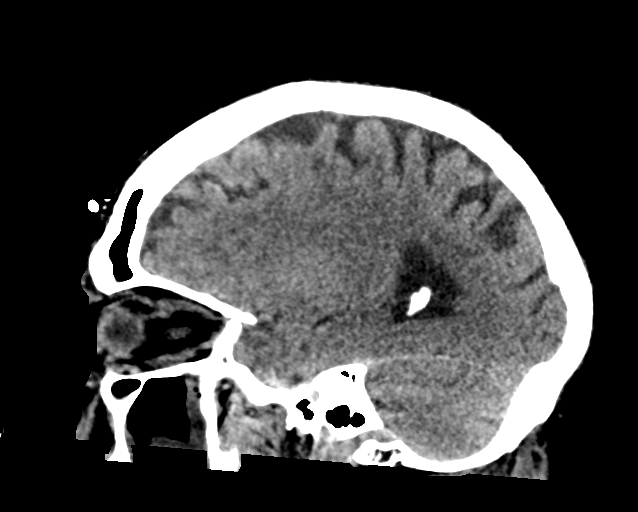
[im 30/59  brain]
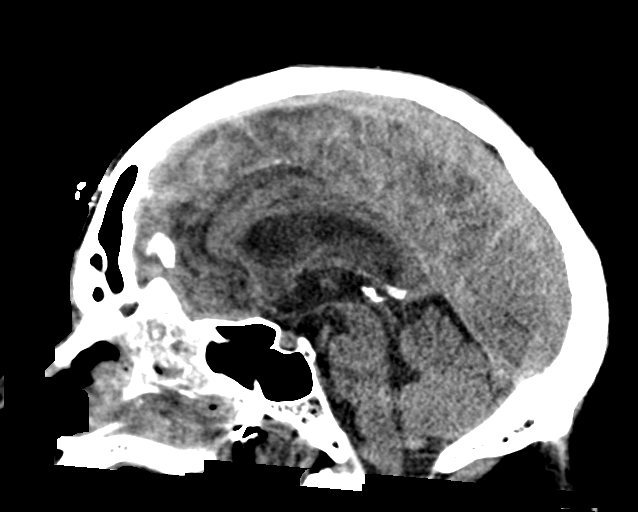
[im 39/59  brain]
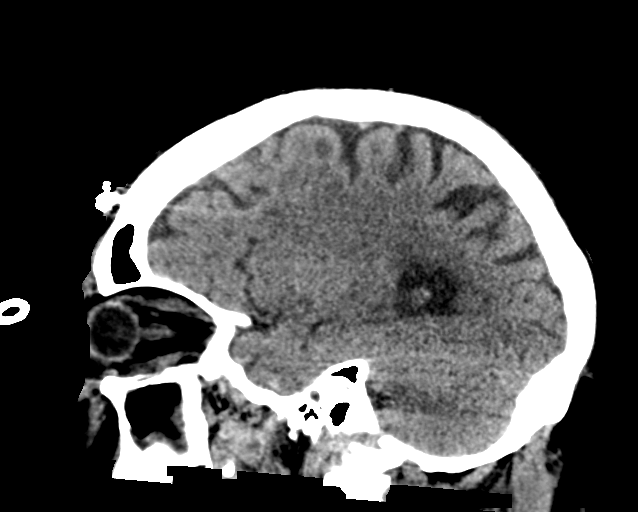

[17 of 47 positions shown; findings below may reference images not displayed]

FINDINGS: Brain: There is mild age-related atrophy and chronic microvascular
ischemic changes. There is no acute intracranial hemorrhage. No mass
effect or midline shift. No extra-axial fluid collection.

Vascular: No hyperdense vessel or unexpected calcification.

Skull: Normal. Negative for fracture or focal lesion.

Sinuses/Orbits: Severe diffuse mucoperiosteal thickening of
paranasal sinuses with opacification of the ethmoid air cells and
nasal passages. The mastoid air cells are clear.

Other: An enteric tube is partially visualized in the oropharynx.
IMPRESSION: 1. No acute intracranial hemorrhage.
2. Mild age-related atrophy and chronic microvascular ischemic
changes.
